# Patient Record
Sex: Female | Born: 1990 | Race: Asian | Hispanic: No | Marital: Married | Smoking: Never smoker
Health system: Southern US, Community
[De-identification: ages and names within clinical notes are randomized; demographics above are authoritative.]

## PROBLEM LIST (undated history)

## (undated) ENCOUNTER — Inpatient Hospital Stay (HOSPITAL_COMMUNITY): Payer: Self-pay

## (undated) DIAGNOSIS — K219 Gastro-esophageal reflux disease without esophagitis: Secondary | ICD-10-CM

## (undated) DIAGNOSIS — E785 Hyperlipidemia, unspecified: Secondary | ICD-10-CM

## (undated) DIAGNOSIS — K649 Unspecified hemorrhoids: Secondary | ICD-10-CM

## (undated) DIAGNOSIS — O24419 Gestational diabetes mellitus in pregnancy, unspecified control: Secondary | ICD-10-CM

## (undated) DIAGNOSIS — F419 Anxiety disorder, unspecified: Secondary | ICD-10-CM

## (undated) DIAGNOSIS — L309 Dermatitis, unspecified: Secondary | ICD-10-CM

## (undated) HISTORY — DX: Hyperlipidemia, unspecified: E78.5

## (undated) HISTORY — PX: BAND HEMORRHOIDECTOMY: SHX1213

## (undated) HISTORY — DX: Gastro-esophageal reflux disease without esophagitis: K21.9

## (undated) HISTORY — DX: Anxiety disorder, unspecified: F41.9

## (undated) HISTORY — DX: Unspecified hemorrhoids: K64.9

## (undated) HISTORY — DX: Dermatitis, unspecified: L30.9

## (undated) HISTORY — PX: ESOPHAGOGASTRODUODENOSCOPY: SHX1529

## (undated) HISTORY — DX: Gestational diabetes mellitus in pregnancy, unspecified control: O24.419

---

## 2011-01-25 ENCOUNTER — Other Ambulatory Visit (HOSPITAL_COMMUNITY): Payer: Self-pay | Admitting: Nurse Practitioner

## 2011-01-25 DIAGNOSIS — Z3689 Encounter for other specified antenatal screening: Secondary | ICD-10-CM

## 2011-01-25 LAB — HEPATITIS B SURFACE ANTIGEN: Hepatitis B Surface Ag: NEGATIVE

## 2011-01-25 LAB — RUBELLA ANTIBODY, IGM: Rubella: IMMUNE

## 2011-01-25 LAB — GC/CHLAMYDIA PROBE AMP, GENITAL
Chlamydia: NEGATIVE
Gonorrhea: NEGATIVE

## 2011-01-25 LAB — RPR: RPR: NONREACTIVE

## 2011-01-25 LAB — ABO/RH: RH Type: POSITIVE

## 2011-01-25 LAB — HIV ANTIBODY (ROUTINE TESTING W REFLEX): HIV: NONREACTIVE

## 2011-01-25 LAB — ANTIBODY SCREEN: Antibody Screen: NEGATIVE

## 2011-02-07 ENCOUNTER — Ambulatory Visit (HOSPITAL_COMMUNITY)
Admission: RE | Admit: 2011-02-07 | Discharge: 2011-02-07 | Disposition: A | Discharge status: Home / Self Care | Payer: Medicaid Other | Source: Ambulatory Visit | Attending: Nurse Practitioner | Admitting: Nurse Practitioner

## 2011-02-07 DIAGNOSIS — Z1389 Encounter for screening for other disorder: Secondary | ICD-10-CM | POA: Insufficient documentation

## 2011-02-07 DIAGNOSIS — Z363 Encounter for antenatal screening for malformations: Secondary | ICD-10-CM | POA: Insufficient documentation

## 2011-02-07 DIAGNOSIS — Z3689 Encounter for other specified antenatal screening: Secondary | ICD-10-CM

## 2011-02-07 DIAGNOSIS — O358XX0 Maternal care for other (suspected) fetal abnormality and damage, not applicable or unspecified: Secondary | ICD-10-CM | POA: Insufficient documentation

## 2011-06-09 LAB — STREP B DNA PROBE: GBS: NEGATIVE

## 2011-07-06 ENCOUNTER — Inpatient Hospital Stay (HOSPITAL_COMMUNITY): Payer: Medicaid Other

## 2011-07-06 ENCOUNTER — Encounter (HOSPITAL_COMMUNITY): Payer: Self-pay | Admitting: *Deleted

## 2011-07-06 ENCOUNTER — Telehealth (HOSPITAL_COMMUNITY): Payer: Self-pay | Admitting: *Deleted

## 2011-07-06 ENCOUNTER — Inpatient Hospital Stay (HOSPITAL_COMMUNITY)
Admission: AD | Admit: 2011-07-06 | Discharge: 2011-07-06 | Disposition: A | Discharge status: Home / Self Care | Payer: Medicaid Other | Source: Ambulatory Visit | Attending: Family Medicine | Admitting: Family Medicine

## 2011-07-06 DIAGNOSIS — IMO0002 Reserved for concepts with insufficient information to code with codable children: Secondary | ICD-10-CM

## 2011-07-06 DIAGNOSIS — O139 Gestational [pregnancy-induced] hypertension without significant proteinuria, unspecified trimester: Secondary | ICD-10-CM | POA: Insufficient documentation

## 2011-07-06 LAB — COMPREHENSIVE METABOLIC PANEL
ALT: 20 U/L (ref 0–35)
AST: 24 U/L (ref 0–37)
Albumin: 3 g/dL — ABNORMAL LOW (ref 3.5–5.2)
Alkaline Phosphatase: 165 U/L — ABNORMAL HIGH (ref 39–117)
BUN: 6 mg/dL (ref 6–23)
CO2: 23 mEq/L (ref 19–32)
Calcium: 9.2 mg/dL (ref 8.4–10.5)
Chloride: 106 mEq/L (ref 96–112)
Creatinine, Ser: 0.4 mg/dL — ABNORMAL LOW (ref 0.50–1.10)
GFR calc Af Amer: >90 mL/min (ref >90)
GFR calc non Af Amer: >90 mL/min (ref >90)
Glucose, Bld: 71 mg/dL (ref 70–99)
Potassium: 3.4 mEq/L — ABNORMAL LOW (ref 3.5–5.1)
Sodium: 140 mEq/L (ref 135–145)
Total Bilirubin: 0.3 mg/dL (ref 0.3–1.2)
Total Protein: 6.4 g/dL (ref 6.0–8.3)

## 2011-07-06 LAB — CBC
HCT: 36.6 % (ref 36.0–46.0)
Hemoglobin: 12 g/dL (ref 12.0–15.0)
MCH: 27.8 pg (ref 26.0–34.0)
MCHC: 32.8 g/dL (ref 30.0–36.0)
MCV: 84.9 fL (ref 78.0–100.0)
Platelets: 187 10*3/uL (ref 150–400)
RBC: 4.31 MIL/uL (ref 3.87–5.11)
RDW: 14.5 % (ref 11.5–15.5)
WBC: 11.9 10*3/uL — ABNORMAL HIGH (ref 4.0–10.5)

## 2011-07-06 LAB — PROTEIN / CREATININE RATIO, URINE
Creatinine, Urine: 50 mg/dL
Protein Creatinine Ratio: 0.13 (ref 0.00–0.15)
Total Protein, Urine: 6.4 mg/dL

## 2011-07-06 NOTE — Discharge Instructions (Signed)
Hypertension During Pregnancy Hypertension is also called high blood pressure. Blood pressure moves blood in your body. Sometimes, the force that moves the blood becomes too strong. When you are pregnant, this condition should be watched carefully. It can cause problems for you and your baby. HOME CARE   Make and keep all of your doctor visits.   Take medicine as told by your doctor. Tell your doctor about all medicines you take.   Eat very little salt.   Exercise regularly.   Do not drink alcohol.   Do not smoke.   Do not have drinks with caffeine.   Lie on your left side when resting.  GET HELP RIGHT AWAY IF:  You have bad belly (abdominal) pain.   You have sudden puffiness (swelling) in the hands, ankles, or face.   You gain 4 pounds (1.8 kilograms) or more in 1 week.   You throw up (vomit) repeatedly.   You have bleeding from the vagina.   You do not feel the baby moving as much.   You have a headache.   You have blurred or double vision.   You have muscle twitching or spasms.   You have shortness of breath.   You have blue fingernails and lips.   You have blood in your pee (urine).  MAKE SURE YOU:  Understand these instructions.   Will watch your condition.   Will get help right away if you are not doing well.  Document Released: 04/08/2010 Document Revised: 02/23/2011 Document Reviewed: 10/21/2010 [employer] Patient Information 2012 [employer]. 

## 2011-07-06 NOTE — MAU Provider Note (Signed)
History     CSN: [id number]  Arrival date and time: 07/06/11 1131   First Provider Initiated Contact with Patient 07/06/11 1201      Chief Complaint  Patient presents with  . Hypertension   HPI 21 year old G1 P0 at 40 weeks and one-day who was sent to the maternity admission unit from the [facility] department due to concerns of pregnancy-induced hypertension. She denies vision changes, headache, right upper quadrant pain, peripheral edema. She reports good fetal activity with no evidence of vaginal bleeding, vaginal discharge, leaking fluid.  OB History    Grav Para Term Preterm Abortions TAB SAB Ect Mult Living   1               Past Medical History  Diagnosis Date  . Eczema     Past Surgical History  Procedure Date  . No past surgeries     No family history on file.  History  Substance Use Topics  . Smoking status: Never Smoker   . Smokeless tobacco: Never Used  . Alcohol Use: No    Allergies: No Known Allergies  Prescriptions prior to admission  Medication Sig Dispense Refill  . Prenatal Vit-Fe Fumarate-FA (PRENATAL MULTIVITAMIN) TABS Take 1 tablet by mouth at bedtime.        Review of Systems  Constitutional: Negative for fever and chills.  Gastrointestinal: Negative for nausea, vomiting and abdominal pain.  Genitourinary: Negative for dysuria, urgency and frequency.   Physical Exam   Blood pressure 123/82, pulse 76, temperature 97.3 F (36.3 C), temperature source Oral, resp. rate 20, height 5\' 1"  (1.549 m), last menstrual period 09/28/2010.  Physical Exam  Constitutional: She is oriented to person, place, and time. She appears well-developed and well-nourished.  GI: Soft. Bowel sounds are normal. She exhibits no distension and no mass. There is no tenderness. There is no rebound and no guarding.  Musculoskeletal: Normal range of motion. She exhibits no edema.  Neurological: She is alert and oriented to person, place, and time. She has  normal reflexes.  Skin: Skin is warm and dry.  Psychiatric: She has a normal mood and affect. Her behavior is normal. Judgment and thought content normal.   Results for orders placed during the hospital encounter of 07/06/11 (from the past 24 hour(s))  PROTEIN / CREATININE RATIO, URINE     Status: Normal   Collection Time   07/06/11 11:50 AM      Component Value Range   Creatinine, Urine 50.00     Total Protein, Urine 6.4     PROTEIN CREATININE RATIO 0.13  0.00 - 0.15   COMPREHENSIVE METABOLIC PANEL     Status: Abnormal   Collection Time   07/06/11 12:20 PM      Component Value Range   Sodium 140  135 - 145 (mEq/L)   Potassium 3.4 (*) 3.5 - 5.1 (mEq/L)   Chloride 106  96 - 112 (mEq/L)   CO2 23  19 - 32 (mEq/L)   Glucose, Bld 71  70 - 99 (mg/dL)   BUN 6  6 - 23 (mg/dL)   Creatinine, Ser 9.14 (*) 0.50 - 1.10 (mg/dL)   Calcium 9.2  8.4 - 78.2 (mg/dL)   Total Protein 6.4  6.0 - 8.3 (g/dL)   Albumin 3.0 (*) 3.5 - 5.2 (g/dL)   AST 24  0 - 37 (U/L)   ALT 20  0 - 35 (U/L)   Alkaline Phosphatase 165 (*) 39 - 117 (U/L)   Total  Bilirubin 0.3  0.3 - 1.2 (mg/dL)   GFR calc non Af Amer >90  >90 (mL/min)   GFR calc Af Amer >90  >90 (mL/min)  CBC     Status: Abnormal   Collection Time   07/06/11 12:20 PM      Component Value Range   WBC 11.9 (*) 4.0 - 10.5 (K/uL)   RBC 4.31  3.87 - 5.11 (MIL/uL)   Hemoglobin 12.0  12.0 - 15.0 (g/dL)   HCT 16.1  09.6 - 04.5 (%)   MCV 84.9  78.0 - 100.0 (fL)   MCH 27.8  26.0 - 34.0 (pg)   MCHC 32.8  30.0 - 36.0 (g/dL)   RDW 40.9  81.1 - 91.4 (%)   Platelets 187  150 - 400 (K/uL)    MAU Course  Procedures NST: category 1 tracing with baseline rate of 120s-130s.  Multiple accelerations seen. BPP: 8/8  MDM Her for almost 2 hours with normal blood pressures. Lab testing was negative for preeclampsia. Patient discharged to home to followup with her provider.  Assessment and Plan  #1 intrauterine pregnancy at 40 weeks and 1 day #2 pregnancy-induced  hypertension-episodic  No evidence of preeclampsia. Followup with primary provider at next scheduled appointment. Discharged to home.  Conswella Bruney JEHIEL 07/06/2011, 1:26 PM

## 2011-07-06 NOTE — Telephone Encounter (Signed)
Preadmission screen [phone]

## 2011-07-06 NOTE — MAU Note (Signed)
Sent from HD, elevated BP.  Denies headache, no pain, no visual changes.  Little swelling in hands and feet.

## 2011-07-12 ENCOUNTER — Encounter (HOSPITAL_COMMUNITY): Payer: Self-pay

## 2011-07-12 ENCOUNTER — Inpatient Hospital Stay (HOSPITAL_COMMUNITY)
Admission: AD | Admit: 2011-07-12 | Discharge: 2011-07-14 | DRG: 775 | Disposition: A | Discharge status: Home / Self Care | Payer: Medicaid Other | Source: Ambulatory Visit | Attending: Obstetrics & Gynecology | Admitting: Obstetrics & Gynecology

## 2011-07-12 ENCOUNTER — Other Ambulatory Visit: Payer: Medicaid Other

## 2011-07-12 DIAGNOSIS — Z3400 Encounter for supervision of normal first pregnancy, unspecified trimester: Secondary | ICD-10-CM

## 2011-07-12 LAB — CBC
HCT: 37 % (ref 36.0–46.0)
Hemoglobin: 12.5 g/dL (ref 12.0–15.0)
MCH: 28.5 pg (ref 26.0–34.0)
MCHC: 33.8 g/dL (ref 30.0–36.0)
MCV: 84.3 fL (ref 78.0–100.0)
Platelets: 202 10*3/uL (ref 150–400)
RBC: 4.39 MIL/uL (ref 3.87–5.11)
RDW: 14.6 % (ref 11.5–15.5)
WBC: 15 10*3/uL — ABNORMAL HIGH (ref 4.0–10.5)

## 2011-07-12 LAB — ABO/RH: ABO/RH(D): O POS

## 2011-07-12 LAB — RPR: RPR Ser Ql: NONREACTIVE

## 2011-07-12 MED ORDER — IBUPROFEN 600 MG PO TABS: 600.0000 mg | ORAL_TABLET | Freq: Four times a day (QID) | ORAL | Status: DC | PRN

## 2011-07-12 MED ORDER — LACTATED RINGERS IV SOLN: 500.0000 mL | Freq: Once | INTRAVENOUS | Status: DC

## 2011-07-12 MED ORDER — PHENYLEPHRINE 40 MCG/ML (10ML) SYRINGE FOR IV PUSH (FOR BLOOD PRESSURE SUPPORT): 80.0000 ug | PREFILLED_SYRINGE | INTRAVENOUS | Status: DC | PRN

## 2011-07-12 MED ORDER — FLEET ENEMA 7-19 GM/118ML RE ENEM: 1.0000 | ENEMA | RECTAL | Status: DC | PRN

## 2011-07-12 MED ORDER — BENZOCAINE-MENTHOL 20-0.5 % EX AERO
1.0000 "application " | INHALATION_SPRAY | CUTANEOUS | Status: DC | PRN
  Administered 2011-07-12: 1 via TOPICAL
  Filled 2011-07-12: qty 56

## 2011-07-12 MED ORDER — LACTATED RINGERS IV SOLN: 500.0000 mL | INTRAVENOUS | Status: DC | PRN

## 2011-07-12 MED ORDER — LANOLIN HYDROUS EX OINT: TOPICAL_OINTMENT | CUTANEOUS | Status: DC | PRN

## 2011-07-12 MED ORDER — OXYCODONE-ACETAMINOPHEN 5-325 MG PO TABS: 1.0000 | ORAL_TABLET | ORAL | Status: DC | PRN

## 2011-07-12 MED ORDER — LIDOCAINE HCL (PF) 1 % IJ SOLN
30.0000 mL | INTRAMUSCULAR | Status: DC | PRN
  Filled 2011-07-12: qty 30

## 2011-07-12 MED ORDER — ONDANSETRON HCL 4 MG/2ML IJ SOLN: 4.0000 mg | Freq: Four times a day (QID) | INTRAMUSCULAR | Status: DC | PRN

## 2011-07-12 MED ORDER — NALBUPHINE SYRINGE 5 MG/0.5 ML
10.0000 mg | INJECTION | INTRAMUSCULAR | Status: DC | PRN
  Administered 2011-07-12 (×2): 10 mg via INTRAVENOUS
  Filled 2011-07-12 (×2): qty 1

## 2011-07-12 MED ORDER — CITRIC ACID-SODIUM CITRATE 334-500 MG/5ML PO SOLN: 30.0000 mL | ORAL | Status: DC | PRN

## 2011-07-12 MED ORDER — OXYCODONE-ACETAMINOPHEN 5-325 MG PO TABS
1.0000 | ORAL_TABLET | ORAL | Status: DC | PRN
  Administered 2011-07-12 – 2011-07-14 (×3): 1 via ORAL
  Filled 2011-07-12 (×2): qty 1

## 2011-07-12 MED ORDER — PRENATAL MULTIVITAMIN CH
1.0000 | ORAL_TABLET | Freq: Every day | ORAL | Status: DC
  Administered 2011-07-12 – 2011-07-14 (×3): 1 via ORAL
  Filled 2011-07-12 (×3): qty 1

## 2011-07-12 MED ORDER — TETANUS-DIPHTH-ACELL PERTUSSIS 5-2.5-18.5 LF-MCG/0.5 IM SUSP: 0.5000 mL | Freq: Once | INTRAMUSCULAR | Status: DC

## 2011-07-12 MED ORDER — WITCH HAZEL-GLYCERIN EX PADS: 1.0000 "application " | MEDICATED_PAD | CUTANEOUS | Status: DC | PRN

## 2011-07-12 MED ORDER — OXYTOCIN BOLUS FROM INFUSION
500.0000 mL | Freq: Once | INTRAVENOUS | Status: DC
  Filled 2011-07-12: qty 1000
  Filled 2011-07-12: qty 500

## 2011-07-12 MED ORDER — SENNOSIDES-DOCUSATE SODIUM 8.6-50 MG PO TABS
2.0000 | ORAL_TABLET | Freq: Every day | ORAL | Status: DC
  Administered 2011-07-12: 2 via ORAL

## 2011-07-12 MED ORDER — DIPHENHYDRAMINE HCL 50 MG/ML IJ SOLN: 12.5000 mg | INTRAMUSCULAR | Status: DC | PRN

## 2011-07-12 MED ORDER — IBUPROFEN 600 MG PO TABS
600.0000 mg | ORAL_TABLET | Freq: Four times a day (QID) | ORAL | Status: DC
  Administered 2011-07-12 – 2011-07-14 (×7): 600 mg via ORAL
  Filled 2011-07-12 (×8): qty 1

## 2011-07-12 MED ORDER — OXYTOCIN 20 UNITS IN LACTATED RINGERS INFUSION - SIMPLE
125.0000 mL/h | Freq: Once | INTRAVENOUS | Status: AC
  Administered 2011-07-12: 500 mL/h via INTRAVENOUS

## 2011-07-12 MED ORDER — EPHEDRINE 5 MG/ML INJ: 10.0000 mg | INTRAVENOUS | Status: DC | PRN

## 2011-07-12 MED ORDER — ONDANSETRON HCL 4 MG/2ML IJ SOLN: 4.0000 mg | INTRAMUSCULAR | Status: DC | PRN

## 2011-07-12 MED ORDER — SIMETHICONE 80 MG PO CHEW
80.0000 mg | CHEWABLE_TABLET | ORAL | Status: DC | PRN
  Administered 2011-07-12: 80 mg via ORAL

## 2011-07-12 MED ORDER — ZOLPIDEM TARTRATE 5 MG PO TABS: 5.0000 mg | ORAL_TABLET | Freq: Every evening | ORAL | Status: DC | PRN

## 2011-07-12 MED ORDER — LACTATED RINGERS IV SOLN
INTRAVENOUS | Status: DC
Start: 2011-07-12 — End: 2011-07-12
  Administered 2011-07-12 (×2): via INTRAVENOUS

## 2011-07-12 MED ORDER — DIPHENHYDRAMINE HCL 25 MG PO CAPS: 25.0000 mg | ORAL_CAPSULE | Freq: Four times a day (QID) | ORAL | Status: DC | PRN

## 2011-07-12 MED ORDER — DIBUCAINE 1 % RE OINT: 1.0000 "application " | TOPICAL_OINTMENT | RECTAL | Status: DC | PRN

## 2011-07-12 MED ORDER — FENTANYL 2.5 MCG/ML BUPIVACAINE 1/10 % EPIDURAL INFUSION (WH - ANES): 14.0000 mL/h | INTRAMUSCULAR | Status: DC

## 2011-07-12 MED ORDER — ONDANSETRON HCL 4 MG PO TABS: 4.0000 mg | ORAL_TABLET | ORAL | Status: DC | PRN

## 2011-07-12 MED ORDER — ACETAMINOPHEN 325 MG PO TABS: 650.0000 mg | ORAL_TABLET | ORAL | Status: DC | PRN

## 2011-07-12 NOTE — Progress Notes (Addendum)
Patient ID: Paula Stark, female   DOB: 1990-07-05, 21 y.o.   MRN: [id number] Delivery Note At  a viable female baby was delivered via  Vertex SVD.  APGAR: 9, 9; weight - not documented for one hour. Check flowsheet.    Placenta status: intact , . 3 veseel Cord:  with the following complications: none.  Cord pH: not drawn.   Anesthesia: none Episiotomy: none Lacerations: none Est. Blood Loss (mL): minimal  Mom to postpartum.  Baby to nursery-stable. Delivered by myself and Maylon Cos Midwife.  [name] 07/12/2011, 3:45 PM

## 2011-07-12 NOTE — Progress Notes (Signed)
Brylei Oelkers is a 21 y.o. G1P0000 at [redacted]w[redacted]d   Subjective: Urge to push with contractions  Objective: BP 147/80  Pulse 86  Temp(Src) 97.3 F (36.3 C) (Axillary)  Resp 20  Ht 5\' 1"  (1.549 m)  Wt 157 lb (71.215 kg)  BMI 29.67 kg/m2  LMP 09/28/2010      FHT:  FHR: 120 bpm, variability: moderate,  accelerations:  Present,  decelerations:  Present occassional with pushing, spontaneous return to BL and accelerations b/t ctx UC:   irregular, every 3-4 minutes SVE:   Dilation: 10 Effacement (%): 100 Station: 0;+1 Exam by:: Lorretta Harp RNC  Active pushing, good maternal effort and good decent of fetal heatd   Labs: Lab Results  Component Value Date   WBC 15.0* 07/12/2011   HGB 12.5 07/12/2011   HCT 37.0 07/12/2011   MCV 84.3 07/12/2011   PLT 202 07/12/2011    Assessment / Plan: Active second stage  Labor: Progressing normally Preeclampsia:  n/a Fetal Wellbeing:  Category I Pain Control:  Labor support without medications I/D:  n/a Anticipated MOD:  NSVD  Jules Vidovich E. 07/12/2011, 3:22 PM

## 2011-07-12 NOTE — MAU Note (Addendum)
Pt states starting having contractions last night and questionable rom v/s loss of mucous plug. Presently is not leaking fluid. Denies any problems with her pregnancy.

## 2011-07-12 NOTE — Progress Notes (Signed)
Paula Stark is a 21 y.o. G1P0000 at [redacted]w[redacted]d  Subjective: Doing well. Pain well controlled w/ Nubain. Still not interested in an epidural   Objective: BP 131/77  Pulse 80  Temp(Src) 97.5 F (36.4 C) (Oral)  Resp 20  Ht 5\' 1"  (1.549 m)  Wt 71.215 kg (157 lb)  BMI 29.67 kg/m2  LMP 09/28/2010      FHT:  FHR: 130s-140s bpm, variability: moderate,  accelerations:  Present,  decelerations:  Absent UC:   regular, every 4-6 minutes SVE:   Dilation: 7.5 Effacement (%): 80 Station: 0 Exam by:: Shelly Flatten, MD  Labs: Lab Results  Component Value Date   WBC 15.0* 07/12/2011   HGB 12.5 07/12/2011   HCT 37.0 07/12/2011   MCV 84.3 07/12/2011   PLT 202 07/12/2011    Assessment / Plan: Spontaneous labor, progressing normally  Labor: Progressing normally Fetal Wellbeing:  Category I Pain Control:  Nubain I/D:  n/a Anticipated MOD:  NSVD  Paula Stark 07/12/2011, 10:37 AM

## 2011-07-12 NOTE — Progress Notes (Signed)
Paula Stark is a 21 y.o. G1P0000 at [redacted]w[redacted]d  Subjective: Doing well. Pain controlled w/ Nubain, still having pain.   Objective: BP 148/94  Pulse 84  Temp(Src) 97.7 F (36.5 C) (Oral)  Resp 22  Ht 5\' 1"  (1.549 m)  Wt 71.215 kg (157 lb)  BMI 29.67 kg/m2  LMP 09/28/2010      FHT:  FHR: 130s-140s bpm, variability: moderate,  accelerations:  Present,  decelerations:  Absent UC:   regular, every 4-6 minutes SVE:   Dilation: 7.5 Effacement (%): 80 Station: 0 Exam by:: Shelly Flatten, MD  Labs: Lab Results  Component Value Date   WBC 15.0* 07/12/2011   HGB 12.5 07/12/2011   HCT 37.0 07/12/2011   MCV 84.3 07/12/2011   PLT 202 07/12/2011    Assessment / Plan: Spontaneous labor, progressing normally  Labor: Progressing normally Fetal Wellbeing:  Category I Pain Control:  Nubain I/D:  n/a Anticipated MOD:  NSVD  Will check again in one hour, possible AROM at that time.   [name] 07/12/2011, 12:28 PM

## 2011-07-12 NOTE — H&P (Signed)
Paula Stark is a 21 y.o. female presenting for eval of labor. Denies leak or bldg. Rec's prenatal care at [facility] and her preg has been essentially unremarkable.   History OB History    Grav Para Term Preterm Abortions TAB SAB Ect Mult Living   1              Past Medical History  Diagnosis Date  . Eczema   . No pertinent past medical history    Past Surgical History  Procedure Date  . No past surgeries    Family History: family history is not on file. Social History:  reports that she has never smoked. She has never used smokeless tobacco. She reports that she does not drink alcohol or use illicit drugs.  ROS    Last menstrual period 09/28/2010. Maternal Exam:  Uterine Assessment: Ctx q 4-5 min  Cervix: Per MAU RN 3-4cm/100%  Fetal Exam Fetal Monitor Review: Baseline rate: 135.  Variability: moderate (6-25 bpm).   Pattern: accelerations present and no decelerations.       Physical Exam  Constitutional: She is oriented to person, place, and time. She appears well-developed and well-nourished.  HENT:  Head: Normocephalic.  Cardiovascular: Normal rate.   Respiratory: Effort normal.  Neurological: She is alert and oriented to person, place, and time.  Skin: Skin is warm and dry.  Psychiatric: She has a normal mood and affect. Her behavior is normal.    Prenatal labs: ABO, Rh: O/Positive/-- (11/07 0000) Antibody: Negative (11/07 0000) Rubella: Immune (11/07 0000) RPR: Nonreactive (11/07 0000)  HBsAg: Negative (11/07 0000)  HIV: Non-reactive (11/07 0000)  GBS: Negative (03/22 0000)   Assessment/Plan: IUP at term Early labor  Will admit to L&D Expectant management Anticipate SVD   Kenlee Maler 07/12/2011, 2:20 AM

## 2011-07-12 NOTE — Progress Notes (Signed)
Paula Stark is a 21 y.o. G1P0000 at [redacted]w[redacted]d  Subjective: Breathing with ctx; coping well  Objective: BP 128/76  Pulse 74  Temp(Src) 98 F (36.7 C) (Oral)  Resp 20  Ht 5\' 1"  (1.549 m)  Wt 71.215 kg (157 lb)  BMI 29.67 kg/m2  LMP 09/28/2010      FHT:  FHR: 125 bpm, variability: moderate,  accelerations:  Present,  decelerations:  Absent UC:   regular, every 3-5 minutes SVE:   Dilation: 5 Effacement (%): 100 Station: -1 Exam by:: Pincus Badder CNM  Labs: Lab Results  Component Value Date   WBC 15.0* 07/12/2011   HGB 12.5 07/12/2011   HCT 37.0 07/12/2011   MCV 84.3 07/12/2011   PLT 202 07/12/2011    Assessment / Plan: IUP at term Early active labor  Continue to obs Eval cx in approx 2 hours or sooner prn   Geovanni Rahming 07/12/2011, 5:51 AM

## 2011-07-12 NOTE — Progress Notes (Signed)
Transported to room 160

## 2011-07-12 NOTE — Progress Notes (Signed)
Paula Stark is a 21 y.o. G1P0000 at [redacted]w[redacted]d  Subjective: Doing well. Pain controlled w/ Nubain, still having pain.   Objective: BP 148/94  Pulse 84  Temp(Src) 97.7 F (36.5 C) (Oral)  Resp 22  Ht 5\' 1"  (1.549 m)  Wt 71.215 kg (157 lb)  BMI 29.67 kg/m2  LMP 09/28/2010      FHT:  FHR: 130s-140s bpm, variability: moderate,  accelerations:  Present,  decelerations:  Absent UC:   regular, every 4-6 minutes SVE:   Dilation: 8.0 cm Effacement (%): 80 Station: 0 Soft  AROM performed by [name] - light meconium Exam by:: [name]  Labs: Lab Results  Component Value Date   WBC 15.0* 07/12/2011   HGB 12.5 07/12/2011   HCT 37.0 07/12/2011   MCV 84.3 07/12/2011   PLT 202 07/12/2011    Assessment / Plan: Spontaneous labor, progressing normally S/p AROM  Labor: Progressing normally Fetal Wellbeing:  Category I Pain Control:  Nubain I/D:  n/a Anticipated MOD:  NSVD  Will check again in a couple hours.   [name] 07/12/2011, 1:32 PM

## 2011-07-13 MED ORDER — LANOLIN HYDROUS EX OINT: 1.0000 "application " | TOPICAL_OINTMENT | CUTANEOUS | Status: DC | PRN

## 2011-07-13 MED ORDER — IBUPROFEN 600 MG PO TABS: 600.0000 mg | ORAL_TABLET | Freq: Four times a day (QID) | ORAL | Status: AC

## 2011-07-13 MED ORDER — OXYCODONE-ACETAMINOPHEN 5-325 MG PO TABS: 1.0000 | ORAL_TABLET | Freq: Three times a day (TID) | ORAL | Status: DC | PRN

## 2011-07-13 NOTE — Progress Notes (Signed)
Obstetric Discharge Summary Reason for Admission: onset of labor Prenatal Procedures: none Intrapartum Procedures: spontaneous vaginal delivery Postpartum Procedures: none Complications-Operative and Postpartum: none Hemoglobin Date Value Range Status 07/12/2011 12.5  12.0-15.0 (g/dL) Final   HCT Date Value Range Status 07/12/2011 37.0  36.0-46.0 (%) Final   Physical Exam:  General: alert, cooperative and no distress Lochia: appropriate Uterine Fundus: firm Incision: n/a DVT Evaluation: No evidence of DVT seen on physical exam.  Discharge Diagnoses: Term Pregnancy-delivered  Discharge Information: Date: 07/13/2011 Activity: unrestricted Diet: routine Medications: PNV, Ibuprofen, Percocet and lanolin Condition: stable Instructions: refer to practice specific booklet Discharge to: home Follow-up Information   Follow up with [facility]. Schedule an appointment as soon as possible for a visit in 6 weeks.  Contact information:  [address] [place] [address] [phone]      Newborn Data: Live born female  Birth Weight: 7 lb 8.8 oz (3425 g) APGAR: 9, 9  Home with mother.  [name] 07/13/2011, 7:26 AM  I have seen this patient and agree with the above resident's note.  Pt to stay another day for evaluation by lactation consultant because of poor infant feeding.   LEFTWICH-KIRBY, Ebb Carelock Certified Nurse-Midwife

## 2011-07-13 NOTE — Discharge Summary (Deleted)
Obstetric Discharge Summary Reason for Admission: onset of labor Prenatal Procedures: none Intrapartum Procedures: spontaneous vaginal delivery Postpartum Procedures: none Complications-Operative and Postpartum: none Hemoglobin  Date Value Range Status  07/12/2011 12.5  12.0-15.0 (g/dL) Final     HCT  Date Value Range Status  07/12/2011 37.0  36.0-46.0 (%) Final    Physical Exam:  General: alert, cooperative and no distress Lochia: appropriate Uterine Fundus: firm Incision: n/a DVT Evaluation: No evidence of DVT seen on physical exam.  Discharge Diagnoses: Term Pregnancy-delivered  Discharge Information: Date: 07/13/2011 Activity: unrestricted Diet: routine Medications: PNV, Ibuprofen, Percocet and lanolin Condition: stable Instructions: refer to practice specific booklet Discharge to: home Follow-up Information    Follow up with [facility]. Schedule an appointment as soon as possible for a visit in 6 weeks.   Contact information:   [address] [place] [address] [phone]         Newborn Data: Live born female  Birth Weight: 7 lb 8.8 oz (3425 g) APGAR: 9, 9  Home with mother.  [name] 07/13/2011, 7:26 AM

## 2011-07-13 NOTE — Progress Notes (Signed)
UR Chart review completed.  

## 2011-07-14 MED ORDER — OXYCODONE-ACETAMINOPHEN 5-325 MG PO TABS: 1.0000 | ORAL_TABLET | Freq: Three times a day (TID) | ORAL | Status: AC | PRN

## 2011-07-14 MED ORDER — NORETHINDRONE 0.35 MG PO TABS: 1.0000 | ORAL_TABLET | Freq: Every day | ORAL | Status: DC

## 2011-07-14 NOTE — Progress Notes (Signed)
Post Partum Day 2 Subjective: no complaints, up ad lib, voiding and tolerating PO Decreasing vaginal bleeding.  Objective: Blood pressure 131/78, pulse 81, temperature 97.6 F (36.4 C), temperature source Axillary, resp. rate 18, height 5\' 1"  (1.549 m), weight 71.215 kg (157 lb), last menstrual period 09/28/2010, unknown if currently breastfeeding.  Physical Exam:  General: alert and cooperative Lochia: appropriate Uterine Fundus: firm Incision: n/a DVT Evaluation: No evidence of DVT seen on physical exam.   Basename 07/12/11 0230  HGB 12.5  HCT 37.0    Assessment/Plan: Discharge home Breast.    LOS: 2 days   [name] 07/14/2011, 7:22 AM    Patient seen and examined.  Agree with above note.  Candelaria Celeste JEHIEL 07/14/2011 3:26 PM

## 2011-07-14 NOTE — Discharge Summary (Signed)
Obstetric Discharge Summary Reason for Admission: onset of labor Prenatal Procedures: ultrasound Intrapartum Procedures: spontaneous vaginal delivery Postpartum Procedures: none Complications-Operative and Postpartum: none Hemoglobin  Date Value Range Status  07/12/2011 12.5  12.0-15.0 (g/dL) Final     HCT  Date Value Range Status  07/12/2011 37.0  36.0-46.0 (%) Final    Physical Exam:  General: alert and cooperative Lochia: appropriate Uterine Fundus: firm Incision: n/a DVT Evaluation: No evidence of DVT seen on physical exam.  Discharge Diagnoses: Term Pregnancy-delivered  Discharge Information: Date: 07/14/2011 Activity: unrestricted Diet: routine Medications: PNV, Ibuprofen and Percocet Condition: stable Instructions: refer to practice specific booklet Discharge to: home Follow-up Information    Follow up with [facility]. Schedule an appointment as soon as possible for a visit in 6 weeks.   Contact information:   [address] [place] [address] [phone]         Newborn Data: Live born female  Birth Weight: 7 lb 8.8 oz (3425 g) APGAR: 9, 9  Home with mother.  Paula Stark 07/14/2011, 7:23 AM Obstetric Discharge Summary Reason for Admission: onset of labor Prenatal Procedures: none Intrapartum Procedures: spontaneous vaginal delivery Postpartum Procedures: none Complications-Operative and Postpartum: none Hemoglobin  Date Value Range Status  07/12/2011 12.5  12.0-15.0 (g/dL) Final     HCT  Date Value Range Status  07/12/2011 37.0  36.0-46.0 (%) Final    Physical Exam:  General: alert, cooperative and no distress Lochia: appropriate Uterine Fundus: firm Incision: n/a DVT Evaluation: No evidence of DVT seen on physical exam.  Discharge Diagnoses: Term Pregnancy-delivered  Discharge Information: Date: 07/14/2011 Activity: unrestricted Diet: routine Medications: PNV, Ibuprofen, Percocet and lanolin Condition:  stable Instructions: refer to practice specific booklet Discharge to: home Follow-up Information    Follow up with [facility]. Schedule an appointment as soon as possible for a visit in 6 weeks.   Contact information:   [address] [place] [address] [phone]         Newborn Data: Live born female  Birth Weight: 7 lb 8.8 oz (3425 g) APGAR: 9, 9  Home with mother.  Paula Arbour MD 07/14/2011, 7:23 AM  Patient seen and examined.  Agree with above note.  Paula Stark 07/14/2011 3:26 PM

## 2011-07-15 ENCOUNTER — Inpatient Hospital Stay (HOSPITAL_COMMUNITY): Admission: RE | Admit: 2011-07-15 | Payer: Medicaid Other | Source: Ambulatory Visit

## 2011-07-15 ENCOUNTER — Inpatient Hospital Stay (HOSPITAL_COMMUNITY): Admit: 2011-07-15 | Payer: Medicaid Other

## 2013-01-16 ENCOUNTER — Other Ambulatory Visit (HOSPITAL_COMMUNITY): Payer: Self-pay | Admitting: Nurse Practitioner

## 2013-01-16 DIAGNOSIS — Z3689 Encounter for other specified antenatal screening: Secondary | ICD-10-CM

## 2013-01-16 LAB — OB RESULTS CONSOLE GC/CHLAMYDIA
Chlamydia: NEGATIVE
Gonorrhea: NEGATIVE

## 2013-01-16 LAB — OB RESULTS CONSOLE HIV ANTIBODY (ROUTINE TESTING)
HIV: NONREACTIVE
HIV: NONREACTIVE

## 2013-01-16 LAB — OB RESULTS CONSOLE RPR
RPR: NONREACTIVE
RPR: NONREACTIVE

## 2013-01-16 LAB — OB RESULTS CONSOLE HEPATITIS B SURFACE ANTIGEN: Hepatitis B Surface Ag: NEGATIVE

## 2013-01-16 LAB — OB RESULTS CONSOLE RUBELLA ANTIBODY, IGM: Rubella: IMMUNE

## 2013-01-24 ENCOUNTER — Ambulatory Visit (HOSPITAL_COMMUNITY): Admission: RE | Admit: 2013-01-24 | Payer: Medicaid Other | Source: Ambulatory Visit

## 2013-01-24 ENCOUNTER — Ambulatory Visit (HOSPITAL_COMMUNITY)
Admission: RE | Admit: 2013-01-24 | Discharge: 2013-01-24 | Disposition: A | Discharge status: Home / Self Care | Payer: Medicaid Other | Source: Ambulatory Visit | Attending: Nurse Practitioner | Admitting: Nurse Practitioner

## 2013-01-24 ENCOUNTER — Other Ambulatory Visit (HOSPITAL_COMMUNITY): Payer: Self-pay | Admitting: Nurse Practitioner

## 2013-01-24 DIAGNOSIS — Z3689 Encounter for other specified antenatal screening: Secondary | ICD-10-CM

## 2013-03-20 NOTE — L&D Delivery Note (Signed)
Delivery Note At 2:48 PM a viable female was delivered via  (Presentation:OA to LOA).  APGAR 8, 9: weight 8.0#  Placenta status: intact, 3-vessel Cord:    Anesthesia: Epidural  Episiotomy: None Lacerations: None Suture Repair:N/A Est. Blood Loss (mL): 250mL   Mom to postpartum.  Baby to Couplet care / Skin to Skin. CNM Dierdre Christy Gentlesoe was present and supervising the entire delivery.   Michaelene SongHall, Jonathan C 05/22/2013, 3:04 PM  Evaluation and management procedures were performed by Resident physician under my supervision/collaboration. Chart reviewed, patient examined by me and I agree with management and plan.  Danae Orleanseirdre C Roseana Rhine, CNM 05/22/2013 6:00 PM

## 2013-03-20 NOTE — L&D Delivery Note (Signed)
Attestation of Attending Supervision of Advanced Practitioner (CNM/NP): Evaluation and management procedures were performed by the Advanced Practitioner under my supervision and collaboration. I have reviewed the Advanced Practitioner's note and chart, and I agree with the management and plan.  Shirl Weir H. 12:17 PM

## 2013-04-24 LAB — OB RESULTS CONSOLE GBS: GBS: NEGATIVE

## 2013-05-22 ENCOUNTER — Inpatient Hospital Stay (HOSPITAL_COMMUNITY)
Admission: AD | Admit: 2013-05-22 | Discharge: 2013-05-24 | DRG: 775 | Disposition: A | Discharge status: Home / Self Care | Payer: Medicaid Other | Source: Ambulatory Visit | Attending: Obstetrics & Gynecology | Admitting: Obstetrics & Gynecology

## 2013-05-22 ENCOUNTER — Inpatient Hospital Stay (HOSPITAL_COMMUNITY): Payer: Medicaid Other | Admitting: Anesthesiology

## 2013-05-22 ENCOUNTER — Encounter (HOSPITAL_COMMUNITY): Payer: Medicaid Other | Admitting: Anesthesiology

## 2013-05-22 ENCOUNTER — Encounter (HOSPITAL_COMMUNITY): Payer: Self-pay | Admitting: *Deleted

## 2013-05-22 DIAGNOSIS — IMO0001 Reserved for inherently not codable concepts without codable children: Secondary | ICD-10-CM

## 2013-05-22 DIAGNOSIS — O878 Other venous complications in the puerperium: Principal | ICD-10-CM | POA: Diagnosis present

## 2013-05-22 DIAGNOSIS — K649 Unspecified hemorrhoids: Secondary | ICD-10-CM | POA: Diagnosis present

## 2013-05-22 LAB — RPR: RPR Ser Ql: NONREACTIVE

## 2013-05-22 LAB — TYPE AND SCREEN
ABO/RH(D): O POS
Antibody Screen: NEGATIVE

## 2013-05-22 LAB — CBC
HCT: 35.9 % — ABNORMAL LOW (ref 36.0–46.0)
Hemoglobin: 12.1 g/dL (ref 12.0–15.0)
MCH: 27.2 pg (ref 26.0–34.0)
MCHC: 33.7 g/dL (ref 30.0–36.0)
MCV: 80.7 fL (ref 78.0–100.0)
Platelets: 176 10*3/uL (ref 150–400)
RBC: 4.45 MIL/uL (ref 3.87–5.11)
RDW: 14.4 % (ref 11.5–15.5)
WBC: 11.5 10*3/uL — ABNORMAL HIGH (ref 4.0–10.5)

## 2013-05-22 MED ORDER — OXYTOCIN BOLUS FROM INFUSION
500.0000 mL | INTRAVENOUS | Status: DC
  Administered 2013-05-22: 500 mL via INTRAVENOUS

## 2013-05-22 MED ORDER — PRENATAL MULTIVITAMIN CH
1.0000 | ORAL_TABLET | Freq: Every day | ORAL | Status: DC
  Administered 2013-05-23 – 2013-05-24 (×2): 1 via ORAL
  Filled 2013-05-22 (×2): qty 1

## 2013-05-22 MED ORDER — ZOLPIDEM TARTRATE 5 MG PO TABS: 5.0000 mg | ORAL_TABLET | Freq: Every evening | ORAL | Status: DC | PRN

## 2013-05-22 MED ORDER — DIPHENHYDRAMINE HCL 25 MG PO CAPS
25.0000 mg | ORAL_CAPSULE | Freq: Four times a day (QID) | ORAL | Status: DC | PRN
Start: 2013-05-22 — End: 2013-05-24

## 2013-05-22 MED ORDER — LACTATED RINGERS IV SOLN
INTRAVENOUS | Status: DC
  Administered 2013-05-22: 13:00:00 via INTRAVENOUS

## 2013-05-22 MED ORDER — LACTATED RINGERS IV SOLN
500.0000 mL | Freq: Once | INTRAVENOUS | Status: AC
  Administered 2013-05-22: 500 mL via INTRAVENOUS

## 2013-05-22 MED ORDER — OXYCODONE-ACETAMINOPHEN 5-325 MG PO TABS
1.0000 | ORAL_TABLET | ORAL | Status: DC | PRN
  Administered 2013-05-22: 1 via ORAL
  Administered 2013-05-22 – 2013-05-23 (×2): 2 via ORAL
  Administered 2013-05-23: 1 via ORAL
  Administered 2013-05-23: 2 via ORAL
  Administered 2013-05-24: 1 via ORAL
  Filled 2013-05-22: qty 2
  Filled 2013-05-22 (×3): qty 1
  Filled 2013-05-22 (×2): qty 2

## 2013-05-22 MED ORDER — FENTANYL CITRATE 0.05 MG/ML IJ SOLN
100.0000 ug | Freq: Once | INTRAMUSCULAR | Status: AC
  Administered 2013-05-22: 100 ug via INTRAVENOUS

## 2013-05-22 MED ORDER — PHENYLEPHRINE 40 MCG/ML (10ML) SYRINGE FOR IV PUSH (FOR BLOOD PRESSURE SUPPORT)
80.0000 ug | PREFILLED_SYRINGE | INTRAVENOUS | Status: DC | PRN
  Filled 2013-05-22: qty 10
  Filled 2013-05-22: qty 2

## 2013-05-22 MED ORDER — EPHEDRINE 5 MG/ML INJ
10.0000 mg | INTRAVENOUS | Status: DC | PRN
  Filled 2013-05-22: qty 2
  Filled 2013-05-22: qty 4

## 2013-05-22 MED ORDER — EPHEDRINE 5 MG/ML INJ
10.0000 mg | INTRAVENOUS | Status: DC | PRN
  Filled 2013-05-22: qty 2

## 2013-05-22 MED ORDER — WITCH HAZEL-GLYCERIN EX PADS
1.0000 "application " | MEDICATED_PAD | CUTANEOUS | Status: DC | PRN
  Administered 2013-05-22 – 2013-05-23 (×2): 1 via TOPICAL

## 2013-05-22 MED ORDER — LACTATED RINGERS IV SOLN: 500.0000 mL | INTRAVENOUS | Status: DC | PRN

## 2013-05-22 MED ORDER — OXYCODONE-ACETAMINOPHEN 5-325 MG PO TABS: 1.0000 | ORAL_TABLET | ORAL | Status: DC | PRN

## 2013-05-22 MED ORDER — TETANUS-DIPHTH-ACELL PERTUSSIS 5-2.5-18.5 LF-MCG/0.5 IM SUSP: 0.5000 mL | Freq: Once | INTRAMUSCULAR | Status: DC

## 2013-05-22 MED ORDER — SENNOSIDES-DOCUSATE SODIUM 8.6-50 MG PO TABS
2.0000 | ORAL_TABLET | ORAL | Status: DC
  Administered 2013-05-22 – 2013-05-23 (×2): 2 via ORAL
  Filled 2013-05-22 (×2): qty 2

## 2013-05-22 MED ORDER — ACETAMINOPHEN 325 MG PO TABS: 650.0000 mg | ORAL_TABLET | ORAL | Status: DC | PRN

## 2013-05-22 MED ORDER — LIDOCAINE HCL (PF) 1 % IJ SOLN
30.0000 mL | INTRAMUSCULAR | Status: DC | PRN
  Filled 2013-05-22: qty 30

## 2013-05-22 MED ORDER — IBUPROFEN 600 MG PO TABS
600.0000 mg | ORAL_TABLET | Freq: Four times a day (QID) | ORAL | Status: DC
  Administered 2013-05-22 – 2013-05-24 (×7): 600 mg via ORAL
  Filled 2013-05-22 (×7): qty 1

## 2013-05-22 MED ORDER — IBUPROFEN 600 MG PO TABS
600.0000 mg | ORAL_TABLET | Freq: Four times a day (QID) | ORAL | Status: DC | PRN
  Administered 2013-05-22: 600 mg via ORAL
  Filled 2013-05-22: qty 1

## 2013-05-22 MED ORDER — FENTANYL 2.5 MCG/ML BUPIVACAINE 1/10 % EPIDURAL INFUSION (WH - ANES)
14.0000 mL/h | INTRAMUSCULAR | Status: DC | PRN
Start: 2013-05-22 — End: 2013-05-22
  Administered 2013-05-22: 14 mL/h via EPIDURAL
  Filled 2013-05-22: qty 125

## 2013-05-22 MED ORDER — ONDANSETRON HCL 4 MG/2ML IJ SOLN: 4.0000 mg | Freq: Four times a day (QID) | INTRAMUSCULAR | Status: DC | PRN

## 2013-05-22 MED ORDER — PHENYLEPHRINE 40 MCG/ML (10ML) SYRINGE FOR IV PUSH (FOR BLOOD PRESSURE SUPPORT)
80.0000 ug | PREFILLED_SYRINGE | INTRAVENOUS | Status: DC | PRN
  Filled 2013-05-22: qty 2

## 2013-05-22 MED ORDER — FENTANYL CITRATE 0.05 MG/ML IJ SOLN
INTRAMUSCULAR | Status: AC
  Filled 2013-05-22: qty 2

## 2013-05-22 MED ORDER — BUPIVACAINE HCL (PF) 0.25 % IJ SOLN
INTRAMUSCULAR | Status: DC | PRN
  Administered 2013-05-22: 1.5 mL

## 2013-05-22 MED ORDER — DIBUCAINE 1 % RE OINT
1.0000 "application " | TOPICAL_OINTMENT | RECTAL | Status: DC | PRN
  Administered 2013-05-23: 1 via RECTAL
  Filled 2013-05-22: qty 28

## 2013-05-22 MED ORDER — ONDANSETRON HCL 4 MG PO TABS: 4.0000 mg | ORAL_TABLET | ORAL | Status: DC | PRN

## 2013-05-22 MED ORDER — ONDANSETRON HCL 4 MG/2ML IJ SOLN: 4.0000 mg | INTRAMUSCULAR | Status: DC | PRN

## 2013-05-22 MED ORDER — BENZOCAINE-MENTHOL 20-0.5 % EX AERO
1.0000 "application " | INHALATION_SPRAY | CUTANEOUS | Status: DC | PRN
  Administered 2013-05-23: 1 via TOPICAL
  Filled 2013-05-22 (×2): qty 56

## 2013-05-22 MED ORDER — LANOLIN HYDROUS EX OINT: TOPICAL_OINTMENT | CUTANEOUS | Status: DC | PRN

## 2013-05-22 MED ORDER — CITRIC ACID-SODIUM CITRATE 334-500 MG/5ML PO SOLN
30.0000 mL | ORAL | Status: DC | PRN
Start: 2013-05-22 — End: 2013-05-24

## 2013-05-22 MED ORDER — SIMETHICONE 80 MG PO CHEW: 80.0000 mg | CHEWABLE_TABLET | ORAL | Status: DC | PRN

## 2013-05-22 MED ORDER — OXYTOCIN 40 UNITS IN LACTATED RINGERS INFUSION - SIMPLE MED
62.5000 mL/h | INTRAVENOUS | Status: DC
  Filled 2013-05-22: qty 1000

## 2013-05-22 MED ORDER — DIPHENHYDRAMINE HCL 50 MG/ML IJ SOLN: 12.5000 mg | INTRAMUSCULAR | Status: DC | PRN

## 2013-05-22 NOTE — H&P (Signed)
Paula Stark is a 23 y.o. female G2P1001 at 40.1wks presenting in active labor.  [facility], essentially uncomplicated . Maternal Medical History:  Reason for admission: Nausea.   Contractions: Onset was 6-12 hours ago.   Frequency: regular.   Duration is approximately 90 seconds.   Perceived severity is strong.    Fetal activity: Perceived fetal activity is normal.   Last perceived fetal movement was within the past hour.    Prenatal complications: No bleeding, hypertension or polyhydramnios.   Prenatal Complications - Diabetes: none.    OB History   Grav Para Term Preterm Abortions TAB SAB Ect Mult Living   2 1 1  0 0 0 0 0 0 1     Past Medical History  Diagnosis Date  . Eczema   . No pertinent past medical history   . Medical history non-contributory    Past Surgical History  Procedure Laterality Date  . No past surgeries     Family History: family history is not on file. Social History:  reports that she has never smoked. She has never used smokeless tobacco. She reports that she does not drink alcohol or use illicit drugs.   Prenatal Transfer Tool  Maternal Diabetes: No Genetic Screening: Normal Maternal Ultrasounds/Referrals: Normal Fetal Ultrasounds or other Referrals:  None Maternal Substance Abuse:  No Significant Maternal Medications:  None Significant Maternal Lab Results:  Lab values include: Group B Strep negative Other Comments:  None  Review of Systems  Constitutional: Negative for fever.  Respiratory: Negative for cough.   Gastrointestinal: Positive for nausea and abdominal pain. Negative for vomiting.  Musculoskeletal: Negative for back pain.  Neurological: Negative for headaches.  Psychiatric/Behavioral: Negative for depression.    Dilation: 10 Effacement (%): 100 Station: -1 Exam by:: L. Paschal RN Blood pressure 123/83, pulse 78, temperature 98.1 F (36.7 C), temperature source Oral, resp. rate 18, height 5\' 2"  (1.575 m), weight 80.74 kg  (178 lb), unknown if currently breastfeeding. Maternal Exam:  Uterine Assessment: Contraction strength is firm.  Contraction duration is 60 seconds. Contraction frequency is regular.   Abdomen: Estimated fetal weight is EFW 8#.   Fetal presentation: vertex  Introitus: Normal vulva. Normal vagina.  Vagina is negative for discharge.  Ferning test: not done.  Nitrazine test: not done. Amniotic fluid character: not assessed.  Pelvis: adequate for delivery.   Cervix: Cervix evaluated by digital exam.     Fetal Exam Fetal Monitor Review: Mode: ultrasound.   Baseline rate: 120.  Variability: moderate (6-25 bpm).   Pattern: no decelerations and accelerations present.    Fetal State Assessment: Category I - tracings are normal.     Physical Exam  Constitutional: She is oriented to person, place, and time. She appears well-developed and well-nourished. She appears distressed.  HENT:  Head: Normocephalic.  Eyes: Pupils are equal, round, and reactive to light.  Neck: Normal range of motion.  Cardiovascular: Normal rate, regular rhythm and normal heart sounds.   Respiratory: Effort normal and breath sounds normal.  GI: There is no tenderness.  Genitourinary: Vagina normal. No vaginal discharge found.  Musculoskeletal: Normal range of motion.  Neurological: She is alert and oriented to person, place, and time. She has normal reflexes.  Skin: Skin is warm and dry.  Psychiatric: She has a normal mood and affect. Her behavior is normal. Judgment and thought content normal.    Prenatal labs: ABO, Rh:  Opos Antibody:  neg Rubella:  immune RPR:   NR HBsAg:   neg  HIV:   NR GBS:   neg 1 hr 122  Assessment/Plan: 23 yo G2P1001 in advanced labor at term Fentanyl IV with some relief> plan AROM and SVD   POE,DEIRDRE 05/22/2013, 11:49 AM

## 2013-05-22 NOTE — MAU Note (Signed)
Pt C/O uc's since 0530, small amount of bleeding, denies LOF.

## 2013-05-22 NOTE — Anesthesia Preprocedure Evaluation (Signed)

## 2013-05-22 NOTE — Lactation Note (Signed)
This note was copied from the chart of Paula Stark. Lactation Consultation Note  Patient Name: Paula Dennard NipMah Hermans RUEAV'WToday's Date: 05/22/2013 Reason for consult: Initial assessment;Other (Comment) (attempt to clarify mom's feeding choice) Mom states that she breastfed her older child for 1 year but is "unsure" whether she will breastfeed this new baby.  She declined to breastfeed after delivery and has fed bottle w/formula, stating she "does not want to breastfeed while in hospital."  LC reviewed LEAD cautions regarding early supplementation and delay in breastfeeding and mom acknowledges the information LC provided [facility] Resource brochure and reviewed [facility] services and list of community and web site resources. . Maternal Data Does the patient have breastfeeding experience prior to this delivery?: Yes  Feeding    LATCH Score/Interventions            N/A - mom has not offered breast and not sure if she will breastfeed          Lactation Tools Discussed/Used   [facility] services and resources  Consult Status Consult Status: PRN Date: 05/23/13 Follow-up type: Other (comment) (check with nurse to see if mom has decided to breastfeed)    Paula Stark, Paula Stark 05/22/2013, 10:27 PM

## 2013-05-22 NOTE — Progress Notes (Signed)
Patient ID: Paula Stark, female   DOB: 08/12/1990, 23 y.o.   MRN: [id number] Paula Stark is a 23 y.o. G2P1001 at 9456w1d admitted for term labor  Subjective: Coping with UCs  Objective: BP 123/83  Pulse 78  Temp(Src) 98.1 F (36.7 C) (Oral)  Resp 20  Ht 5\' 2"  (1.575 m)  Wt 80.74 kg (178 lb)  BMI 32.55 kg/m2  Fetal Heart FHR: 125 bpm, variability: moderate,  accelerations:  Present,  decelerations:  Absent   Contractions: q 2-653min  SVE:   Dilation: 10 Effacement (%): 100 Station: -1 Exam by:: L. Paschal RN Dilation: 10 Dilation Complete Date: 05/22/13 Dilation Complete Time: 1101 Effacement (%): 100 Cervical Position: Middle Station: 0 Presentation: Vertex Exam by:: Severus Brodzinski. CNM    Assessment / Plan:  Labor: passive second stage Fetal Wellbeing: [place] 1 Pain Control:  IV Fentanyl given, epidural offerred Expected mode of delivery: NSVD  Paula Stark 05/22/2013, 11:59 AM

## 2013-05-22 NOTE — Anesthesia Procedure Notes (Signed)
Epidural Patient location during procedure: OB  Preanesthetic Checklist Completed: patient identified, site marked, surgical consent, pre-op evaluation, timeout performed, IV checked, risks and benefits discussed and monitors and equipment checked  Epidural Patient position: sitting Prep: site prepped and draped and DuraPrep Patient monitoring: continuous pulse ox and blood pressure Approach: midline Injection technique: LOR air  Needle:  Needle type: Tuohy  Needle gauge: 17 G Needle length: 9 cm and 9 Needle insertion depth: 6 cm Catheter type: closed end flexible Catheter size: 19 Gauge Catheter at skin depth: 12 cm Test dose: negative  Assessment Events: blood not aspirated, injection not painful, no injection resistance, negative IV test and no paresthesia  Additional Notes Dosed as CSE (-) asp CSF/Heme

## 2013-05-23 NOTE — Progress Notes (Signed)
UR completed 

## 2013-05-23 NOTE — Lactation Note (Signed)
This note was copied from the chart of Paula Stark. Lactation Consultation Note  Patient Name: Paula Dennard NipMah Garin OZHYQ'MToday's Date: 05/23/2013 Reason for consult: Follow-up assessment now that mom has decided that she wants to breastfeed but is asking for assistance to latch.  Baby was fed formula by bottle an hour ago so LC encouraged mom to call when baby showing hunger cues.  LC discussed how hand expression may entice baby to open wide for latch to breast and LC also suggested she try offering a few sucks of formula from bottle prior to latch attempts if baby fussy due to previous bottle-feedings.  LC recommends STS and cue feedings.   Maternal Data    Feeding Feeding Type: Bottle Fed - Formula Nipple Type: Slow - flow  LATCH Score/Interventions         No latch yet             Lactation Tools Discussed/Used   STS, hand expression, cue feedings  Consult Status Consult Status: Follow-up Date: 05/24/13 Follow-up type: In-patient    Warrick ParisianBryant, Zonie Crutcher [facility] 05/23/2013, 7:15 PM

## 2013-05-23 NOTE — Anesthesia Postprocedure Evaluation (Signed)
Anesthesia Post Note  Patient: Paula Stark  Procedure(s) Performed: * No procedures listed *  Anesthesia type: Epidural  Patient location: Mother/Baby  Post pain: Pain level controlled  Post assessment: Post-op Vital signs reviewed  Last Vitals:  Filed Vitals:   05/23/13 0522  BP: 117/71  Pulse: 75  Temp: 36.3 C  Resp: 20    Post vital signs: Reviewed  Level of consciousness:alert  Complications: No apparent anesthesia complications

## 2013-05-23 NOTE — Progress Notes (Addendum)
Post Partum Day 1 Subjective: up ad lib, voiding, tolerating PO, + flatus and BM. Complaining of hemorrhoids  Objective: Blood pressure 117/71, pulse 75, temperature 97.4 F (36.3 C), temperature source Oral, resp. rate 20, height 5\' 2"  (1.575 m), weight 80.74 kg (178 lb), SpO2 100.00%, unknown if currently breastfeeding.  Physical Exam:  General: alert, cooperative and no distress Lochia: appropriate Uterine Fundus: firm Incision: N/A DVT Evaluation: No evidence of DVT seen on physical exam.   Recent Labs  05/22/13 1120  HGB 12.1  HCT 35.9*    Assessment/Plan: Discharge tomorrow, Breast and bottle feeding, Lactation consult and Contraception desires Nexplanon   LOS: 1 day   Michaelene SongHall, Jonathan C 05/23/2013, 7:57 AM   I spoke with and examined patient and agree with resident's note and plan of care.  Tawana ScaleMichael Ryan Tasnia Spegal, MD OB Fellow 05/23/2013 8:55 AM

## 2013-05-24 MED ORDER — IBUPROFEN 600 MG PO TABS: 600.0000 mg | ORAL_TABLET | Freq: Four times a day (QID) | ORAL | Status: DC | PRN

## 2013-05-24 MED ORDER — PHENYLEPHRINE IN HARD FAT 0.25 % RE SUPP: 1.0000 | Freq: Two times a day (BID) | RECTAL | Status: DC

## 2013-05-24 MED ORDER — DOCUSATE SODIUM 100 MG PO CAPS: 100.0000 mg | ORAL_CAPSULE | Freq: Two times a day (BID) | ORAL | Status: DC

## 2013-05-24 NOTE — Discharge Instructions (Signed)

## 2013-05-24 NOTE — Discharge Summary (Signed)
  Obstetric Discharge Summary Reason for Admission: onset of labor Prenatal Procedures: none Intrapartum Procedures: spontaneous vaginal delivery Postpartum Procedures: none Complications-Operative and Postpartum: none  Hospital Course: Admitted on 3/5 in advanced labor, progressed to have a  Vaginal delivery as below. No complications. PP complains of hemorrhoids but otherwise meeting milestones. Pt is breast and bottle feeding. WIll be discharged home without issue. Pt is using nexplanon for contraception and will follow up at the HD.   Delivery Note At 2:48 PM a viable female was delivered via  (Presentation:OA to LOA).  APGAR 8, 9: weight 8.0#  Placenta status: intact, 3-vessel Cord:    Anesthesia: Epidural  Episiotomy: None Lacerations: None Suture Repair:N/A Est. Blood Loss (mL): 250mL   Mom to postpartum.  Baby to Couplet care / Skin to Skin. CNM Paula Stark was present and supervising the entire delivery.   Paula Stark, Paula Stark 05/22/2013, 3:04 PM  Evaluation and management procedures were performed by Resident physician under my supervision/collaboration. Chart reviewed, patient examined by me and I agree with management and plan.  Paula Stark, CNM 05/22/2013 6:00 PM     H/H: Lab Results  Component Value Date/Time   HGB 12.1 05/22/2013 11:20 AM   HCT 35.9* 05/22/2013 11:20 AM    Physical Exam: Abd: Appropriately tender, ND, Fundus @U -1 Incision: NA No Stark/Stark/e, Neg homan's sign, neg cords Lochia Appropriate  Discharge Diagnoses: Term Pregnancy-delivered  Discharge Information: Date: 09/29/2010 Activity: pelvic rest Diet: routine  Medications: PNV, Ibuprofen, Colace and preperation H Breast feeding:  Yes Condition: stable Instructions: refer to handout Discharge to: home       Medication List         docusate sodium 100 MG capsule  Commonly known as:  COLACE  Take 1 capsule (100 mg total) by mouth 2 (two) times daily.     ibuprofen 600 MG tablet   Commonly known as:  ADVIL,MOTRIN  Take 1 tablet (600 mg total) by mouth every 6 (six) hours as needed (pain scale < 4).     phenylephrine 0.25 % suppository  Commonly known as:  (USE for PREPARATION-H)  Place 1 suppository rectally 2 (two) times daily.     prenatal multivitamin Tabs tablet  Take 1 tablet by mouth at bedtime.           Follow-up Information   [facility] [place]. Schedule an appointment as soon as possible for a visit in 4 weeks.   Contact information:   [address] [place] [id number] [phone]      Paula Stark 05/24/2013,9:35 AM

## 2013-05-26 NOTE — H&P (Signed)
Attestation of Attending Supervision of Advanced Practitioner (CNM/NP): Evaluation and management procedures were performed by the Advanced Practitioner under my supervision and collaboration. I have reviewed the Advanced Practitioner's note and chart, and I agree with the management and plan.  Tanya Crothers H. 12:16 PM

## 2013-05-29 ENCOUNTER — Encounter (HOSPITAL_COMMUNITY): Payer: Self-pay | Admitting: *Deleted

## 2013-05-29 ENCOUNTER — Encounter (INDEPENDENT_AMBULATORY_CARE_PROVIDER_SITE_OTHER): Payer: Self-pay | Admitting: General Surgery

## 2013-05-29 ENCOUNTER — Ambulatory Visit (INDEPENDENT_AMBULATORY_CARE_PROVIDER_SITE_OTHER): Payer: Medicaid Other | Admitting: General Surgery

## 2013-05-29 ENCOUNTER — Inpatient Hospital Stay (HOSPITAL_COMMUNITY)
Admission: AD | Admit: 2013-05-29 | Discharge: 2013-05-29 | Disposition: A | Discharge status: Home / Self Care | Payer: Medicaid Other | Source: Ambulatory Visit | Attending: Obstetrics & Gynecology | Admitting: Obstetrics & Gynecology

## 2013-05-29 VITALS — BP 120/84 | HR 80 | Temp 98.2°F | Resp 16 | Ht 62.0 in | Wt 166.6 lb

## 2013-05-29 DIAGNOSIS — K649 Unspecified hemorrhoids: Secondary | ICD-10-CM

## 2013-05-29 DIAGNOSIS — IMO0002 Reserved for concepts with insufficient information to code with codable children: Secondary | ICD-10-CM | POA: Diagnosis present

## 2013-05-29 DIAGNOSIS — K5900 Constipation, unspecified: Secondary | ICD-10-CM | POA: Insufficient documentation

## 2013-05-29 DIAGNOSIS — L259 Unspecified contact dermatitis, unspecified cause: Secondary | ICD-10-CM | POA: Insufficient documentation

## 2013-05-29 DIAGNOSIS — K644 Residual hemorrhoidal skin tags: Secondary | ICD-10-CM | POA: Insufficient documentation

## 2013-05-29 DIAGNOSIS — O878 Other venous complications in the puerperium: Secondary | ICD-10-CM | POA: Insufficient documentation

## 2013-05-29 MED ORDER — OXYCODONE-ACETAMINOPHEN 5-325 MG PO TABS
1.0000 | ORAL_TABLET | Freq: Once | ORAL | Status: AC
  Administered 2013-05-29: 1 via ORAL
  Filled 2013-05-29: qty 1

## 2013-05-29 MED ORDER — LIDOCAINE HCL 2 % EX GEL
CUTANEOUS | Status: AC
  Administered 2013-05-29: 5 via TOPICAL
  Filled 2013-05-29: qty 5

## 2013-05-29 MED ORDER — PROMETHAZINE HCL 25 MG PO TABS
12.5000 mg | ORAL_TABLET | Freq: Once | ORAL | Status: AC
  Administered 2013-05-29: 12.5 mg via ORAL
  Filled 2013-05-29: qty 1

## 2013-05-29 MED ORDER — LIDOCAINE 5 % EX OINT: 1.0000 "application " | TOPICAL_OINTMENT | CUTANEOUS | Status: DC | PRN

## 2013-05-29 NOTE — Discharge Instructions (Signed)
Hemorrhoids Hemorrhoids are swollen veins around the rectum or anus. There are two types of hemorrhoids:   Internal hemorrhoids. These occur in the veins just inside the rectum. They may poke through to the outside and become irritated and painful.  External hemorrhoids. These occur in the veins outside the anus and can be felt as a painful swelling or hard lump near the anus. CAUSES  Pregnancy.   Obesity.   Constipation or diarrhea.   Straining to have a bowel movement.   Sitting for long periods on the toilet.  Heavy lifting or other activity that caused you to strain.  Anal intercourse. SYMPTOMS   Pain.   Anal itching or irritation.   Rectal bleeding.   Fecal leakage.   Anal swelling.   One or more lumps around the anus.  DIAGNOSIS  Your caregiver may be able to diagnose hemorrhoids by visual examination. Other examinations or tests that may be performed include:   Examination of the rectal area with a gloved hand (digital rectal exam).   Examination of anal canal using a small tube (scope).   A blood test if you have lost a significant amount of blood.  A test to look inside the colon (sigmoidoscopy or colonoscopy). TREATMENT Most hemorrhoids can be treated at home. However, if symptoms do not seem to be getting better or if you have a lot of rectal bleeding, your caregiver may perform a procedure to help make the hemorrhoids get smaller or remove them completely. Possible treatments include:   Placing a rubber band at the base of the hemorrhoid to cut off the circulation (rubber band ligation).   Injecting a chemical to shrink the hemorrhoid (sclerotherapy).   Using a tool to burn the hemorrhoid (infrared light therapy).   Surgically removing the hemorrhoid (hemorrhoidectomy).   Stapling the hemorrhoid to block blood flow to the tissue (hemorrhoid stapling).  HOME CARE INSTRUCTIONS   Eat foods with fiber, such as whole grains, beans,  nuts, fruits, and vegetables. Ask your doctor about taking products with added fiber in them (fibersupplements).  Increase fluid intake. Drink enough water and fluids to keep your urine clear or pale yellow.   Exercise regularly.   Go to the bathroom when you have the urge to have a bowel movement. Do not wait.   Avoid straining to have bowel movements.   Keep the anal area dry and clean. Use wet toilet paper or moist towelettes after a bowel movement.   Medicated creams and suppositories may be used or applied as directed.   Only take over-the-counter or prescription medicines as directed by your caregiver.   Take warm sitz baths for 15 20 minutes, 3 4 times a day to ease pain and discomfort.   Place ice packs on the hemorrhoids if they are tender and swollen. Using ice packs between sitz baths may be helpful.   Put ice in a plastic bag.   Place a towel between your skin and the bag.   Leave the ice on for 15 20 minutes, 3 4 times a day.   Do not use a donut-shaped pillow or sit on the toilet for long periods. This increases blood pooling and pain.  SEEK MEDICAL CARE IF:  You have increasing pain and swelling that is not controlled by treatment or medicine.  You have uncontrolled bleeding.  You have difficulty or you are unable to have a bowel movement.  You have pain or inflammation outside the area of the hemorrhoids. MAKE SURE YOU:    Understand these instructions.  Will watch your condition.  Will get help right away if you are not doing well or get worse. Document Released: 03/03/2000 Document Revised: 02/21/2012 Document Reviewed: 01/09/2012 Exit[employer] Patient Information 2014 [employer].  

## 2013-05-29 NOTE — MAU Note (Signed)
Patient states she had a vaginal delivery on 3-5. States she has had hemorrhoids since her first pregnancy but now has a protruding hemorrhoid that is painful.

## 2013-05-29 NOTE — MAU Note (Signed)
Delivered vaginally on March 5th; c/o hemorrhoid pain;

## 2013-05-29 NOTE — Patient Instructions (Signed)

## 2013-05-29 NOTE — Progress Notes (Signed)
Chief Complaint  Patient presents with  . Follow-up    thromboseed hem    HISTORY:  Paula Stark is a 23 y.o. female who presents to the office with anal pain after childbirth last Thu.  She states that the pain is starting to get slightly better. She denies any constipation or straining since childbirth. She denies any bleeding.   Past Medical History  Diagnosis Date  . Eczema   . No pertinent past medical history   . Medical history non-contributory        Past Surgical History  Procedure Laterality Date  . No past surgeries        Current Outpatient Prescriptions  Medication Sig Dispense Refill  . docusate sodium (COLACE) 100 MG capsule Take 100 mg by mouth 2 (two) times daily.      Marland Kitchen. OVER THE COUNTER MEDICATION       . Witch Hazel (PREPARATION H EX) Apply 1 application topically as needed (for hemrrhoids).      Marland Kitchen. ibuprofen (ADVIL,MOTRIN) 600 MG tablet Take 600 mg by mouth every 6 (six) hours as needed for moderate pain (pain scale < 4).      . lidocaine (XYLOCAINE) 5 % ointment Apply 1 application topically as needed.  35.44 g  0   No current facility-administered medications for this visit.   Facility-Administered Medications Ordered in Other Visits  Medication Dose Route Frequency Provider Last Rate Last Dose  . bupivacaine (PF) (MARCAINE) 0.25 % injection    Anesthesia Intra-op Cristela BlueKyle Jackson, MD   1.5 mL at 05/22/13 1310     No Known Allergies    Family History  Problem Relation Age of Onset  . Alcohol abuse Neg Hx   . Arthritis Neg Hx   . Asthma Neg Hx   . Birth defects Neg Hx   . Cancer Neg Hx   . COPD Neg Hx   . Diabetes Neg Hx   . Depression Neg Hx   . Drug abuse Neg Hx   . Early death Neg Hx   . Hearing loss Neg Hx   . Heart disease Neg Hx   . Hyperlipidemia Neg Hx   . Hypertension Neg Hx   . Kidney disease Neg Hx   . Learning disabilities Neg Hx   . Mental illness Neg Hx   . Mental retardation Neg Hx   . Miscarriages / Stillbirths Neg Hx   . Stroke  Neg Hx   . Vision loss Neg Hx   . Varicose Veins Neg Hx       History   Social History  . Marital Status: Married    Spouse Name: N/A    Number of Children: N/A  . Years of Education: N/A   Social History Main Topics  . Smoking status: Never Smoker   . Smokeless tobacco: Never Used  . Alcohol Use: No  . Drug Use: No  . Sexual Activity: Yes   Other Topics Concern  . None   Social History Narrative  . None       REVIEW OF SYSTEMS - PERTINENT POSITIVES ONLY: Review of Systems - General ROS: negative for - chills or fever Respiratory ROS: negative for - cough or shortness of breath Cardiovascular ROS: no chest pain or dyspnea on exertion Gastrointestinal ROS: negative for - abdominal pain, blood in stools or constipation Genito-Urinary ROS: no dysuria, trouble voiding, or hematuria  EXAM: Filed Vitals:   05/29/13 1555  BP: 120/84  Pulse: 80  Temp: 98.2 F (  36.8 C)  Resp: 16    General appearance: alert and cooperative Resp: clear to auscultation bilaterally Cardio: regular rate and rhythm GI: normal findings: soft, non-tender Perianal: She has a small inflamed hemorrhoid anteriorly. There does not seem to be any active thrombosis.   ASSESSMENT AND PLAN: Paula Stark Is a 23 year old female who presents to the office after childbirth last Thursday. She is having some anal pain That is slowly getting better. On exam I do not see anything that needs to be resected. I have recommended sitz bath 3-4 times a day. I have also given her a prescription for lidocaine ointment. I will see her back as needed.    Vanita Panda, MD Colon and Rectal Surgery / General Surgery [facility]      Visit Diagnoses: 1. Hemorrhoids     Primary Care Physician: No primary provider on file.

## 2013-05-29 NOTE — MAU Provider Note (Signed)
History    CSN: [id number]  Arrival date and time: 05/29/13 [id number]   Chief Complaint  Patient presents with  . Hemorrhoids   HPI  Pt is a 23 yo G2P2, 7 day postpartum female presenting with hemorrhoids. Pt states that she has been experiencing hemorrhoids throughout her pregnancy and for the past week since her SVD, she has noticed increased hemorrhoidal pain. Pt has noticed no blood in her stool, however is unsure if the area is bleeding because she is still experiencing bleeding due to her recent delivery. She has noticed some "white stuff" on the TP however. Pt has been constipated, noting hard stools despite using stool softener. She has used preparation H and sitz baths but states these has been ineffective.  Past Medical History  Diagnosis Date  . Eczema   . No pertinent past medical history   . Medical history non-contributory     Past Surgical History  Procedure Laterality Date  . No past surgeries      Family History  Problem Relation Age of Onset  . Alcohol abuse Neg Hx   . Arthritis Neg Hx   . Asthma Neg Hx   . Birth defects Neg Hx   . Cancer Neg Hx   . COPD Neg Hx   . Diabetes Neg Hx   . Depression Neg Hx   . Drug abuse Neg Hx   . Early death Neg Hx   . Hearing loss Neg Hx   . Heart disease Neg Hx   . Hyperlipidemia Neg Hx   . Hypertension Neg Hx   . Kidney disease Neg Hx   . Learning disabilities Neg Hx   . Mental illness Neg Hx   . Mental retardation Neg Hx   . Miscarriages / Stillbirths Neg Hx   . Stroke Neg Hx   . Vision loss Neg Hx   . Varicose Veins Neg Hx     History  Substance Use Topics  . Smoking status: Never Smoker   . Smokeless tobacco: Never Used  . Alcohol Use: No    Allergies: No Known Allergies  Prescriptions prior to admission  Medication Sig Dispense Refill  . docusate sodium (COLACE) 100 MG capsule Take 100 mg by mouth 2 (two) times daily.      Marland Kitchen. ibuprofen (ADVIL,MOTRIN) 600 MG tablet Take 600 mg by mouth every 6 (six)  hours as needed for moderate pain (pain scale < 4).      Weyman Croon. Witch Hazel (PREPARATION H EX) Apply 1 application topically as needed (for hemrrhoids).      . [DISCONTINUED] docusate sodium (COLACE) 100 MG capsule Take 1 capsule (100 mg total) by mouth 2 (two) times daily.  60 capsule  0  . [DISCONTINUED] ibuprofen (ADVIL,MOTRIN) 600 MG tablet Take 1 tablet (600 mg total) by mouth every 6 (six) hours as needed (pain scale < 4).  30 tablet  0    Review of Systems  Constitutional: Negative for fever and chills.  Gastrointestinal: Positive for constipation. Negative for abdominal pain and blood in stool.   Physical Exam   Blood pressure 123/91, pulse 80, temperature 98.4 F (36.9 C), resp. rate 20, SpO2 100.00%, unknown if currently breastfeeding.  Physical Exam  Constitutional: She appears well-developed and well-nourished. No distress.  Genitourinary:  1 cm right anterior external non-thrombosed hemorrhoid. Slight border of fluctuance    MAU Course  Procedures Dr Macon LargeAnyanwu to assess patient  Arthur HolmsWinkel, Bradley T PA-S 05/29/2013, 11:40 AM   NP Attestation: I have  seen and evaluated patient and agree with the note above by the PA-Student.  Plan to be decided by Dr. Roque Lias, NP 05/29/2013 1:31 PM    Attestation of Attending Supervision of Advanced Practitioner (PA/CNM/NP): Evaluation and management procedures were performed by the Advanced Practitioner under my supervision and collaboration.  I have reviewed the Advanced Practitioner's note and chart, and I agree with the management and plan.  On exam, patient was noted to have a 1 cm inflamed and extremely tender external hemorrhoid; looks to have a purulent collection on the top of the hemorrhoid.  Xylocaine jelly ordered and applied.  Talked to a provider at urgent Care who informed me that [facility] has urgent care clinic appointments. [facility] was called, was able to get appointment for  patient to be seen by a colorectal surgeon today at 3:30pm. Patient will follow up with the surgeon's recommendations. Patient was discharged from MAU; advised to keep appointment as scheduled and return to the ED for any worsening symptoms.  I really appreciate the help of [facility] and [facility] in taking care of our patient.   Jaynie Collins, MD, FACOG Attending Obstetrician & Gynecologist Faculty Practice, [facility] [place]

## 2013-06-13 ENCOUNTER — Telehealth (INDEPENDENT_AMBULATORY_CARE_PROVIDER_SITE_OTHER): Payer: Self-pay | Admitting: *Deleted

## 2013-06-13 NOTE — Telephone Encounter (Signed)
Pharmacy called to request a refill on the Lidocaine 5% ointment for the patient.  Explained that I will send a message to Dr. Maisie Paula Stark to make her aware then we will let them know once she has made a decision.

## 2013-06-17 ENCOUNTER — Encounter (HOSPITAL_COMMUNITY): Payer: Self-pay | Admitting: *Deleted

## 2013-06-17 ENCOUNTER — Other Ambulatory Visit (HOSPITAL_COMMUNITY): Payer: Self-pay | Admitting: Family Medicine

## 2013-06-17 ENCOUNTER — Inpatient Hospital Stay (HOSPITAL_COMMUNITY)
Admission: AD | Admit: 2013-06-17 | Discharge: 2013-06-17 | Disposition: A | Discharge status: Home / Self Care | Payer: Medicaid Other | Source: Ambulatory Visit | Attending: Obstetrics & Gynecology | Admitting: Obstetrics & Gynecology

## 2013-06-17 DIAGNOSIS — K649 Unspecified hemorrhoids: Secondary | ICD-10-CM | POA: Insufficient documentation

## 2013-06-17 DIAGNOSIS — O878 Other venous complications in the puerperium: Secondary | ICD-10-CM | POA: Insufficient documentation

## 2013-06-17 MED ORDER — HYDROCORTISONE ACE-PRAMOXINE 1-1 % RE CREA: 1.0000 "application " | TOPICAL_CREAM | Freq: Two times a day (BID) | RECTAL | Status: DC

## 2013-06-17 MED ORDER — DOCUSATE SODIUM 100 MG PO CAPS: 100.0000 mg | ORAL_CAPSULE | Freq: Two times a day (BID) | ORAL | Status: DC

## 2013-06-17 NOTE — Discharge Instructions (Signed)
Hemorrhoids °Hemorrhoids are swollen veins around the rectum or anus. There are two types of hemorrhoids:  °· Internal hemorrhoids. These occur in the veins just inside the rectum. They may poke through to the outside and become irritated and painful. °· External hemorrhoids. These occur in the veins outside the anus and can be felt as a painful swelling or hard lump near the anus. °CAUSES °· Pregnancy.   °· Obesity.   °· Constipation or diarrhea.   °· Straining to have a bowel movement.   °· Sitting for long periods on the toilet. °· Heavy lifting or other activity that caused you to strain. °· Anal intercourse. °SYMPTOMS  °· Pain.   °· Anal itching or irritation.   °· Rectal bleeding.   °· Fecal leakage.   °· Anal swelling.   °· One or more lumps around the anus.   °DIAGNOSIS  °Your caregiver may be able to diagnose hemorrhoids by visual examination. Other examinations or tests that may be performed include:  °· Examination of the rectal area with a gloved hand (digital rectal exam).   °· Examination of anal canal using a small tube (scope).   °· A blood test if you have lost a significant amount of blood. °· A test to look inside the colon (sigmoidoscopy or colonoscopy). °TREATMENT °Most hemorrhoids can be treated at home. However, if symptoms do not seem to be getting better or if you have a lot of rectal bleeding, your caregiver may perform a procedure to help make the hemorrhoids get smaller or remove them completely. Possible treatments include:  °· Placing a rubber band at the base of the hemorrhoid to cut off the circulation (rubber band ligation).   °· Injecting a chemical to shrink the hemorrhoid (sclerotherapy).   °· Using a tool to burn the hemorrhoid (infrared light therapy).   °· Surgically removing the hemorrhoid (hemorrhoidectomy).   °· Stapling the hemorrhoid to block blood flow to the tissue (hemorrhoid stapling).   °HOME CARE INSTRUCTIONS  °· Eat foods with fiber, such as whole grains, beans,  nuts, fruits, and vegetables. Ask your doctor about taking products with added fiber in them (fiber supplements). °· Increase fluid intake. Drink enough water and fluids to keep your urine clear or pale yellow.   °· Exercise regularly.   °· Go to the bathroom when you have the urge to have a bowel movement. Do not wait.   °· Avoid straining to have bowel movements.   °· Keep the anal area dry and clean. Use wet toilet paper or moist towelettes after a bowel movement.   °· Medicated creams and suppositories may be used or applied as directed.   °· Only take over-the-counter or prescription medicines as directed by your caregiver.   °· Take warm sitz baths for 15 20 minutes, 3 4 times a day to ease pain and discomfort.   °· Place ice packs on the hemorrhoids if they are tender and swollen. Using ice packs between sitz baths may be helpful.   °· Put ice in a plastic bag.   °· Place a towel between your skin and the bag.   °· Leave the ice on for 15 20 minutes, 3 4 times a day.   °· Do not use a donut-shaped pillow or sit on the toilet for long periods. This increases blood pooling and pain.   °SEEK MEDICAL CARE IF: °· You have increasing pain and swelling that is not controlled by treatment or medicine. °· You have uncontrolled bleeding. °· You have difficulty or you are unable to have a bowel movement. °· You have pain or inflammation outside the area of the hemorrhoids. °MAKE SURE YOU: °·   Understand these instructions.  Will watch your condition.  Will get help right away if you are not doing well or get worse. Document Released: 03/03/2000 Document Revised: 02/21/2012 Document Reviewed: 01/09/2012 [facility] Patient Information 2014 [place], [place].  Fiber Content in Foods Drinking plenty of fluids and consuming foods high in fiber can help with constipation. See the list below for the fiber content of some common foods. Starches and Grains / Dietary Fiber (g)  Cheerios, 1 cup / 3 g  Kellogg's Corn  Flakes, 1 cup / 0.7 g  Rice Krispies, 1  cup / 0.3 g  Quaker Oat Life Cereal,  cup / 2.1 g  Oatmeal, instant (cooked),  cup / 2 g  Kellogg's Frosted Mini Wheats, 1 cup / 5.1 g  Rice, brown, long-grain (cooked), 1 cup / 3.5 g  Rice, white, long-grain (cooked), 1 cup / 0.6 g  Macaroni, cooked, enriched, 1 cup / 2.5 g Legumes / Dietary Fiber (g)  Beans, baked, canned, plain or vegetarian,  cup / 5.2 g  Beans, kidney, canned,  cup / 6.8 g  Beans, pinto, dried (cooked),  cup / 7.7 g  Beans, pinto, canned,  cup / 5.5 g Breads and Crackers / Dietary Fiber (g)  Graham crackers, plain or honey, 2 squares / 0.7 g  Saltine crackers, 3 squares / 0.3 g  Pretzels, plain, salted, 10 pieces / 1.8 g  Bread, whole-wheat, 1 slice / 1.9 g  Bread, white, 1 slice / 0.7 g  Bread, raisin, 1 slice / 1.2 g  Bagel, plain, 3 oz / 2 g  Tortilla, flour, 1 oz / 0.9 g  Tortilla, corn, 1 small / 1.5 g  Bun, hamburger or hotdog, 1 small / 0.9 g Fruits / Dietary Fiber (g)  Apple, raw with skin, 1 medium / 4.4 g  Applesauce, sweetened,  cup / 1.5 g  Banana,  medium / 1.5 g  Grapes, 10 grapes / 0.4 g  Orange, 1 small / 2.3 g  Raisin, 1.5 oz / 1.6 g  Melon, 1 cup / 1.4 g Vegetables / Dietary Fiber (g)  Green beans, canned,  cup / 1.3 g  Carrots (cooked),  cup / 2.3 g  Broccoli (cooked),  cup / 2.8 g  Peas, frozen (cooked),  cup / 4.4 g  Potatoes, mashed,  cup / 1.6 g  Lettuce, 1 cup / 0.5 g  Corn, canned,  cup / 1.6 g  Tomato,  cup / 1.1 g Document Released: 07/23/2006 Document Revised: 05/29/2011 Document Reviewed: 09/17/2006 ExitCare Patient Information 2014 [place], [place].

## 2013-06-17 NOTE — MAU Note (Signed)
Patient states she has had hemorrhoids. Has seen a MD that gave her Lidocain and was told she did not need surgery. States she is out of the medication. Denies bleeding but has pain on the inside of the rectum.

## 2013-06-17 NOTE — MAU Note (Signed)
Delivered 3 weeks ago vaginally; seen here before for this problem; needs something for internal hemorrhoid pain;

## 2013-06-17 NOTE — Telephone Encounter (Signed)
Ok to refill 

## 2013-06-17 NOTE — Telephone Encounter (Signed)
Refill called to [facility] Lidocaine 5% Patient aware

## 2013-06-17 NOTE — MAU Provider Note (Signed)
History     CSN: [id number]  Arrival date and time: 06/17/13 1318   None     Chief Complaint  Patient presents with  . Hemorrhoids   HPI  Paula Stark is a 23 year old with 3 weeks postpartum with ongoing hemorrhoids presenting for persistent rectal pain. The patient has had symptomatic non-thrombosed hemorrhoids throughout her pregnancy. Dr. Romie Stark saw the patient about 2.5 weeks ago and managed her medically with IBU, docusate and lidocaine 5% cream. Her symptoms were improving until about a week ago, the patient ran out of her lidocaine cream and has been experiencing anal pain since. The pain is worst while sitting but also aggravated by walking and straining. The patient did go to [facility] to refill her prescription but it had not been done yet. She presents today hoping to get relief for her pain and refills for her medications.  3/31: Dr. Romie Stark called in a refill for lidocaine cream and patient received a call while in MAU that the refill was ready.    OB History   Grav Para Term Preterm Abortions TAB SAB Ect Mult Living   2 2 2  0 0 0 0 0 0 2      Past Medical History  Diagnosis Date  . Eczema   . No pertinent past medical history   . Medical history non-contributory     Past Surgical History  Procedure Laterality Date  . No past surgeries      Family History  Problem Relation Age of Onset  . Alcohol abuse Neg Hx   . Arthritis Neg Hx   . Asthma Neg Hx   . Birth defects Neg Hx   . Cancer Neg Hx   . COPD Neg Hx   . Diabetes Neg Hx   . Depression Neg Hx   . Drug abuse Neg Hx   . Early death Neg Hx   . Hearing loss Neg Hx   . Heart disease Neg Hx   . Hyperlipidemia Neg Hx   . Hypertension Neg Hx   . Kidney disease Neg Hx   . Learning disabilities Neg Hx   . Mental illness Neg Hx   . Mental retardation Neg Hx   . Miscarriages / Stillbirths Neg Hx   . Stroke Neg Hx   . Vision loss Neg Hx   . Varicose Veins Neg Hx     History  Substance  Use Topics  . Smoking status: Never Smoker   . Smokeless tobacco: Never Used  . Alcohol Use: No    Allergies: No Known Allergies  Prescriptions prior to admission  Medication Sig Dispense Refill  . docusate sodium (COLACE) 100 MG capsule Take 100 mg by mouth 2 (two) times daily.      Marland Kitchen ibuprofen (ADVIL,MOTRIN) 600 MG tablet Take 600 mg by mouth every 6 (six) hours as needed for moderate pain (pain scale < 4).      . lidocaine (XYLOCAINE) 5 % ointment Apply 1 application topically as needed.  35.44 g  0  . phenylephrine-shark liver oil-mineral oil-petrolatum (PREPARATION H) 0.25-3-14-71.9 % rectal ointment Place 1 application rectally 4 (four) times daily as needed for hemorrhoids.       No results found for this or any previous visit (from the past 48 hour(s)).  Review of Systems  Constitutional: Negative for fever and chills.  Eyes: Negative for blurred vision.  Cardiovascular: Negative for chest pain and leg swelling.  Gastrointestinal: Positive for constipation (admits some stools  and sometimes straining). Negative for nausea, vomiting, abdominal pain, diarrhea and blood in stool.       History of anterior non-thrombosed hemorrhoids.  Genitourinary: Negative.   Musculoskeletal: Negative for myalgias.  Skin: Negative for rash.  Neurological: Negative for dizziness and headaches.   Physical Exam   Blood pressure 118/84, pulse 77, resp. rate 18, SpO2 100.00%, unknown if currently breastfeeding.  Physical Exam  Constitutional: She is oriented to person, place, and time. She appears well-developed and well-nourished. No distress.  Cardiovascular: Normal rate and regular rhythm.   Respiratory: Effort normal and breath sounds normal.  GI: Soft. Bowel sounds are normal. She exhibits no distension and no mass. There is no tenderness. There is no rebound and no guarding.  Neurological: She is alert and oriented to person, place, and time.  Skin: Skin is warm and dry. No rash noted.     MAU Course  Procedures None  MDM Patient will be discharged and advised to contact Dr. Romie LeveeAlicia Stark for follow up.  Assessment and Plan  Paula Stark is a 23 year old with 3 weeks postpartum with ongoing hemorrhoids presenting for persistent rectal pain after her lidocaine cream ran out. A prescription refill has been called in to her pharmacy and the patient is now aware. Her IBU and docusate were also refilled here. Patient is to follow up with Dr. Romie LeveeAlicia Stark regarding her hemorrhoids.  P: Discharge home in stable condition.   RX: anal pram        Colace  Pt is to call Dr. Maisie Stark this week if symptoms are not improving.   Wallis BambergMani, Mario 06/17/2013, 4:21 PM   Evaluation and management procedures were performed by the PA student under my supervision and collaboration. I have reviewed the note and chart, and I agree with the management and plan  Iona HansenJennifer Irene Denaisha Swango, NP 06/17/2013 7:35 PM

## 2013-06-18 NOTE — MAU Provider Note (Signed)
Attestation of Attending Supervision of Advanced Practitioner (PA/CNM/NP): Evaluation and management procedures were performed by the Advanced Practitioner under my supervision and collaboration.  I have reviewed the Advanced Practitioner's note and chart, and I agree with the management and plan.  Hakop Humbarger, MD, FACOG Attending Obstetrician & Gynecologist Faculty Practice, [facility]  

## 2013-06-19 ENCOUNTER — Telehealth (INDEPENDENT_AMBULATORY_CARE_PROVIDER_SITE_OTHER): Payer: Self-pay

## 2013-06-19 ENCOUNTER — Other Ambulatory Visit (INDEPENDENT_AMBULATORY_CARE_PROVIDER_SITE_OTHER): Payer: Self-pay | Admitting: General Surgery

## 2013-06-19 DIAGNOSIS — K649 Unspecified hemorrhoids: Secondary | ICD-10-CM

## 2013-06-19 MED ORDER — HYDROCORTISONE ACETATE 25 MG RE SUPP: 25.0000 mg | Freq: Two times a day (BID) | RECTAL | Status: DC | PRN

## 2013-06-19 NOTE — Telephone Encounter (Signed)
Patient states she has internal hems and the Lidocaine is not relieving the pain,she is also doing soaks that "is not helping that much" Advised her I will inform DR. Maisie Fushomas and call her today.

## 2013-06-19 NOTE — Telephone Encounter (Addendum)
Patient is aware of rx called to pharmacy per Dr Maisie Fushomas

## 2014-01-19 ENCOUNTER — Encounter (HOSPITAL_COMMUNITY): Payer: Self-pay | Admitting: *Deleted

## 2014-03-30 ENCOUNTER — Ambulatory Visit: Payer: Medicaid Other | Attending: Internal Medicine

## 2015-03-21 NOTE — L&D Delivery Note (Signed)
Delivery Note At  a viable female was delivered via  (presentation: LOA ;  ).  APGAR: 8/9  , ; weight 3960g  .   Placenta status: delivered intact.  Cord: three vessel   with the following complications: none  Anesthesia:  epidural Episiotomy:  none Lacerations:  abrasions not requiring repair Suture Repair: n/a Est. Blood Loss (mL):    Mom to postpartum.  Baby to Couplet care / Skin to Skin.  Clearance Cootsndrew Tyson 02/29/2016, 6:57 PM   OB FELLOW DELIVERY ATTESTATION  I was gloved and present for the delivery in its entirety, and I agree with the above resident's note.    Jen MowElizabeth Mumaw, DO OB Fellow 02/29/16  7:43 PM

## 2015-03-25 ENCOUNTER — Encounter (HOSPITAL_COMMUNITY): Payer: Self-pay | Admitting: *Deleted

## 2015-03-25 ENCOUNTER — Inpatient Hospital Stay (HOSPITAL_COMMUNITY)
Admission: AD | Admit: 2015-03-25 | Discharge: 2015-03-25 | Disposition: A | Discharge status: Home / Self Care | Payer: Medicaid Other | Source: Ambulatory Visit | Attending: Family Medicine | Admitting: Family Medicine

## 2015-03-25 ENCOUNTER — Inpatient Hospital Stay (HOSPITAL_COMMUNITY): Payer: Medicaid Other

## 2015-03-25 DIAGNOSIS — O418X10 Other specified disorders of amniotic fluid and membranes, first trimester, not applicable or unspecified: Secondary | ICD-10-CM

## 2015-03-25 DIAGNOSIS — O3680X0 Pregnancy with inconclusive fetal viability, not applicable or unspecified: Secondary | ICD-10-CM

## 2015-03-25 DIAGNOSIS — O209 Hemorrhage in early pregnancy, unspecified: Secondary | ICD-10-CM | POA: Insufficient documentation

## 2015-03-25 DIAGNOSIS — N939 Abnormal uterine and vaginal bleeding, unspecified: Secondary | ICD-10-CM | POA: Diagnosis present

## 2015-03-25 DIAGNOSIS — O200 Threatened abortion: Secondary | ICD-10-CM | POA: Diagnosis not present

## 2015-03-25 DIAGNOSIS — Z3A01 Less than 8 weeks gestation of pregnancy: Secondary | ICD-10-CM | POA: Diagnosis not present

## 2015-03-25 DIAGNOSIS — O468X1 Other antepartum hemorrhage, first trimester: Secondary | ICD-10-CM

## 2015-03-25 LAB — CBC
HCT: 36.6 % (ref 36.0–46.0)
Hemoglobin: 12.3 g/dL (ref 12.0–15.0)
MCH: 28.3 pg (ref 26.0–34.0)
MCHC: 33.6 g/dL (ref 30.0–36.0)
MCV: 84.3 fL (ref 78.0–100.0)
Platelets: 250 10*3/uL (ref 150–400)
RBC: 4.34 MIL/uL (ref 3.87–5.11)
RDW: 13.1 % (ref 11.5–15.5)
WBC: 9.6 10*3/uL (ref 4.0–10.5)

## 2015-03-25 LAB — URINE MICROSCOPIC-ADD ON: WBC, UA: NONE SEEN WBC/hpf (ref 0–5)

## 2015-03-25 LAB — URINALYSIS, ROUTINE W REFLEX MICROSCOPIC
Bilirubin Urine: NEGATIVE
Glucose, UA: NEGATIVE mg/dL
Ketones, ur: NEGATIVE mg/dL
Leukocytes, UA: NEGATIVE
Nitrite: NEGATIVE
Protein, ur: NEGATIVE mg/dL
Specific Gravity, Urine: 1.025 (ref 1.005–1.030)
pH: 5.5 (ref 5.0–8.0)

## 2015-03-25 LAB — WET PREP, GENITAL
Clue Cells Wet Prep HPF POC: NONE SEEN
Sperm: NONE SEEN
Trich, Wet Prep: NONE SEEN
Yeast Wet Prep HPF POC: NONE SEEN

## 2015-03-25 LAB — HCG, QUANTITATIVE, PREGNANCY: hCG, Beta Chain, Quant, S: 9203 m[IU]/mL — ABNORMAL HIGH (ref <5)

## 2015-03-25 LAB — POCT PREGNANCY, URINE: Preg Test, Ur: POSITIVE — AB

## 2015-03-25 NOTE — MAU Provider Note (Signed)
History     CSN: [id number]  Arrival date and time: 03/25/15 1036   First Provider Initiated Contact with Patient 03/25/15 1158      Chief Complaint  Patient presents with  . Vaginal Bleeding   HPI   Ms.Paula Stark is a 25 y.o. female G3P2002 at 5574w4d presenting to MAU with vaginal bleeding. She is receiving her care at the HD. When she woke up this morning she noted a brown, vaginal discharge. This is the first time she had bleeding with this pregnancy. Currently the bleeding is none; she notices it only when she wipes.  She is having mild, lower abdominal cramping that comes and goes.   OB History    Gravida Para Term Preterm AB TAB SAB Ectopic Multiple Living   3 2 2  0 0 0 0 0 0 2      Past Medical History  Diagnosis Date  . Eczema   . No pertinent past medical history   . Medical history non-contributory     Past Surgical History  Procedure Laterality Date  . No past surgeries      Family History  Problem Relation Age of Onset  . Alcohol abuse Neg Hx   . Arthritis Neg Hx   . Asthma Neg Hx   . Birth defects Neg Hx   . Cancer Neg Hx   . COPD Neg Hx   . Diabetes Neg Hx   . Depression Neg Hx   . Drug abuse Neg Hx   . Early death Neg Hx   . Hearing loss Neg Hx   . Heart disease Neg Hx   . Hyperlipidemia Neg Hx   . Hypertension Neg Hx   . Kidney disease Neg Hx   . Learning disabilities Neg Hx   . Mental illness Neg Hx   . Mental retardation Neg Hx   . Miscarriages / Stillbirths Neg Hx   . Stroke Neg Hx   . Vision loss Neg Hx   . Varicose Veins Neg Hx     Social History  Substance Use Topics  . Smoking status: Never Smoker   . Smokeless tobacco: Never Used  . Alcohol Use: No    Allergies: No Known Allergies  Prescriptions prior to admission  Medication Sig Dispense Refill Last Dose  . docusate sodium (COLACE) 100 MG capsule Take 1 capsule (100 mg total) by mouth 2 (two) times daily. 20 capsule 1   . hydrocortisone (ANUSOL-HC) 25 MG suppository Place  1 suppository (25 mg total) rectally 2 (two) times daily as needed for hemorrhoids or itching. 24 suppository 1   . ibuprofen (ADVIL,MOTRIN) 600 MG tablet Take 600 mg by mouth every 6 (six) hours as needed for moderate pain (pain scale < 4).   06/17/2013 at Unknown time  . lidocaine (XYLOCAINE) 5 % ointment Apply 1 application topically as needed. 35.44 g 0 Past Week at Unknown time  . phenylephrine-shark liver oil-mineral oil-petrolatum (PREPARATION H) 0.25-3-14-71.9 % rectal ointment Place 1 application rectally 4 (four) times daily as needed for hemorrhoids.   06/17/2013 at Unknown time  . pramoxine-hydrocortisone (ANALPRAM-HC) 1-1 % rectal cream Place 1 application rectally 2 (two) times daily. 30 g 0    Results for orders placed or performed during the hospital encounter of 03/25/15 (from the past 48 hour(s))  Urinalysis, Routine w reflex microscopic ([facility])     Status: Abnormal   Collection Time: 03/25/15 10:45 AM  Result Value Ref Range   Color, Urine YELLOW YELLOW  APPearance CLEAR CLEAR   Specific Gravity, Urine 1.025 1.005 - 1.030   pH 5.5 5.0 - 8.0   Glucose, UA NEGATIVE NEGATIVE mg/dL   Hgb urine dipstick MODERATE (A) NEGATIVE   Bilirubin Urine NEGATIVE NEGATIVE   Ketones, ur NEGATIVE NEGATIVE mg/dL   Protein, ur NEGATIVE NEGATIVE mg/dL   Nitrite NEGATIVE NEGATIVE   Leukocytes, UA NEGATIVE NEGATIVE  Urine microscopic-add on     Status: Abnormal   Collection Time: 03/25/15 10:45 AM  Result Value Ref Range   Squamous Epithelial / LPF 0-5 (A) NONE SEEN   WBC, UA NONE SEEN 0 - 5 WBC/hpf   RBC / HPF 0-5 0 - 5 RBC/hpf   Bacteria, UA RARE (A) NONE SEEN  Pregnancy, urine POC     Status: Abnormal   Collection Time: 03/25/15 10:59 AM  Result Value Ref Range   Preg Test, Ur POSITIVE (A) NEGATIVE    Comment:        THE SENSITIVITY OF THIS METHODOLOGY IS >24 mIU/mL   CBC     Status: None   Collection Time: 03/25/15 11:30 AM  Result Value Ref Range   WBC 9.6 4.0 - 10.5 K/uL    RBC 4.34 3.87 - 5.11 MIL/uL   Hemoglobin 12.3 12.0 - 15.0 g/dL   HCT 40.9 81.1 - 91.4 %   MCV 84.3 78.0 - 100.0 fL   MCH 28.3 26.0 - 34.0 pg   MCHC 33.6 30.0 - 36.0 g/dL   RDW 78.2 95.6 - 21.3 %   Platelets 250 150 - 400 K/uL  hCG, quantitative, pregnancy     Status: Abnormal   Collection Time: 03/25/15 11:30 AM  Result Value Ref Range   hCG, Beta Chain, Quant, S 9203 (H) <5 mIU/mL    Comment:          GEST. AGE      CONC.  (mIU/mL)   <=1 WEEK        5 - 50     2 WEEKS       50 - 500     3 WEEKS       100 - 10,000     4 WEEKS     1,000 - 30,000     5 WEEKS     3,500 - 115,000   6-8 WEEKS     12,000 - 270,000    12 WEEKS     15,000 - 220,000        FEMALE AND NON-PREGNANT FEMALE:     LESS THAN 5 mIU/mL   Wet prep, genital     Status: Abnormal   Collection Time: 03/25/15 12:06 PM  Result Value Ref Range   Yeast Wet Prep HPF POC NONE SEEN NONE SEEN   Trich, Wet Prep NONE SEEN NONE SEEN   Clue Cells Wet Prep HPF POC NONE SEEN NONE SEEN   WBC, Wet Prep HPF POC MANY (A) NONE SEEN    Comment: MANY BACTERIA SEEN   Sperm NONE SEEN     Review of Systems  Constitutional: Negative for fever and chills.  Gastrointestinal: Positive for abdominal pain. Negative for nausea, vomiting, diarrhea and constipation.  Genitourinary: Positive for dysuria. Negative for urgency.   Physical Exam   Blood pressure 120/85, pulse 85, temperature 98.4 F (36.9 C), temperature source Oral, resp. rate 18, height 5\' 1"  (1.549 m), weight 164 lb 3.2 oz (74.481 kg), last menstrual period 02/14/2015, unknown if currently breastfeeding.  Physical Exam  Constitutional: She is oriented to  person, place, and time. She appears well-developed and well-nourished. No distress.  HENT:  Head: Normocephalic.  Eyes: Pupils are equal, round, and reactive to light.  Neck: Neck supple.  Respiratory: Effort normal.  GI: Soft. Normal appearance. There is tenderness in the periumbilical area and suprapubic area. There  is no rigidity, no rebound and no guarding.  Genitourinary:  Speculum exam: Vagina - Small amount of dark brown blood noted in the vagina  Cervix - No contact bleeding, no active bleeding  Bimanual exam: Cervix closed Uterus non tender, normal size Adnexa non tender, no masses bilaterally GC/Chlam, wet prep done Chaperone present for exam.  Musculoskeletal: Normal range of motion.  Neurological: She is alert and oriented to person, place, and time.  Skin: Skin is warm. She is not diaphoretic.  Psychiatric: Her behavior is normal.    MAU Course  Procedures  None  MDM  O positive blood type Urine culture pending   Assessment and Plan   A:  1. Pregnancy of unknown anatomic location   2. Vaginal bleeding in pregnancy, first trimester   3. Threatened miscarriage in early pregnancy   4. Subchorionic hematoma in first trimester     P:  Discharge home in stable condition Bleeding precautions Pelvic rest Return to MAU if symptoms worsen Return in 48 hours for follow up beta hcg level   Duane Lope, NP 03/25/2015 1:12 PM

## 2015-03-25 NOTE — Discharge Instructions (Signed)

## 2015-03-25 NOTE — MAU Note (Signed)
Pt stated had a positive HPT last week. Stared bleeding 2 days ago and retested her self and it was still positive. Mild abd cramping

## 2015-03-26 LAB — CULTURE, OB URINE
Culture: 1000
Special Requests: NORMAL

## 2015-03-26 LAB — HIV ANTIBODY (ROUTINE TESTING W REFLEX): HIV Screen 4th Generation wRfx: NONREACTIVE

## 2015-03-26 LAB — GC/CHLAMYDIA PROBE AMP (~~LOC~~) NOT AT ARMC
Chlamydia: NEGATIVE
Neisseria Gonorrhea: NEGATIVE

## 2015-03-27 ENCOUNTER — Inpatient Hospital Stay (HOSPITAL_COMMUNITY)
Admission: AD | Admit: 2015-03-27 | Discharge: 2015-03-27 | Disposition: A | Discharge status: Home / Self Care | Payer: Medicaid Other | Source: Ambulatory Visit | Attending: Family Medicine | Admitting: Family Medicine

## 2015-03-27 DIAGNOSIS — O9989 Other specified diseases and conditions complicating pregnancy, childbirth and the puerperium: Secondary | ICD-10-CM | POA: Diagnosis not present

## 2015-03-27 DIAGNOSIS — N939 Abnormal uterine and vaginal bleeding, unspecified: Secondary | ICD-10-CM | POA: Diagnosis not present

## 2015-03-27 DIAGNOSIS — O209 Hemorrhage in early pregnancy, unspecified: Secondary | ICD-10-CM

## 2015-03-27 DIAGNOSIS — O3680X0 Pregnancy with inconclusive fetal viability, not applicable or unspecified: Secondary | ICD-10-CM

## 2015-03-27 DIAGNOSIS — Z3A01 Less than 8 weeks gestation of pregnancy: Secondary | ICD-10-CM | POA: Insufficient documentation

## 2015-03-27 LAB — HCG, QUANTITATIVE, PREGNANCY: hCG, Beta Chain, Quant, S: 13952 m[IU]/mL — ABNORMAL HIGH (ref <5)

## 2015-03-27 MED ORDER — PRENATAL VITAMINS PLUS 27-1 MG PO TABS: 1.0000 | ORAL_TABLET | Freq: Every day | ORAL | Status: DC

## 2015-03-27 NOTE — Discharge Instructions (Signed)
Return on Monday afternoon (weather permitting) for repeat quant. Return sooner for heavy bleeding. Pelvic rest - no sex, no tampons, no douching.

## 2015-03-27 NOTE — MAU Note (Signed)
Patient here for follow up BHCG, denies vaginal bleeding, no pain.

## 2015-03-27 NOTE — MAU Provider Note (Signed)
No chief complaint on file.   Subjective:   Pt is a 25 y.o. [id number] here for follow-up BHCG.  Upon review of the records patient was first seen on 03-25-15 for vaginal bleeding.   BHCG on that day was 9203.  Ultrasound showed no IUGS or yolk sac and a moderate subchorionic hemorrhage.  GC/CT and wet prep were collected.  Results were negative.   Pt discharged home 03-25-15.   Pt here today with no report of abdominal pain or vaginal bleeding.   All other systems negative.  Last seen in MAU on 03-25-15.      BHCG was 9203.    Past Medical History  Diagnosis Date  . Eczema   . No pertinent past medical history   . Medical history non-contributory     OB History  Gravida Para Term Preterm AB SAB TAB Ectopic Multiple Living  3 2 2  0 0 0 0 0 0 2    # Outcome Date GA Lbr Len/2nd Weight Sex Delivery Anes PTL Lv  3 Current           2 Term 05/22/13 7267w1d 05:31 / 03:47 8 lb (3.63 kg) M Vag-Spont EPI  Y  1 Term 07/12/11 4656w0d 16:46 / 00:55 7 lb 8.8 oz (3.425 kg) M Vag-Spont None  Y     Comments: none      Family History  Problem Relation Age of Onset  . Alcohol abuse Neg Hx   . Arthritis Neg Hx   . Asthma Neg Hx   . Birth defects Neg Hx   . Cancer Neg Hx   . COPD Neg Hx   . Diabetes Neg Hx   . Depression Neg Hx   . Drug abuse Neg Hx   . Early death Neg Hx   . Hearing loss Neg Hx   . Heart disease Neg Hx   . Hyperlipidemia Neg Hx   . Hypertension Neg Hx   . Kidney disease Neg Hx   . Learning disabilities Neg Hx   . Mental illness Neg Hx   . Mental retardation Neg Hx   . Miscarriages / Stillbirths Neg Hx   . Stroke Neg Hx   . Vision loss Neg Hx   . Varicose Veins Neg Hx     Objective: Physical Exam  Filed Vitals:   03/27/15 1230  BP: 109/72  Pulse: 76  Temp: 98.1 F (36.7 C)  Resp: 18   Constitutional: She is oriented to person, place, and time. She appears well-developed and well-nourished. No distress.  Pulmonary/Chest: Effort normal. No respiratory distress.   Musculoskeletal: Normal range of motion.  Neurological: She is alert and oriented to person, place, and time.  Skin: Skin is warm and dry.   Results for orders placed or performed during the hospital encounter of 03/27/15 (from the past 24 hour(s))  hCG, quantitative, pregnancy     Status: Abnormal   Collection Time: 03/27/15 12:19 PM  Result Value Ref Range   hCG, Beta Chain, Quant, S 13952 (H) <5 mIU/mL     Assessment: 25 y.o. G3P2002 at 3445w6d wks Pregnancy Follow-up BHCG 13952 - rising but has not yet doubled.  Plan: Discussed Sharene Buttersquant is rising but not yet doubled as we would expect.  Needs further evaluation. Return on Monday afternoon (weather permitting) for repeat labs. To return to MAU sooner if she has heavy bleeding. Prescribed prenatal vitamins to her pharmacy Printed a paper copy which was destroyed.

## 2015-03-29 ENCOUNTER — Inpatient Hospital Stay (HOSPITAL_COMMUNITY)
Admission: AD | Admit: 2015-03-29 | Discharge: 2015-03-29 | Disposition: A | Discharge status: Home / Self Care | Payer: Medicaid Other | Source: Ambulatory Visit | Attending: Family Medicine | Admitting: Family Medicine

## 2015-03-29 DIAGNOSIS — Z3A01 Less than 8 weeks gestation of pregnancy: Secondary | ICD-10-CM | POA: Insufficient documentation

## 2015-03-29 DIAGNOSIS — O3680X0 Pregnancy with inconclusive fetal viability, not applicable or unspecified: Secondary | ICD-10-CM

## 2015-03-29 DIAGNOSIS — O209 Hemorrhage in early pregnancy, unspecified: Secondary | ICD-10-CM | POA: Insufficient documentation

## 2015-03-29 LAB — HCG, QUANTITATIVE, PREGNANCY: hCG, Beta Chain, Quant, S: 22194 m[IU]/mL — ABNORMAL HIGH (ref <5)

## 2015-03-29 NOTE — MAU Note (Signed)
Pt here for F/U BHCG.  Pt denies pain, states she had bleeding yesterday, none today.

## 2015-03-29 NOTE — Discharge Instructions (Signed)
Ectopic Pregnancy °An ectopic pregnancy is when the fertilized egg attaches (implants) outside the uterus. Most ectopic pregnancies occur in the fallopian tube. Rarely do ectopic pregnancies occur on the ovary, intestine, pelvis, or cervix. In an ectopic pregnancy, the fertilized egg does not have the ability to develop into a normal, healthy baby.  °A ruptured ectopic pregnancy is one in which the fallopian tube gets torn or bursts and results in internal bleeding. Often there is intense abdominal pain, and sometimes, vaginal bleeding. Having an ectopic pregnancy can be life threatening. If left untreated, this dangerous condition can lead to a blood transfusion, abdominal surgery, or even death. °CAUSES  °Damage to the fallopian tubes is the suspected cause in most ectopic pregnancies.  °RISK FACTORS °Depending on your circumstances, the risk of having an ectopic pregnancy will vary. The level of risk can be divided into three categories. °High Risk °· You have gone through infertility treatment. °· You have had a previous ectopic pregnancy. °· You have had previous tubal surgery. °· You have had previous surgery to have the fallopian tubes tied (tubal ligation). °· You have tubal problems or diseases. °· You have been exposed to DES. DES is a medicine that was used until 1971 and had effects on babies whose mothers took the medicine. °· You become pregnant while using an intrauterine device (IUD) for birth control.  °Moderate Risk °· You have a history of infertility. °· You have a history of a sexually transmitted infection (STI). °· You have a history of pelvic inflammatory disease (PID). °· You have scarring from endometriosis. °· You have multiple sexual partners. °· You smoke.  °Low Risk °· You have had previous pelvic surgery. °· You use vaginal douching. °· You became sexually active before 27 years of age. °SIGNS AND SYMPTOMS  °An ectopic pregnancy should be suspected in anyone who has missed a period and  has abdominal pain or bleeding. °· You may experience normal pregnancy symptoms, such as: °¨ Nausea. °¨ Tiredness. °¨ Breast tenderness. °· Other symptoms may include: °¨ Pain with intercourse. °¨ Irregular vaginal bleeding or spotting. °¨ Cramping or pain on one side or in the lower abdomen. °¨ Fast heartbeat. °¨ Passing out while having a bowel movement. °· Symptoms of a ruptured ectopic pregnancy and internal bleeding may include: °¨ Sudden, severe pain in the abdomen and pelvis. °¨ Dizziness or fainting. °¨ Pain in the shoulder area. °DIAGNOSIS  °Tests that may be performed include: °· A pregnancy test. °· An ultrasound test. °· Testing the specific level of pregnancy hormone in the bloodstream. °· Taking a sample of uterus tissue (dilation and curettage, D&C). °· Surgery to perform a visual exam of the inside of the abdomen using a thin, lighted tube with a tiny camera on the end (laparoscope). °TREATMENT  °An injection of a medicine called methotrexate may be given. This medicine causes the pregnancy tissue to be absorbed. It is given if: °· The diagnosis is made early. °· The fallopian tube has not ruptured. °· You are considered to be a good candidate for the medicine. °Usually, pregnancy hormone blood levels are checked after methotrexate treatment. This is to be sure the medicine is effective. It may take 4-6 weeks for the pregnancy to be absorbed (though most pregnancies will be absorbed by 3 weeks). °Surgical treatment may be needed. A laparoscope may be used to remove the pregnancy tissue. If severe internal bleeding occurs, a cut (incision) may be made in the lower abdomen (laparotomy), and the ectopic   pregnancy is removed. This stops the bleeding. Part of the fallopian tube, or the whole tube, may be removed as well (salpingectomy). After surgery, pregnancy hormone tests may be done to be sure there is no pregnancy tissue left. You may receive a Rho (D) immune globulin shot if you are Rh negative and  the father is Rh positive, or if you do not know the Rh type of the father. This is to prevent problems with any future pregnancy. °SEEK IMMEDIATE MEDICAL CARE IF:  °You have any symptoms of an ectopic pregnancy. This is a medical emergency. °MAKE SURE YOU: °· Understand these instructions. °· Will watch your condition. °· Will get help right away if you are not doing well or get worse. °  °This information is not intended to replace advice given to you by your health care provider. Make sure you discuss any questions you have with your health care provider. °  °Document Released: 04/13/2004 Document Revised: 03/27/2014 Document Reviewed: 10/03/2012 °Elsevier Interactive Patient Education ©2016 Elsevier Inc. °Vaginal Bleeding During Pregnancy, First Trimester °A small amount of bleeding (spotting) from the vagina is relatively common in early pregnancy. It usually stops on its own. Various things may cause bleeding or spotting in early pregnancy. Some bleeding may be related to the pregnancy, and some may not. In most cases, the bleeding is normal and is not a problem. However, bleeding can also be a sign of something serious. Be sure to tell your health care provider about any vaginal bleeding right away. °Some possible causes of vaginal bleeding during the first trimester include: °· Infection or inflammation of the cervix. °· Growths (polyps) on the cervix. °· Miscarriage or threatened miscarriage. °· Pregnancy tissue has developed outside of the uterus and in a fallopian tube (tubal pregnancy). °· Tiny cysts have developed in the uterus instead of pregnancy tissue (molar pregnancy). °HOME CARE INSTRUCTIONS  °Watch your condition for any changes. The following actions may help to lessen any discomfort you are feeling: °· Follow your health care provider's instructions for limiting your activity. If your health care provider orders bed rest, you may need to stay in bed and only get up to use the bathroom. However,  your health care provider may allow you to continue light activity. °· If needed, make plans for someone to help with your regular activities and responsibilities while you are on bed rest. °· Keep track of the number of pads you use each day, how often you change pads, and how soaked (saturated) they are. Write this down. °· Do not use tampons. Do not douche. °· Do not have sexual intercourse or orgasms until approved by your health care provider. °· If you pass any tissue from your vagina, save the tissue so you can show it to your health care provider. °· Only take over-the-counter or prescription medicines as directed by your health care provider. °· Do not take aspirin because it can make you bleed. °· Keep all follow-up appointments as directed by your health care provider. °SEEK MEDICAL CARE IF: °· You have any vaginal bleeding during any part of your pregnancy. °· You have cramps or labor pains. °· You have a fever, not controlled by medicine. °SEEK IMMEDIATE MEDICAL CARE IF:  °· You have severe cramps in your back or belly (abdomen). °· You pass large clots or tissue from your vagina. °· Your bleeding increases. °· You feel light-headed or weak, or you have fainting episodes. °· You have chills. °· You are leaking   fluid or have a gush of fluid from your vagina. °· You pass out while having a bowel movement. °MAKE SURE YOU: °· Understand these instructions. °· Will watch your condition. °· Will get help right away if you are not doing well or get worse. °  °This information is not intended to replace advice given to you by your health care provider. Make sure you discuss any questions you have with your health care provider. °  °Document Released: 12/14/2004 Document Revised: 03/11/2013 Document Reviewed: 11/11/2012 °Elsevier Interactive Patient Education ©2016 Elsevier Inc. ° °

## 2015-03-29 NOTE — MAU Provider Note (Signed)
Chief Complaint  Patient presents with  . Follow-up    SUBJECTIVE:  Paula Stark is a 25 y.o. G3P2002 at 699w1d menstrual dating presenting for follow-up quantitative beta hCG She denies any abdominal pain or vaginal bleeding. She did have bleeding after leaving here 2 days ago, but none since yesterday morning. Pregnancy is desired. She was first seen here on 03/24/2014 for vaginal bleeding. Quantitative beta hCG on that day was 9203. Value increased to 13,952 on 03/26/2014.  Ultrasound showed probable IUGS c/w 9441w1d size, no YS or embryo, normal adnexae and moderate SCH.  GC/CT and wet prep were all negative. Blood type O pos   Past Medical History  Diagnosis Date  . Eczema   . No pertinent past medical history   . Medical history non-contributory     OB History  Gravida Para Term Preterm AB SAB TAB Ectopic Multiple Living  3 2 2  0 0 0 0 0 0 2    # Outcome Date GA Lbr Len/2nd Weight Sex Delivery Anes PTL Lv  3 Current           2 Term 05/22/13 4613w1d 05:31 / 03:47 8 lb (3.63 kg) Genella MechM Vag-Spont EPI  Y  1 Term 07/12/11 2033w0d 16:46 / 00:55 7 lb 8.8 oz (3.425 kg) M Vag-Spont None  Y     Comments: none        OBJECTIVE:  Physical Exam  Filed Vitals:   03/29/15 1455  BP: 104/71  Pulse: 79  Temp: 97.3 F (36.3 C)  Resp: 18   Constitutional: She appears well-developed and well-nourished. No distress.  Abd: soft, NT  Results for orders placed or performed during the hospital encounter of 03/29/15 (from the past 24 hour(s))  hCG, quantitative, pregnancy     Status: Abnormal   Collection Time: 03/29/15  3:23 PM  Result Value Ref Range   hCG, Beta Chain, Quant, S 22194 (H) <5 mIU/mL   Discussed somewhat lagging increase in quantitative beta hCG values with Dr. Adrian BlackwaterStinson> will F/U US in 7-10 days  ASSESSMENT:   25 y.o. G3P2002 at 729w1d wks  Bleeding in early pregnancy - Plan: Discharge patient  Pregnancy of unknown anatomic location - Plan: Discharge patient    PLAN: Discharged  with bleeding and ectopic precautions    Medication List    TAKE these medications        PRENATAL VITAMINS PLUS 27-1 MG Tabs  Take 1 tablet by mouth daily. Generic is OK - Prenatal vitamin with 1 mg Folic acid      US to call with F/U appointment Follow-up Information    [facility].   Specialty:  Obstetrics and Gynecology   Why:  Someone from Clinic will call you with appt.   Contact information:   [address] [place] [id number] [phone]

## 2015-04-08 ENCOUNTER — Ambulatory Visit (INDEPENDENT_AMBULATORY_CARE_PROVIDER_SITE_OTHER): Payer: Medicaid Other | Admitting: Obstetrics & Gynecology

## 2015-04-08 ENCOUNTER — Encounter: Payer: Self-pay | Admitting: Obstetrics & Gynecology

## 2015-04-08 VITALS — BP 111/77 | HR 77 | Temp 98.1°F | Ht 60.0 in | Wt 159.2 lb

## 2015-04-08 DIAGNOSIS — O209 Hemorrhage in early pregnancy, unspecified: Secondary | ICD-10-CM

## 2015-04-08 NOTE — Progress Notes (Signed)
Here for follow up after bleeding in early pregnancy.  States since MAU 1/9 has has spotting when she urinated dark blood. Denies pain.

## 2015-04-08 NOTE — Progress Notes (Signed)
Ms. Tykera Skates  is a 25 y.o. G3P2002 at [redacted]w[redacted]d who presents to the [facility] today for follow-up quant hCG after MAU visit on 03/29/15 for bleeding in early pregnancy; previous scan on 03/25/15 showed IUGS, no YS and no fetal pole. She denies pain, has some spotting.  No fever today.   OB History  Gravida Para Term Preterm AB SAB TAB Ectopic Multiple Living  0 0 0 0 0 0 2    # Outcome Date GA Lbr Len/2nd Weight Sex Delivery Anes PTL Lv  3 Current           2 Term 05/22/13 [redacted]w[redacted]d 05:31 / 03:47 8 lb (3.63 kg) Genella Mech EPI  Y  1 Term 07/12/11 [redacted]w[redacted]d 16:46 / 00:55 7 lb 8.8 oz (3.425 kg) M Vag-Spont None  Y     Comments: none      Past Medical History  Diagnosis Date  . Eczema   . No pertinent past medical history   . Medical history non-contributory      BP 111/77 mmHg  Pulse 77  Temp(Src) 98.1 F (36.7 C)  Ht 5' (1.524 m)  Wt 159 lb 3.2 oz (72.213 kg)  BMI 31.09 kg/m2  LMP 02/14/2015  CONSTITUTIONAL: Well-developed, well-nourished female in no acute distress.  ENT: External right and left ear normal.  EYES: EOM intact, conjunctivae normal.  MUSCULOSKELETAL: Normal range of motion.  CARDIOVASCULAR: Regular heart rate RESPIRATORY: Normal effort NEUROLOGICAL: Alert and oriented to person, place, and time.  SKIN: Skin is warm and dry. No rash noted. Not diaphoretic. No erythema. No pallor. PSYCH: Normal mood and affect. Normal behavior. Normal judgment and thought content.  US Ob Comp Less 14 Wks  03/25/2015  CLINICAL DATA:  Bleeding. EXAM: OBSTETRIC <14 WK [place] AND TRANSVAGINAL OB US TECHNIQUE: Both transabdominal and transvaginal ultrasound examinations were performed for complete evaluation of the gestation as well as the maternal uterus, adnexal regions, and pelvic cul-de-sac. Transvaginal technique was performed to assess early pregnancy. COMPARISON:  None FINDINGS: Intrauterine gestational sac: Single Yolk sac:  No Embryo:  No Cardiac Activity: N/A Heart Rate: N/A   bpm MSD: 3.7  mm   5 w   1  d Subchorionic hemorrhage:  Moderate Maternal uterus/adnexae: Subchorionic hemorrhage: Moderate Right ovary: Normal Left ovary: Not visualize Other :None Free fluid:  None IMPRESSION: 1. Probable early intrauterine gestational sac, but no yolk sac, fetal pole, or cardiac activity yet visualized. Recommend follow-up quantitative B-HCG levels and follow-up US in 14 days to confirm and assess viability. This recommendation follows SRU consensus guidelines: Diagnostic Criteria for Nonviable Pregnancy Early in the First Trimester. Malva Limes Med 2013; 010:2725-36. 2. Moderate subchorionic hemorrhage. Electronically Signed   By: Signa Kell M.D.   On: 03/25/2015 12:59   US Ob Transvaginal  03/25/2015  CLINICAL DATA:  Bleeding. EXAM: OBSTETRIC <14 WK [place] AND TRANSVAGINAL OB US TECHNIQUE: Both transabdominal and transvaginal ultrasound examinations were performed for complete evaluation of the gestation as well as the maternal uterus, adnexal regions, and pelvic cul-de-sac. Transvaginal technique was performed to assess early pregnancy. COMPARISON:  None FINDINGS: Intrauterine gestational sac: Single Yolk sac:  No Embryo:  No Cardiac Activity: N/A Heart Rate: N/A  bpm MSD: 3.7  mm   5 w   1  d Subchorionic hemorrhage:  Moderate Maternal uterus/adnexae: Subchorionic hemorrhage: Moderate Right ovary: Normal Left ovary: Not visualize Other :None Free fluid:  None IMPRESSION: 1. Probable early intrauterine gestational sac, but  no yolk sac, fetal pole, or cardiac activity yet visualized. Recommend follow-up quantitative B-HCG levels and follow-up US in 14 days to confirm and assess viability. This recommendation follows SRU consensus guidelines: Diagnostic Criteria for Nonviable Pregnancy Early in the First Trimester. Malva Limes Med 2013; 161:0960-45. 2. Moderate subchorionic hemorrhage. Electronically Signed   By: Signa Kell M.D.   On: 03/25/2015 12:59    Results for [place], [place] (MRN [id number])  as of 04/08/2015 10:58  Ref. Range 03/25/2015 11:30 03/27/2015 12:19 03/29/2015 15:23  HCG, Beta Chain, Quant, S Latest Ref Range: <5 mIU/mL 9203 (H) 13952 (H) 22194 (H)    A: Bleeding in early pregnancy  P: Repeat HCG to be checked today First trimester/ectopic precautions discussed Patient will return for follow-up US soon. Order placed and RN to schedule. Patient will return to [facility] for results following [place].  Patient may return to MAU as needed or if her condition were to change or worsen   Tereso Newcomer, MD 04/08/2015 10:57 AM

## 2015-04-09 ENCOUNTER — Ambulatory Visit (HOSPITAL_COMMUNITY)
Admission: RE | Admit: 2015-04-09 | Discharge: 2015-04-09 | Disposition: A | Discharge status: Home / Self Care | Payer: Medicaid Other | Source: Ambulatory Visit | Attending: Obstetrics & Gynecology | Admitting: Obstetrics & Gynecology

## 2015-04-09 ENCOUNTER — Inpatient Hospital Stay (HOSPITAL_COMMUNITY)
Admission: AD | Admit: 2015-04-09 | Discharge: 2015-04-09 | Disposition: A | Discharge status: Home / Self Care | Payer: Medicaid Other | Source: Ambulatory Visit | Attending: Obstetrics and Gynecology | Admitting: Obstetrics and Gynecology

## 2015-04-09 DIAGNOSIS — Z3491 Encounter for supervision of normal pregnancy, unspecified, first trimester: Secondary | ICD-10-CM | POA: Diagnosis present

## 2015-04-09 DIAGNOSIS — O021 Missed abortion: Secondary | ICD-10-CM

## 2015-04-09 DIAGNOSIS — O284 Abnormal radiological finding on antenatal screening of mother: Secondary | ICD-10-CM | POA: Insufficient documentation

## 2015-04-09 DIAGNOSIS — Z3A00 Weeks of gestation of pregnancy not specified: Secondary | ICD-10-CM | POA: Insufficient documentation

## 2015-04-09 DIAGNOSIS — O034 Incomplete spontaneous abortion without complication: Secondary | ICD-10-CM | POA: Diagnosis not present

## 2015-04-09 DIAGNOSIS — O039 Complete or unspecified spontaneous abortion without complication: Secondary | ICD-10-CM

## 2015-04-09 DIAGNOSIS — Z3A01 Less than 8 weeks gestation of pregnancy: Secondary | ICD-10-CM | POA: Diagnosis not present

## 2015-04-09 DIAGNOSIS — O209 Hemorrhage in early pregnancy, unspecified: Secondary | ICD-10-CM

## 2015-04-09 DIAGNOSIS — O208 Other hemorrhage in early pregnancy: Secondary | ICD-10-CM | POA: Diagnosis not present

## 2015-04-09 LAB — HCG, QUANTITATIVE, PREGNANCY: hCG, Beta Chain, Quant, S: 46321 m[IU]/mL — ABNORMAL HIGH

## 2015-04-09 MED ORDER — PROMETHAZINE HCL 25 MG PO TABS: 25.0000 mg | ORAL_TABLET | Freq: Four times a day (QID) | ORAL | Status: DC | PRN

## 2015-04-09 MED ORDER — MISOPROSTOL 200 MCG PO TABS: 800.0000 ug | ORAL_TABLET | Freq: Once | ORAL | Status: DC

## 2015-04-09 MED ORDER — OXYCODONE-ACETAMINOPHEN 5-325 MG PO TABS: 1.0000 | ORAL_TABLET | Freq: Four times a day (QID) | ORAL | Status: DC | PRN

## 2015-04-09 NOTE — MAU Provider Note (Signed)
Chief Complaint: No chief complaint on file.   First Provider Initiated Contact with Patient 04/09/15 1514     SUBJECTIVE HPI  Paula Stark is a 25 y.o. G3P2002 at [redacted]w[redacted]d by LMP who presents to maternity admissions for followup ultrasound.  Upon review of the records patient was first seen on 03/25/15 for bleeding.   BHCG on that day was 9203.  Ultrasound showed Single gestational sac, empty with moderate SCH.  GC/CT and wet prep were collected.  Results were Neg/Neg.   Subsequent HCG levels were:   Ref. Range 03/27/2015 12:19  HCG, Beta Chain, Quant, S Latest Ref Range: <5 mIU/mL 13952 (H)    Ref. Range 03/29/2015 15:23  HCG, Beta Chain, Quant, S Latest Ref Range: <5 mIU/mL 22194 (H)    Ref. Range 04/08/2015 11:12  HCG, Beta Chain, Quant, S Latest Units: mIU/mL 46321.0 (H)    Pt here today with no report of abdominal pain but has vaginal spotting.      Past Medical History  Diagnosis Date  . Eczema   . No pertinent past medical history   . Medical history non-contributory    Past Surgical History  Procedure Laterality Date  . No past surgeries     Social History   Social History  . Marital Status: Married    Spouse Name: N/A  . Number of Children: N/A  . Years of Education: N/A   Occupational History  . Not on file.   Social History Main Topics  . Smoking status: Never Smoker   . Smokeless tobacco: Never Used  . Alcohol Use: No  . Drug Use: No  . Sexual Activity: Yes   Other Topics Concern  . Not on file   Social History Narrative   No current facility-administered medications on file prior to encounter.   Current Outpatient Prescriptions on File Prior to Encounter  Medication Sig Dispense Refill  . Prenatal Vit-Fe Fumarate-FA (PRENATAL VITAMINS PLUS) 27-1 MG TABS Take 1 tablet by mouth daily. Generic is OK - Prenatal vitamin with 1 mg Folic acid (Patient not taking: Reported on 04/08/2015) 30 tablet 0   No Known Allergies  I have reviewed patient's Past Medical  Hx, Surgical Hx, Family Hx, Social Hx, medications and allergies.   ROS:  Review of Systems Review of Systems  Constitutional: Negative for fever and chills.  Gastrointestinal: Negative for nausea, vomiting, abdominal pain, diarrhea and constipation.  Genitourinary: Negative for dysuria. + for spotting, no cramping Musculoskeletal: Negative for back pain.  Neurological: Negative for dizziness and weakness.    Physical Exam   Physical Exam  Constitutional: Well-developed, well-nourished female in no acute distress.  Cardiovascular: normal rate Respiratory: normal effort MS: Extremities  normal ROM Neurologic: Alert and oriented x 4.   PELVIC EXAM: deferred  LAB RESULTS No results found for this or any previous visit (from the past 24 hour(s)).     IMAGING US Ob Comp Less 14 Wks  03/25/2015  CLINICAL DATA:  Bleeding. EXAM: OBSTETRIC <14 WK [place] AND TRANSVAGINAL OB US TECHNIQUE: Both transabdominal and transvaginal ultrasound examinations were performed for complete evaluation of the gestation as well as the maternal uterus, adnexal regions, and pelvic cul-de-sac. Transvaginal technique was performed to assess early pregnancy. COMPARISON:  None FINDINGS: Intrauterine gestational sac: Single Yolk sac:  No Embryo:  No Cardiac Activity: N/A Heart Rate: N/A  bpm MSD: 3.7  mm   5 w   1  d Subchorionic hemorrhage:  Moderate Maternal uterus/adnexae: Subchorionic hemorrhage: Moderate Right  ovary: Normal Left ovary: Not visualize Other :None Free fluid:  None IMPRESSION: 1. Probable early intrauterine gestational sac, but no yolk sac, fetal pole, or cardiac activity yet visualized. Recommend follow-up quantitative B-HCG levels and follow-up US in 14 days to confirm and assess viability. This recommendation follows SRU consensus guidelines: Diagnostic Criteria for Nonviable Pregnancy Early in the First Trimester. Malva Limes Med 2013; 161:0960-45. 2. Moderate subchorionic hemorrhage. Electronically Signed    By: Signa Kell M.D.   On: 03/25/2015 12:59   US Ob Transvaginal  04/09/2015  CLINICAL DATA:  First trimester pregnancy with vaginal spotting. Inconclusive viability on prior study. LMP 02/14/2015. Beta HCG level 46,321 yesterday and 22,194 11 days ago. EXAM: TRANSVAGINAL OB ULTRASOUND TECHNIQUE: Transvaginal ultrasound was performed for complete evaluation of the gestation as well as the maternal uterus, adnexal regions, and pelvic cul-de-sac. COMPARISON:  Ultrasound 03/25/2015. FINDINGS: Intrauterine gestational sac: None visualized. Subchorionic hemorrhage: There is a large complex fluid collection within the endometrial canal, some of which may reflect subchorionic hemorrhage. Maternal uterus/adnexae: The right ovary is visualized and appears normal. The left ovary is not seen. No evidence of adnexal mass. No significant free pelvic fluid. IMPRESSION: Enlarging complex fluid within the endometrial canal without demonstrated intrauterine pregnancy. Findings are most consistent with a completed spontaneous abortion. However, given the rising beta HCG levels over the last 12 days, occult ectopic pregnancy cannot be excluded. Continued follow-up of the beta HCG levels (and follow-up ultrasound if necessary) recommended. Electronically Signed   By: Carey Bullocks M.D.   On: 04/09/2015 14:27   US Ob Transvaginal  03/25/2015  CLINICAL DATA:  Bleeding. EXAM: OBSTETRIC <14 WK [place] AND TRANSVAGINAL OB US TECHNIQUE: Both transabdominal and transvaginal ultrasound examinations were performed for complete evaluation of the gestation as well as the maternal uterus, adnexal regions, and pelvic cul-de-sac. Transvaginal technique was performed to assess early pregnancy. COMPARISON:  None FINDINGS: Intrauterine gestational sac: Single Yolk sac:  No Embryo:  No Cardiac Activity: N/A Heart Rate: N/A  bpm MSD: 3.7  mm   5 w   1  d Subchorionic hemorrhage:  Moderate Maternal uterus/adnexae: Subchorionic hemorrhage: Moderate  Right ovary: Normal Left ovary: Not visualize Other :None Free fluid:  None IMPRESSION: 1. Probable early intrauterine gestational sac, but no yolk sac, fetal pole, or cardiac activity yet visualized. Recommend follow-up quantitative B-HCG levels and follow-up US in 14 days to confirm and assess viability. This recommendation follows SRU consensus guidelines: Diagnostic Criteria for Nonviable Pregnancy Early in the First Trimester. Malva Limes Med 2013; 409:8119-14. 2. Moderate subchorionic hemorrhage. Electronically Signed   By: Signa Kell M.D.   On: 03/25/2015 12:59    MAU Management/MDM: US done and I reviewed results.   Consult Dr Jolayne Panther who recommends either expectant management or Cytotec with HCG followup weekly. Pt stable at time of discharge.    ASSESSMENT Pregnancy at [redacted]w[redacted]d by LMP Previously seen gestational sac is no longer seen.  Now uterus filled with blood/clot Missed abortion/incomplete abortion  PLAN Discharge home Discussed options, patient wishes to use Cytotec Rx given for Cytotec with instructions to place 4 tabs in vagina Rx Percocet for pain Rx Phenergan for nausea Appt made for Friday for 8-11am in clinic for Quant HCG. Bleeding precautions    Medication List    ASK your doctor about these medications        PRENATAL VITAMINS PLUS 27-1 MG Tabs  Take 1 tablet by mouth daily. Generic is OK - Prenatal vitamin  with 1 mg Folic acid         Wynelle Bourgeois CNM, MSN Certified Nurse-Midwife 04/09/2015  3:21 PM

## 2015-04-09 NOTE — Discharge Instructions (Signed)
Incomplete Miscarriage °A miscarriage is the sudden loss of an unborn baby (fetus) before the 20th week of pregnancy. In an incomplete miscarriage, parts of the fetus or placenta (afterbirth) remain in the body.  °Having a miscarriage can be an emotional experience. Talk with your health care provider about any questions you may have about miscarrying, the grieving process, and your future pregnancy plans. °CAUSES  °· Problems with the fetal chromosomes that make it impossible for the baby to develop normally. Problems with the baby's genes or chromosomes are most often the result of errors that occur by chance as the embryo divides and grows. The problems are not inherited from the parents. °· Infection of the cervix or uterus. °· Hormone problems. °· Problems with the cervix, such as having an incompetent cervix. This is when the tissue in the cervix is not strong enough to hold the pregnancy. °· Problems with the uterus, such as an abnormally shaped uterus, uterine fibroids, or congenital abnormalities. °· Certain medical conditions. °· Smoking, drinking alcohol, or taking illegal drugs. °· Trauma. °SYMPTOMS  °· Vaginal bleeding or spotting, with or without cramps or pain. °· Pain or cramping in the abdomen or lower back. °· Passing fluid, tissue, or blood clots from the vagina. °DIAGNOSIS  °Your health care provider will perform a physical exam. You may also have an ultrasound to confirm the miscarriage. Blood or urine tests may also be ordered. °TREATMENT  °· Usually, a dilation and curettage (D&C) procedure is performed. During a D&C procedure, the cervix is widened (dilated) and any remaining fetal or placental tissue is gently removed from the uterus. °· Antibiotic medicines are prescribed if there is an infection. Other medicines may be given to reduce the size of the uterus (contract) if there is a lot of bleeding. °· If you have Rh negative blood and your baby was Rh positive, you will need a Rho (D)  immune globulin shot. This shot will protect any future baby from having Rh blood problems in future pregnancies. °· You may be confined to bed rest. This means you should stay in bed and only get up to use the bathroom. °HOME CARE INSTRUCTIONS  °· Rest as directed by your health care provider. °· Restrict activity as directed by your health care provider. You may be allowed to continue light activity if curettage was not done but you require further treatment. °· Keep track of the number of pads you use each day. Keep track of how soaked (saturated) they are. Record this information. °· Do not  use tampons. °· Do not douche or have sexual intercourse until approved by your health care provider. °· Keep all follow-up appointments for reevaluation and continuing management. °· Only take over-the-counter or prescription medicines for pain, fever, or discomfort as directed by your health care provider. °· Take antibiotic medicine as directed by your health care provider. Make sure you finish it even if you start to feel better. °SEEK IMMEDIATE MEDICAL CARE IF:  °· You experience severe cramps in your stomach, back, or abdomen. °· You have an unexplained temperature (make sure to record these temperatures). °· You pass large clots or tissue (save these for your health care provider to inspect). °· Your bleeding increases. °· You become light-headed, weak, or have fainting episodes. °MAKE SURE YOU:  °· Understand these instructions. °· Will watch your condition. °· Will get help right away if you are not doing well or get worse. °  °This information is not intended to   replace advice given to you by your health care provider. Make sure you discuss any questions you have with your health care provider.   Document Released: 03/06/2005 Document Revised: 03/27/2014 Document Reviewed: 10/03/2012 Elsevier Interactive Patient Education 2016 [employer]  FACTS YOU SHOULD KNOW  WHAT IS AN EARLY PREGNANCY FAILURE? Once  the egg is fertilized with the sperm and begins to develop, it attaches to the lining of the uterus. This early pregnancy tissue may not develop into an embryo (the beginning stage of a baby). Sometimes an embryo does develop but does not continue to grow. These problems can be seen on ultrasound.   MANAGEMNT OF EARLY PREGNANCY FAILURE: About 4 out of 100 (0.25%) women will have a pregnancy loss in her lifetime.  One in five pregnancies is found to be an early pregnancy failure.  There are 3 ways to care for an early pregnancy failure:   (1) Surgery, (2) Medicine, (3) Waiting for you to pass the pregnancy on your own. The decision as to how to proceed after being diagnosed with and early pregnancy failure is an individual one.  The decision can be made only after appropriate counseling.  You need to weigh the pros and cons of the 3 choices. Then you can make the choice that works for you. SURGERY (D&E)  Procedure over in 1 day  Requires being put to sleep  Bleeding may be light  Possible problems during surgery, including injury to womb(uterus)  Care provider has more control Medicine (CYTOTEC)  The complete procedure may take days to weeks  No Surgery  Bleeding may be heavy at times  There may be drug side effects  Patient has more control Waiting  You may choose to wait, in which case your own body may complete the passing of the abnormal early pregnancy on its own in about 2-4 weeks  Your bleeding may be heavy at times  There is a small possibility that you may need surgery if the bleeding is too much or not all of the pregnancy has passed. CYTOTEC MANAGEMENT Prostaglandins (cytotec) are the most widely used drug for this purpose. They cause the uterus to cramp and contract. You will place the medicine yourself inside your vagina in the privacy of your home. Empting of the uterus should occur within 3 days but the process may continue for several weeks. The bleeding may seem  heavy at times. POSSIBLE SIDE EFFECTS FROM CYTOTEC  Nausea   Vomiting  Diarrhea Fever  Chills  Hot Flashes Side effects  from the process of the early pregnancy failure include:  Cramping  Bleeding  Headaches  Dizziness RISKS: This is a low risk procedure. Less than 1 in 100 women has a complication. An incomplete passage of the early pregnancy may occur. Also, Hemorrhage (heavy bleeding) could happen.  Rarely the pregnancy will not be passed completely. Excessively heavy bleeding may occur.  Your doctor may need to perform surgery to empty the uterus (D&E). Afterwards: Everybody will feel differently after the early pregnancy completion. You may have soreness or cramps for a day or two. You may have soreness or cramps for day or two.  You may have light bleeding for up to 2 weeks. You may be as active as you feel like being. If you have any of the following problems you may call [facility] at [phone].  If you have pain that does not get better  with pain medication  Bleeding that soaks through 2 thick full-sized  sanitary pads in an hour °• Cramps that last longer than 2 days °• Foul smelling discharge °• Fever above 100.4 degrees F °Even if you do not have any of these symptoms, you should have a follow-up exam to make sure you are healing properly. This appointment will be made for you before you leave the hospital. Your next normal period will start again in 4-6 week after the loss. You can get pregnant soon after the loss, so use birth control right away. °Finally: °Make sure all your questions are answered before during and after any procedure. Follow up with medical care and family planning methods. ° °  ° ° °

## 2015-04-16 ENCOUNTER — Encounter (HOSPITAL_COMMUNITY): Payer: Self-pay | Admitting: *Deleted

## 2015-04-16 ENCOUNTER — Encounter: Payer: Self-pay | Admitting: Obstetrics and Gynecology

## 2015-04-16 ENCOUNTER — Ambulatory Visit (INDEPENDENT_AMBULATORY_CARE_PROVIDER_SITE_OTHER): Payer: Medicaid Other | Admitting: Obstetrics and Gynecology

## 2015-04-16 DIAGNOSIS — O021 Missed abortion: Secondary | ICD-10-CM | POA: Diagnosis present

## 2015-04-16 DIAGNOSIS — O3680X0 Pregnancy with inconclusive fetal viability, not applicable or unspecified: Secondary | ICD-10-CM

## 2015-04-16 LAB — HCG, QUANTITATIVE, PREGNANCY: hCG, Beta Chain, Quant, S: 41627 m[IU]/mL — ABNORMAL HIGH (ref <5)

## 2015-04-16 NOTE — Patient Instructions (Signed)
Incomplete Miscarriage A miscarriage is the sudden loss of an unborn baby (fetus) before the 20th week of pregnancy. In an incomplete miscarriage, parts of the fetus or placenta (afterbirth) remain in the body.  Having a miscarriage can be an emotional experience. Talk with your health care provider about any questions you may have about miscarrying, the grieving process, and your future pregnancy plans. CAUSES   Problems with the fetal chromosomes that make it impossible for the baby to develop normally. Problems with the baby's genes or chromosomes are most often the result of errors that occur by chance as the embryo divides and grows. The problems are not inherited from the parents.  Infection of the cervix or uterus.  Hormone problems.  Problems with the cervix, such as having an incompetent cervix. This is when the tissue in the cervix is not strong enough to hold the pregnancy.  Problems with the uterus, such as an abnormally shaped uterus, uterine fibroids, or congenital abnormalities.  Certain medical conditions.  Smoking, drinking alcohol, or taking illegal drugs.  Trauma. SYMPTOMS   Vaginal bleeding or spotting, with or without cramps or pain.  Pain or cramping in the abdomen or lower back.  Passing fluid, tissue, or blood clots from the vagina. DIAGNOSIS  Your health care provider will perform a physical exam. You may also have an ultrasound to confirm the miscarriage. Blood or urine tests may also be ordered. TREATMENT   Usually, a dilation and curettage (D&C) procedure is performed. During a D&C procedure, the cervix is widened (dilated) and any remaining fetal or placental tissue is gently removed from the uterus.  Antibiotic medicines are prescribed if there is an infection. Other medicines may be given to reduce the size of the uterus (contract) if there is a lot of bleeding.  If you have Rh negative blood and your baby was Rh positive, you will need a Rho (D)  immune globulin shot. This shot will protect any future baby from having Rh blood problems in future pregnancies.  You may be confined to bed rest. This means you should stay in bed and only get up to use the bathroom. HOME CARE INSTRUCTIONS   Rest as directed by your health care provider.  Restrict activity as directed by your health care provider. You may be allowed to continue light activity if curettage was not done but you require further treatment.  Keep track of the number of pads you use each day. Keep track of how soaked (saturated) they are. Record this information.  Do not  use tampons.  Do not douche or have sexual intercourse until approved by your health care provider.  Keep all follow-up appointments for reevaluation and continuing management.  Only take over-the-counter or prescription medicines for pain, fever, or discomfort as directed by your health care provider.  Take antibiotic medicine as directed by your health care provider. Make sure you finish it even if you start to feel better. SEEK IMMEDIATE MEDICAL CARE IF:   You experience severe cramps in your stomach, back, or abdomen.  You have an unexplained temperature (make sure to record these temperatures).  You pass large clots or tissue (save these for your health care provider to inspect).  Your bleeding increases.  You become light-headed, weak, or have fainting episodes. MAKE SURE YOU:   Understand these instructions.  Will watch your condition.  Will get help right away if you are not doing well or get worse.   This information is not intended to   replace advice given to you by your health care provider. Make sure you discuss any questions you have with your health care provider.   Document Released: 03/06/2005 Document Revised: 03/27/2014 Document Reviewed: 10/03/2012 Elsevier Interactive Patient Education 2016 [employer] Inc.  

## 2015-04-16 NOTE — Progress Notes (Signed)
Patient ID: Paula Stark, female   DOB: Jul 24, 1990, 25 y.o.   MRN: [id number]  [name]  is a 25 y.o. G3P2002 at [redacted]w[redacted]d who presents to the [facility] today for follow-up quant hCG after 1 week. The patient was diagnosed with a presumed SAB and given Cytotec. The patient has been seen in MAU originally on 03/25/15 and had hCG of 9203 and US showed IUGS only. The patient returned to MAU on 03/27/15 and hCG was 40981. Patient was then advised to have [place] in 7-10 day. The patient was seen in MAU on 04/09/15 for [place] results and US showed a small fluid and reported most likely SAB. She denies pain, vaginal bleeding or fever today. She was given Cytotec on 04/09/15. She states that she did use the Cytotec and bled for 1 day. She states that she continues to have nausea.   OB History  Gravida Para Term Preterm AB SAB TAB Ectopic Multiple Living  0 0 0 0 0 0 2    # Outcome Date GA Lbr Len/2nd Weight Sex Delivery Anes PTL Lv  3 Current           2 Term 05/22/13 [redacted]w[redacted]d 05:31 / 03:47 8 lb (3.63 kg) Genella Mech EPI  Y  1 Term 07/12/11 [redacted]w[redacted]d 16:46 / 00:55 7 lb 8.8 oz (3.425 kg) M Vag-Spont None  Y     Comments: none      Past Medical History  Diagnosis Date  . Eczema   . No pertinent past medical history   . Medical history non-contributory      LMP 02/14/2015  CONSTITUTIONAL: Well-developed, well-nourished female in no acute distress.  ENT: External right and left ear normal.  EYES: EOM intact, conjunctivae normal.  MUSCULOSKELETAL: Normal range of motion.  CARDIOVASCULAR: Regular heart rate RESPIRATORY: Normal effort NEUROLOGICAL: Alert and oriented to person, place, and time.  SKIN: Skin is warm and dry. No rash noted. Not diaphoretic. No erythema. No pallor. PSYCH: Normal mood and affect. Normal behavior. Normal judgment and thought content.  Results for orders placed or performed in visit on 04/16/15 (from the past 24 hour(s))  hCG, quantitative, pregnancy     Status: Abnormal   Collection Time: 04/16/15 10:20 AM  Result Value Ref Range   hCG, Beta Chain, Quant, S 41627 (H) <5 mIU/mL   MDM Discussed patient with Dr. Debroah Loop. He has reviewed the patients images and recent labs. He recommends scheduling patient for D&C. Patient scheduled for D&C with Dr. Nedra Hai on Tuesday, January 31st at 10:15 am. Message sent to [place] Presnell to inform patient of surgery time and routine pre-procedural instructions.    A: Insignificant decline in hCG after Cytotec Missed AB  P: Discharge home Bleeding precautions discussed Patient scheduled for D&C on Tuesday.  Patient may return to MAU as needed or if her condition were to change or worsen   Marny Lowenstein, PA-C 04/16/2015 1:08 PM

## 2015-04-19 ENCOUNTER — Ambulatory Visit: Admit: 2015-04-19 | Payer: Self-pay | Admitting: Family Medicine

## 2015-04-19 SURGERY — DILATION AND EVACUATION, UTERUS
Anesthesia: Choice

## 2015-04-20 ENCOUNTER — Ambulatory Visit (HOSPITAL_COMMUNITY): Payer: Medicaid Other | Admitting: Anesthesiology

## 2015-04-20 ENCOUNTER — Ambulatory Visit (HOSPITAL_COMMUNITY)
Admission: RE | Admit: 2015-04-20 | Discharge: 2015-04-20 | Disposition: A | Discharge status: Home / Self Care | Payer: Medicaid Other | Source: Ambulatory Visit | Attending: Obstetrics & Gynecology | Admitting: Obstetrics & Gynecology

## 2015-04-20 ENCOUNTER — Encounter (HOSPITAL_COMMUNITY): Payer: Self-pay | Admitting: Anesthesiology

## 2015-04-20 ENCOUNTER — Encounter (HOSPITAL_COMMUNITY)
Admission: RE | Disposition: A | Discharge status: Home / Self Care | Payer: Self-pay | Source: Ambulatory Visit | Attending: Obstetrics & Gynecology

## 2015-04-20 DIAGNOSIS — O021 Missed abortion: Secondary | ICD-10-CM | POA: Diagnosis present

## 2015-04-20 HISTORY — PX: DILATION AND EVACUATION: SHX1459

## 2015-04-20 SURGERY — DILATION AND EVACUATION, UTERUS
Anesthesia: General

## 2015-04-20 MED ORDER — OXYTOCIN 10 UNIT/ML IJ SOLN
40.0000 [IU] | INTRAVENOUS | Status: DC | PRN
  Administered 2015-04-20: 40 [IU] via INTRAVENOUS

## 2015-04-20 MED ORDER — MIDAZOLAM HCL 5 MG/5ML IJ SOLN
INTRAMUSCULAR | Status: DC | PRN
  Administered 2015-04-20: 2 mg via INTRAVENOUS

## 2015-04-20 MED ORDER — PROPOFOL 10 MG/ML IV BOLUS
INTRAVENOUS | Status: AC
  Filled 2015-04-20: qty 20

## 2015-04-20 MED ORDER — BUPIVACAINE-EPINEPHRINE (PF) 0.5% -1:200000 IJ SOLN
INTRAMUSCULAR | Status: AC
  Filled 2015-04-20: qty 30

## 2015-04-20 MED ORDER — OXYCODONE HCL 5 MG/5ML PO SOLN: 5.0000 mg | Freq: Once | ORAL | Status: DC | PRN

## 2015-04-20 MED ORDER — SCOPOLAMINE 1 MG/3DAYS TD PT72: 1.0000 | MEDICATED_PATCH | Freq: Once | TRANSDERMAL | Status: DC

## 2015-04-20 MED ORDER — DOXYCYCLINE HYCLATE 100 MG IV SOLR
200.0000 mg | INTRAVENOUS | Status: AC
  Administered 2015-04-20: 200 mg via INTRAVENOUS
  Filled 2015-04-20: qty 200

## 2015-04-20 MED ORDER — MEPERIDINE HCL 25 MG/ML IJ SOLN: 6.2500 mg | INTRAMUSCULAR | Status: DC | PRN

## 2015-04-20 MED ORDER — MIDAZOLAM HCL 2 MG/2ML IJ SOLN
INTRAMUSCULAR | Status: AC
  Filled 2015-04-20: qty 2

## 2015-04-20 MED ORDER — LACTATED RINGERS IV SOLN
INTRAVENOUS | Status: DC
  Administered 2015-04-20: 10:00:00 via INTRAVENOUS

## 2015-04-20 MED ORDER — FENTANYL CITRATE (PF) 100 MCG/2ML IJ SOLN
INTRAMUSCULAR | Status: AC
  Filled 2015-04-20: qty 2

## 2015-04-20 MED ORDER — DEXAMETHASONE SODIUM PHOSPHATE 4 MG/ML IJ SOLN
INTRAMUSCULAR | Status: DC | PRN
  Administered 2015-04-20: 10 mg via INTRAVENOUS

## 2015-04-20 MED ORDER — ONDANSETRON HCL 4 MG/2ML IJ SOLN
INTRAMUSCULAR | Status: DC | PRN
  Administered 2015-04-20: 4 mg via INTRAVENOUS

## 2015-04-20 MED ORDER — KETOROLAC TROMETHAMINE 30 MG/ML IJ SOLN
INTRAMUSCULAR | Status: AC
  Filled 2015-04-20: qty 1

## 2015-04-20 MED ORDER — IBUPROFEN 600 MG PO TABS: 600.0000 mg | ORAL_TABLET | Freq: Four times a day (QID) | ORAL | Status: DC | PRN

## 2015-04-20 MED ORDER — LACTATED RINGERS IV SOLN
INTRAVENOUS | Status: DC
  Administered 2015-04-20: 09:00:00 via INTRAVENOUS

## 2015-04-20 MED ORDER — ONDANSETRON HCL 4 MG/2ML IJ SOLN
INTRAMUSCULAR | Status: AC
  Filled 2015-04-20: qty 2

## 2015-04-20 MED ORDER — OXYCODONE HCL 5 MG PO TABS: 5.0000 mg | ORAL_TABLET | Freq: Once | ORAL | Status: DC | PRN

## 2015-04-20 MED ORDER — FENTANYL CITRATE (PF) 100 MCG/2ML IJ SOLN
INTRAMUSCULAR | Status: DC | PRN
  Administered 2015-04-20 (×2): 50 ug via INTRAVENOUS

## 2015-04-20 MED ORDER — DEXAMETHASONE SODIUM PHOSPHATE 10 MG/ML IJ SOLN
INTRAMUSCULAR | Status: AC
  Filled 2015-04-20: qty 1

## 2015-04-20 MED ORDER — LIDOCAINE HCL (CARDIAC) 20 MG/ML IV SOLN
INTRAVENOUS | Status: DC | PRN
  Administered 2015-04-20: 100 mg via INTRAVENOUS

## 2015-04-20 MED ORDER — LIDOCAINE HCL (CARDIAC) 20 MG/ML IV SOLN
INTRAVENOUS | Status: AC
  Filled 2015-04-20: qty 5

## 2015-04-20 MED ORDER — OXYTOCIN 10 UNIT/ML IJ SOLN
INTRAMUSCULAR | Status: AC
  Filled 2015-04-20: qty 4

## 2015-04-20 MED ORDER — OXYCODONE-ACETAMINOPHEN 5-325 MG PO TABS: 1.0000 | ORAL_TABLET | Freq: Four times a day (QID) | ORAL | Status: DC | PRN

## 2015-04-20 MED ORDER — KETOROLAC TROMETHAMINE 30 MG/ML IJ SOLN
INTRAMUSCULAR | Status: DC | PRN
  Administered 2015-04-20: 30 mg via INTRAVENOUS

## 2015-04-20 MED ORDER — PROPOFOL 10 MG/ML IV BOLUS
INTRAVENOUS | Status: DC | PRN
  Administered 2015-04-20: 200 mg via INTRAVENOUS

## 2015-04-20 MED ORDER — HYDROMORPHONE HCL 1 MG/ML IJ SOLN: 0.2500 mg | INTRAMUSCULAR | Status: DC | PRN

## 2015-04-20 SURGICAL SUPPLY — 18 items
CATH ROBINSON RED A/P 16FR (CATHETERS) ×2 IMPLANT
CLOTH BEACON ORANGE TIMEOUT ST (SAFETY) ×2 IMPLANT
DECANTER SPIKE VIAL GLASS SM (MISCELLANEOUS) IMPLANT
GLOVE BIO SURGEON STRL SZ 6.5 (GLOVE) ×2 IMPLANT
GLOVE BIOGEL PI IND STRL 7.0 (GLOVE) ×2 IMPLANT
GLOVE BIOGEL PI INDICATOR 7.0 (GLOVE) ×2
GOWN STRL REUS W/TWL LRG LVL3 (GOWN DISPOSABLE) ×6 IMPLANT
KIT BERKELEY 1ST TRIMESTER 3/8 (MISCELLANEOUS) ×2 IMPLANT
NS IRRIG 1000ML POUR BTL (IV SOLUTION) ×2 IMPLANT
PACK VAGINAL MINOR WOMEN LF (CUSTOM PROCEDURE TRAY) ×2 IMPLANT
PAD OB MATERNITY 4.3X12.25 (PERSONAL CARE ITEMS) ×2 IMPLANT
PAD PREP 24X48 CUFFED NSTRL (MISCELLANEOUS) ×2 IMPLANT
SET BERKELEY SUCTION TUBING (SUCTIONS) ×2 IMPLANT
TOWEL OR 17X24 6PK STRL BLUE (TOWEL DISPOSABLE) ×4 IMPLANT
VACURETTE 10 RIGID CVD (CANNULA) IMPLANT
VACURETTE 7MM CVD STRL WRAP (CANNULA) IMPLANT
VACURETTE 8 RIGID CVD (CANNULA) IMPLANT
VACURETTE 9 RIGID CVD (CANNULA) IMPLANT

## 2015-04-20 NOTE — Anesthesia Preprocedure Evaluation (Signed)
Anesthesia Evaluation  Patient identified by MRN, date of birth, ID band Patient awake    Reviewed: Allergy & Precautions, NPO status , Patient's Chart, lab work & pertinent test results  Airway Mallampati: I  TM Distance: >3 FB Neck ROM: Full    Dental  (+) Teeth Intact, Dental Advisory Given   Pulmonary  breath sounds clear to auscultation        Cardiovascular Rhythm:Regular Rate:Normal     Neuro/Psych    GI/Hepatic   Endo/Other    Renal/GU      Musculoskeletal   Abdominal   Peds  Hematology   Anesthesia Other Findings   Reproductive/Obstetrics                             Anesthesia Physical Anesthesia Plan  ASA: II  Anesthesia Plan: General   Post-op Pain Management:    Induction: Intravenous  Airway Management Planned: LMA  Additional Equipment:   Intra-op Plan:   Post-operative Plan: Extubation in OR  Informed Consent: I have reviewed the patients History and Physical, chart, labs and discussed the procedure including the risks, benefits and alternatives for the proposed anesthesia with the patient or authorized representative who has indicated his/her understanding and acceptance.   Dental advisory given  Plan Discussed with: CRNA, Anesthesiologist and Surgeon  Anesthesia Plan Comments:         Anesthesia Quick Evaluation  

## 2015-04-20 NOTE — Anesthesia Procedure Notes (Signed)
Procedure Name: LMA Insertion Date/Time: 04/20/2015 9:35 AM Performed by: Junious Silk Pre-anesthesia Checklist: Patient identified, Emergency Drugs available, Suction available, Patient being monitored and Timeout performed Patient Re-evaluated:Patient Re-evaluated prior to inductionOxygen Delivery Method: Circle system utilized Preoxygenation: Pre-oxygenation with 100% oxygen Intubation Type: IV induction LMA: LMA inserted LMA Size: 4.0 Number of attempts: 1 Placement Confirmation: positive ETCO2,  CO2 detector and breath sounds checked- equal and bilateral Dental Injury: Injury to lip

## 2015-04-20 NOTE — Discharge Instructions (Signed)
Dilation and Curettage or Vacuum Curettage, Care After Refer to this sheet in the next few weeks. These instructions provide you with information on caring for yourself after your procedure. Your health care provider may also give you more specific instructions. Your treatment has been planned according to current medical practices, but problems sometimes occur. Call your health care provider if you have any problems or questions after your procedure. WHAT TO EXPECT AFTER THE PROCEDURE After your procedure, it is typical to have light cramping and bleeding. This may last for 2 days to 2 weeks after the procedure. HOME CARE INSTRUCTIONS   Do not drive for 24 hours.  Wait 1 week before returning to strenuous activities.  Take your temperature 2 times a day for 4 days and write it down. Provide these temperatures to your health care provider if you develop a fever.  Avoid long periods of standing.  Avoid heavy lifting, pushing, or pulling. Do not lift anything heavier than 10 pounds (4.5 kg).  Limit stair climbing to once or twice a day.  Take rest periods often.  You may resume your usual diet.  Drink enough fluids to keep your urine clear or pale yellow.  Your usual bowel function should return. If you have constipation, you may:  Take a mild laxative with permission from your health care provider.  Add fruit and bran to your diet.  Drink more fluids.  Take showers instead of baths until your health care provider gives you permission to take baths.  Do not go swimming or use a hot tub until your health care provider approves.  Try to have someone with you or available to you the first 24-48 hours, especially if you were given a general anesthetic.  Do not douche, use tampons, or have sex (intercourse) for 2 weeks after the procedure.  Only take over-the-counter or prescription medicines as directed by your health care provider. Do not take aspirin. It can cause  bleeding.  Follow up with your health care provider as directed. SEEK MEDICAL CARE IF:   You have increasing cramps or pain that is not relieved with medicine.  You have abdominal pain that does not seem to be related to the same area of earlier cramping and pain.  You have bad smelling vaginal discharge.  You have a rash.  You are having problems with any medicine. SEEK IMMEDIATE MEDICAL CARE IF:   You have bleeding that is heavier than a normal menstrual period.  You have a fever.  You have chest pain.  You have shortness of breath.  You feel dizzy or feel like fainting.  You pass out.  You have pain in your shoulder strap area.  You have heavy vaginal bleeding with or without blood clots. MAKE SURE YOU:   Understand these instructions.  Will watch your condition.  Will get help right away if you are not doing well or get worse.   This information is not intended to replace advice given to you by your health care provider. Make sure you discuss any questions you have with your health care provider.   Document Released: 03/03/2000 Document Revised: 03/11/2013 Document Reviewed: 10/03/2012 Elsevier Interactive Patient Education 2016 [employer]vier Inc.  

## 2015-04-20 NOTE — Transfer of Care (Signed)
Immediate Anesthesia Transfer of Care Note  Patient: Paula Stark  Procedure(s) Performed: Procedure(s): DILATATION AND EVACUATION (N/A)  Patient Location: PACU  Anesthesia Type:General  Level of Consciousness: sedated  Airway & Oxygen Therapy: Patient Spontanous Breathing and Patient connected to nasal cannula oxygen  Post-op Assessment: Report given to RN and Post -op Vital signs reviewed and stable  Post vital signs: Reviewed and stable  Last Vitals:  Filed Vitals:   04/20/15 0850  BP: 115/83  Pulse: 82  Temp: 36.8 C  Resp: 20    Complications: No apparent anesthesia complications

## 2015-04-20 NOTE — H&P (Addendum)
Patient ID: Paula Stark, female DOB: 07/04/1990, 25 y.o. MRN: [id number]  [name] is a 25 y.o. G3P2002 at [redacted]w[redacted]d who presents to the [facility] today for follow-up quant hCG after 1 week. The patient was diagnosed with a presumed SAB and given Cytotec. The patient has been seen in MAU originally on 03/25/15 and had hCG of 9203 and US showed IUGS only. The patient returned to MAU on 03/27/15 and hCG was 40981. Patient was then advised to have [place] in 7-10 day. The patient was seen in MAU on 04/09/15 for [place] results and US showed a small fluid and reported most likely SAB. She denies pain, vaginal bleeding or fever today. She was given Cytotec on 04/09/15. She states that she did use the Cytotec and bled for 1 day. She states that she continues to have nausea.   OB History  Gravida Para Term Preterm AB SAB TAB Ectopic Multiple Living  0 0 0 0 0 0 2    # Outcome Date GA Lbr Len/2nd Weight Sex Delivery Anes PTL Lv  3 Current           2 Term 05/22/13 [redacted]w[redacted]d 05:31 / 03:47 8 lb (3.63 kg) Genella Mech EPI  Y  1 Term 07/12/11 [redacted]w[redacted]d 16:46 / 00:55 7 lb 8.8 oz (3.425 kg) M Vag-Spont None  Y   Comments: none      Past Medical History  Diagnosis Date  . Eczema   . No pertinent past medical history   . Medical history non-contributory   No Known Allergies No current facility-administered medications on file prior to encounter.   Current Outpatient Prescriptions on File Prior to Encounter  Medication Sig Dispense Refill  . misoprostol (CYTOTEC) 200 MCG tablet Take 4 tablets (800 mcg total) by mouth once. INSERT INTO VAGINA ALL 4 tablets all at once for miscarriage 4 tablet 1  . oxyCODONE-acetaminophen (PERCOCET/ROXICET) 5-325 MG tablet Take 1-2 tablets by mouth every 6 (six) hours as needed. (Patient taking differently: Take 1-2 tablets by mouth every 6 (six) hours as needed for moderate pain or severe pain. )  20 tablet 0  . promethazine (PHENERGAN) 25 MG tablet Take 1 tablet (25 mg total) by mouth every 6 (six) hours as needed for nausea or vomiting. 20 tablet 0    Filed Vitals:   04/20/15 0850  BP: 115/83  Pulse: 82  Temp: 98.2 F (36.8 C)  Resp: 20     LMP 02/14/2015  CONSTITUTIONAL: Well-developed, well-nourished female in no acute distress.  ENT: External right and left ear normal.  EYES: EOM intact, conjunctivae normal.  MUSCULOSKELETAL: Normal range of motion.  CARDIOVASCULAR: Regular heart rate RESPIRATORY: Normal effort NEUROLOGICAL: Alert and oriented to person, place, and time.  SKIN: Skin is warm and dry. No rash noted. Not diaphoretic. No erythema. No pallor. PSYCH: Normal mood and affect. Normal behavior. Normal judgment and thought content.   Lab Results Last 24 Hours    Results for orders placed or performed in visit on 04/16/15 (from the past 24 hour(s))  hCG, quantitative, pregnancy Status: Abnormal   Collection Time: 04/16/15 10:20 AM  Result Value Ref Range   hCG, Beta Chain, Quant, S 41627 (H) <5 mIU/mL     MDM Discussed patient with Dr. Debroah Loop. He has reviewed the patients images and recent labs. He recommends scheduling patient for D&C. Patient scheduled for D&C with Dr. Debroah Loop on Tuesday, January 31st at 10:15 am. Message sent to [place] Presnell to  inform patient of surgery time and routine pre-procedural instructions.  The risks of D&C discussed with the patient included but were not limited to: bleeding which may require transfusion or reoperation; infection which may require antibiotics; injury to bowel, bladder, ureters or other surrounding organs,thromboembolic phenomenon and other postoperative/anesthesia complications. The patient concurred with the proposed plan, giving informed written consent for the procedure.   Patient has been NPO since 0000 she will remain NPO for procedure. Anesthesia and OR aware. Preoperative prophylactic  antibiotics and SCDs ordered on call to the OR.  To OR when ready.   A: Insignificant decline in hCG after Cytotec Missed AB  P: Discharge home Bleeding precautions discussed Patient scheduled for D&C on Tuesday.  Patient may return to MAU as needed or if her condition were to change or worsen         Adam Phenix, MD 04/20/2015

## 2015-04-20 NOTE — Op Note (Signed)
Paula Stark PROCEDURE DATE: 04/20/2015  PREOPERATIVE DIAGNOSIS:[redacted]w[redacted]d week missed abortion POSTOPERATIVE DIAGNOSIS: The same PROCEDURE:     Dilation and Evacuation SURGEON:  Adam Phenix, MD   INDICATIONS: 25 y.o. [phone] with MAB at 9.[redacted] weeks gestation, needing surgical completion.  Risks of surgery were discussed with the patient including but not limited to: bleeding which may require transfusion; infection which may require antibiotics; injury to uterus or surrounding organs; need for additional procedures including laparotomy or laparoscopy; possibility of intrauterine scarring which may impair future fertility; and other postoperative/anesthesia complications. Written informed consent was obtained.    FINDINGS:  A 8 week size uterus, moderate amounts of products of conception, specimen sent to pathology.  ANESTHESIA:    General endotracheal INTRAVENOUS FLUIDS:  1000 ml of LR ESTIMATED BLOOD LOSS:  Less than 20 ml. SPECIMENS:  Products of conception sent to pathology COMPLICATIONS:  None immediate.  PROCEDURE DETAILS:  The patient received intravenous Doxycycline while in the preoperative area.  She was then taken to the operating room where monitored intravenous sedation was administered and was found to be adequate.  After an adequate timeout was performed, she was placed in the dorsal lithotomy position and examined; then prepped and draped in the sterile manner.   Her bladder was catheterized for an unmeasured amount of clear, yellow urine. A vaginal speculum was then placed in the patient's vagina and a single tooth tenaculum was applied to the anterior lip of the cervix. The cervix was gently dilated to accommodate a 9 mm suction curette that was gently advanced to the uterine fundus.  The suction device was then activated and curette slowly rotated to clear the uterus of products of conception. Complete emptying of the uterus was confirmed. There was minimal bleeding noted and the  tenaculum removed with good hemostasis noted.   All instruments were removed from the patient's vagina.  Sponge and instrument counts were correct times two  The patient tolerated the procedure well and was taken to the recovery area awake, and in stable condition.  The patient will be discharged to home as per PACU criteria.  Routine postoperative instructions given.  She was prescribed Percocet, Ibuprofen and Colace.  She will follow up in the clinic in 2 weeks for postoperative evaluation.   Adam Phenix, MD 04/20/2015 9:58 AM  Attending Obstetrician & Gynecologist Faculty Practice, [facility]

## 2015-04-20 NOTE — Anesthesia Postprocedure Evaluation (Signed)
Anesthesia Post Note  Patient: Paula Stark  Procedure(s) Performed: Procedure(s) (LRB): DILATATION AND EVACUATION (N/A)  Patient location during evaluation: PACU Anesthesia Type: General Level of consciousness: awake and alert Pain management: pain level controlled Vital Signs Assessment: post-procedure vital signs reviewed and stable Respiratory status: spontaneous breathing, nonlabored ventilation and respiratory function stable Cardiovascular status: blood pressure returned to baseline and stable Postop Assessment: no signs of nausea or vomiting Anesthetic complications: no    Last Vitals:  Filed Vitals:   04/20/15 1015 04/20/15 1030  BP: 107/69 100/65  Pulse: 83 76  Temp:    Resp: 22 20    Last Pain: There were no vitals filed for this visit.               Ninamarie Keel A

## 2015-04-21 ENCOUNTER — Encounter (HOSPITAL_COMMUNITY): Payer: Self-pay | Admitting: Obstetrics & Gynecology

## 2015-05-10 ENCOUNTER — Ambulatory Visit (INDEPENDENT_AMBULATORY_CARE_PROVIDER_SITE_OTHER): Payer: Medicaid Other | Admitting: Obstetrics & Gynecology

## 2015-05-10 ENCOUNTER — Encounter: Payer: Self-pay | Admitting: Obstetrics & Gynecology

## 2015-05-10 VITALS — BP 118/71 | HR 87 | Temp 97.6°F | Ht 59.45 in | Wt 166.1 lb

## 2015-05-10 DIAGNOSIS — O039 Complete or unspecified spontaneous abortion without complication: Secondary | ICD-10-CM

## 2015-05-10 DIAGNOSIS — K649 Unspecified hemorrhoids: Secondary | ICD-10-CM | POA: Diagnosis not present

## 2015-05-10 DIAGNOSIS — Z9889 Other specified postprocedural states: Secondary | ICD-10-CM | POA: Diagnosis not present

## 2015-05-10 MED ORDER — PRENATAL VITAMINS 0.8 MG PO TABS: 1.0000 | ORAL_TABLET | Freq: Every day | ORAL | Status: DC

## 2015-05-10 MED ORDER — HYDROCORTISONE ACETATE 25 MG RE SUPP: 25.0000 mg | Freq: Two times a day (BID) | RECTAL | Status: DC

## 2015-05-10 NOTE — Progress Notes (Signed)
Subjective:3 weeks postop form D&C     Paula Stark is a 25 y.o. female who presents to the clinic 3 weeks status post suction D&C for 9.3 week SAB for fetal demise. Eating a regular diet without difficulty. Bowel movements are normal. The patient is not having any pain.  The following portions of the patient's history were reviewed and updated as appropriate: allergies, current medications, past family history, past medical history, past social history, past surgical history and problem list.  Review of Systems Pertinent items are noted in HPI.    Objective:    BP 118/71 mmHg  Pulse 87  Temp(Src) 97.6 F (36.4 C) (Oral)  Ht 4' 11.45" (1.51 m)  Wt 166 lb 1.6 oz (75.342 kg)  BMI 33.04 kg/m2  LMP 02/14/2015  Breastfeeding? No General:  alert, cooperative and no distress  Abdomen: Not distended  Incision:   n/a     Assessment:    Doing well postoperatively. Operative findings again reviewed. Pathology report discussed. benign POC   Plan:    1. Continue any current medications. 2. Wound care n/a 3. Activity restrictions: none 4. Anticipated return to work: now. 5. Follow up: prn. Rx Prenatal vitamin and Anusol HC for hemorrhoid sx   Adam Phenix, MD 05/10/2015

## 2015-05-10 NOTE — Patient Instructions (Signed)
Miscarriage  A miscarriage is the sudden loss of an unborn baby (fetus) before the 20th week of pregnancy. Most miscarriages happen in the first 3 months of pregnancy. Sometimes, it happens before a woman even knows she is pregnant. A miscarriage is also called a "spontaneous miscarriage" or "early pregnancy loss." Having a miscarriage can be an emotional experience. Talk with your caregiver about any questions you may have about miscarrying, the grieving process, and your future pregnancy plans.  CAUSES    Problems with the fetal chromosomes that make it impossible for the baby to develop normally. Problems with the baby's genes or chromosomes are most often the result of errors that occur, by chance, as the embryo divides and grows. The problems are not inherited from the parents.   Infection of the cervix or uterus.    Hormone problems.    Problems with the cervix, such as having an incompetent cervix. This is when the tissue in the cervix is not strong enough to hold the pregnancy.    Problems with the uterus, such as an abnormally shaped uterus, uterine fibroids, or congenital abnormalities.    Certain medical conditions.    Smoking, drinking alcohol, or taking illegal drugs.    Trauma.   Often, the cause of a miscarriage is unknown.   SYMPTOMS    Vaginal bleeding or spotting, with or without cramps or pain.   Pain or cramping in the abdomen or lower back.   Passing fluid, tissue, or blood clots from the vagina.  DIAGNOSIS   Your caregiver will perform a physical exam. You may also have an ultrasound to confirm the miscarriage. Blood or urine tests may also be ordered.  TREATMENT    Sometimes, treatment is not necessary if you naturally pass all the fetal tissue that was in the uterus. If some of the fetus or placenta remains in the body (incomplete miscarriage), tissue left behind may become infected and must be removed. Usually, a dilation and curettage (D and C) procedure is performed.  During a D and C procedure, the cervix is widened (dilated) and any remaining fetal or placental tissue is gently removed from the uterus.   Antibiotic medicines are prescribed if there is an infection. Other medicines may be given to reduce the size of the uterus (contract) if there is a lot of bleeding.   If you have Rh negative blood and your baby was Rh positive, you will need a Rh immunoglobulin shot. This shot will protect any future baby from having Rh blood problems in future pregnancies.  HOME CARE INSTRUCTIONS    Your caregiver may order bed rest or may allow you to continue light activity. Resume activity as directed by your caregiver.   Have someone help with home and family responsibilities during this time.    Keep track of the number of sanitary pads you use each day and how soaked (saturated) they are. Write down this information.    Do not use tampons. Do not douche or have sexual intercourse until approved by your caregiver.    Only take over-the-counter or prescription medicines for pain or discomfort as directed by your caregiver.    Do not take aspirin. Aspirin can cause bleeding.    Keep all follow-up appointments with your caregiver.    If you or your partner have problems with grieving, talk to your caregiver or seek counseling to help cope with the pregnancy loss. Allow enough time to grieve before trying to get pregnant again.     SEEK IMMEDIATE MEDICAL CARE IF:    You have severe cramps or pain in your back or abdomen.   You have a fever.   You pass large blood clots (walnut-sized or larger) ortissue from your vagina. Save any tissue for your caregiver to inspect.    Your bleeding increases.    You have a thick, bad-smelling vaginal discharge.   You become lightheaded, weak, or you faint.    You have chills.   MAKE SURE YOU:   Understand these instructions.   Will watch your condition.   Will get help right away if you are not doing well or get worse.     This  information is not intended to replace advice given to you by your health care provider. Make sure you discuss any questions you have with your health care provider.     Document Released: 08/30/2000 Document Revised: 07/01/2012 Document Reviewed: 04/25/2011  Elsevier Interactive Patient Education 2016 [employer]

## 2015-08-20 ENCOUNTER — Encounter (HOSPITAL_COMMUNITY): Payer: Self-pay | Admitting: *Deleted

## 2015-08-20 ENCOUNTER — Inpatient Hospital Stay (HOSPITAL_COMMUNITY)
Admission: AD | Admit: 2015-08-20 | Discharge: 2015-08-20 | Disposition: A | Discharge status: Home / Self Care | Payer: Medicaid Other | Source: Ambulatory Visit | Attending: Obstetrics and Gynecology | Admitting: Obstetrics and Gynecology

## 2015-08-20 DIAGNOSIS — R109 Unspecified abdominal pain: Secondary | ICD-10-CM | POA: Diagnosis present

## 2015-08-20 DIAGNOSIS — O209 Hemorrhage in early pregnancy, unspecified: Secondary | ICD-10-CM | POA: Insufficient documentation

## 2015-08-20 DIAGNOSIS — O9989 Other specified diseases and conditions complicating pregnancy, childbirth and the puerperium: Secondary | ICD-10-CM | POA: Diagnosis not present

## 2015-08-20 DIAGNOSIS — M549 Dorsalgia, unspecified: Secondary | ICD-10-CM | POA: Diagnosis not present

## 2015-08-20 DIAGNOSIS — N898 Other specified noninflammatory disorders of vagina: Secondary | ICD-10-CM | POA: Diagnosis not present

## 2015-08-20 DIAGNOSIS — R102 Pelvic and perineal pain: Secondary | ICD-10-CM | POA: Insufficient documentation

## 2015-08-20 DIAGNOSIS — Z3A11 11 weeks gestation of pregnancy: Secondary | ICD-10-CM | POA: Diagnosis not present

## 2015-08-20 DIAGNOSIS — O26891 Other specified pregnancy related conditions, first trimester: Secondary | ICD-10-CM | POA: Diagnosis not present

## 2015-08-20 DIAGNOSIS — O99891 Other specified diseases and conditions complicating pregnancy: Secondary | ICD-10-CM

## 2015-08-20 DIAGNOSIS — O26899 Other specified pregnancy related conditions, unspecified trimester: Secondary | ICD-10-CM

## 2015-08-20 LAB — TYPE AND SCREEN
ABO/RH(D): O POS
Antibody Screen: NEGATIVE

## 2015-08-20 LAB — CBC
HCT: 34.2 % — ABNORMAL LOW (ref 36.0–46.0)
Hemoglobin: 11.6 g/dL — ABNORMAL LOW (ref 12.0–15.0)
MCH: 27.6 pg (ref 26.0–34.0)
MCHC: 33.9 g/dL (ref 30.0–36.0)
MCV: 81.4 fL (ref 78.0–100.0)
Platelets: 236 10*3/uL (ref 150–400)
RBC: 4.2 MIL/uL (ref 3.87–5.11)
RDW: 13.2 % (ref 11.5–15.5)
WBC: 10.1 10*3/uL (ref 4.0–10.5)

## 2015-08-20 LAB — URINALYSIS, ROUTINE W REFLEX MICROSCOPIC
Bilirubin Urine: NEGATIVE
Glucose, UA: NEGATIVE mg/dL
Hgb urine dipstick: NEGATIVE
Ketones, ur: NEGATIVE mg/dL
Nitrite: NEGATIVE
Protein, ur: NEGATIVE mg/dL
Specific Gravity, Urine: 1.015 (ref 1.005–1.030)
pH: 6 (ref 5.0–8.0)

## 2015-08-20 LAB — URINE MICROSCOPIC-ADD ON: RBC / HPF: NONE SEEN RBC/hpf (ref 0–5)

## 2015-08-20 LAB — WET PREP, GENITAL
Clue Cells Wet Prep HPF POC: NONE SEEN
Sperm: NONE SEEN
Trich, Wet Prep: NONE SEEN
Yeast Wet Prep HPF POC: NONE SEEN

## 2015-08-20 LAB — HCG, QUANTITATIVE, PREGNANCY: hCG, Beta Chain, Quant, S: 42305 m[IU]/mL — ABNORMAL HIGH (ref <5)

## 2015-08-20 LAB — POCT PREGNANCY, URINE: Preg Test, Ur: POSITIVE — AB

## 2015-08-20 NOTE — MAU Provider Note (Signed)
MAU HISTORY AND PHYSICAL  Chief Complaint:  Abdominal Pain; Back Pain; and Vaginal Bleeding   Paula Stark is a 25 y.o.  [id number] with IUP at [redacted]w[redacted]d presenting for Abdominal Pain; Back Pain; and Vaginal Bleeding three months intermittent pelvic pain worse with standing and sitting. Stabbing pain. Not every day. Brown scant discharge when wiped today. No fever or dysuria. Currently pain mild. Also low back pain for at least a month. No weakness or numbness or blwe/bladder dysfunction. Does not radiate   Past Medical History  Diagnosis Date  . Eczema   . No pertinent past medical history   . Medical history non-contributory   . Postpartum hemorrhage     Past Surgical History  Procedure Laterality Date  . Dilation and evacuation N/A 04/20/2015    Procedure: DILATATION AND EVACUATION;  Surgeon: Adam Phenix, MD;  Location: [facility];  Service: Gynecology;  Laterality: N/A;    Family History  Problem Relation Age of Onset  . Alcohol abuse Neg Hx   . Arthritis Neg Hx   . Asthma Neg Hx   . Birth defects Neg Hx   . Cancer Neg Hx   . COPD Neg Hx   . Diabetes Neg Hx   . Depression Neg Hx   . Drug abuse Neg Hx   . Early death Neg Hx   . Hearing loss Neg Hx   . Heart disease Neg Hx   . Hyperlipidemia Neg Hx   . Hypertension Neg Hx   . Kidney disease Neg Hx   . Learning disabilities Neg Hx   . Mental illness Neg Hx   . Mental retardation Neg Hx   . Miscarriages / Stillbirths Neg Hx   . Stroke Neg Hx   . Vision loss Neg Hx   . Varicose Veins Neg Hx     Social History  Substance Use Topics  . Smoking status: Never Smoker   . Smokeless tobacco: Never Used  . Alcohol Use: No    No Known Allergies  Prescriptions prior to admission  Medication Sig Dispense Refill Last Dose  . hydrocortisone (ANUSOL-HC) 25 MG suppository Place 1 suppository (25 mg total) rectally 2 (two) times daily. (Patient not taking: Reported on 08/20/2015) 12 suppository 1 Not Taking at Unknown time  . ibuprofen  (ADVIL,MOTRIN) 600 MG tablet Take 1 tablet (600 mg total) by mouth every 6 (six) hours as needed. (Patient not taking: Reported on 05/10/2015) 30 tablet 0 Not Taking at Unknown time  . oxyCODONE-acetaminophen (PERCOCET/ROXICET) 5-325 MG tablet Take 1-2 tablets by mouth every 6 (six) hours as needed. (Patient not taking: Reported on 05/10/2015) 20 tablet 0 Not Taking at Unknown time  . oxyCODONE-acetaminophen (PERCOCET/ROXICET) 5-325 MG tablet Take 1-2 tablets by mouth every 6 (six) hours as needed. (Patient not taking: Reported on 05/10/2015) 12 tablet 0 Not Taking at Unknown time  . Prenatal Multivit-Min-Fe-FA (PRENATAL VITAMINS) 0.8 MG tablet Take 1 tablet by mouth daily. (Patient taking differently: Take 1 tablet by mouth daily. ) 30 tablet 12     Review of Systems - Negative except for what is mentioned in HPI.  Physical Exam  Blood pressure 117/71, pulse 84, temperature 98.2 F (36.8 C), temperature source Oral, resp. rate 18, last menstrual period 02/14/2015. GENERAL: Well-developed, well-nourished female in no acute distress.  LUNGS: Clear to auscultation bilaterally.  HEART: Regular rate and rhythm. ABDOMEN: Soft, nontender, nondistended, gravid.  EXTREMITIES: Nontender, no edema, 2+ distal pulses. GU: white discharge, no blood, normal cervix and vagina,  normal anus   Labs: Results for orders placed or performed during the hospital encounter of 08/20/15 (from the past 24 hour(s))  Urinalysis, Routine w reflex microscopic (not [facility])   Collection Time: 08/20/15 11:48 AM  Result Value Ref Range   Color, Urine YELLOW YELLOW   APPearance HAZY (A) CLEAR   Specific Gravity, Urine 1.015 1.005 - 1.030   pH 6.0 5.0 - 8.0   Glucose, UA NEGATIVE NEGATIVE mg/dL   Hgb urine dipstick NEGATIVE NEGATIVE   Bilirubin Urine NEGATIVE NEGATIVE   Ketones, ur NEGATIVE NEGATIVE mg/dL   Protein, ur NEGATIVE NEGATIVE mg/dL   Nitrite NEGATIVE NEGATIVE   Leukocytes, UA SMALL (A) NEGATIVE  Urine  microscopic-add on   Collection Time: 08/20/15 11:48 AM  Result Value Ref Range   Squamous Epithelial / LPF 6-30 (A) NONE SEEN   WBC, UA 0-5 0 - 5 WBC/hpf   RBC / HPF NONE SEEN 0 - 5 RBC/hpf   Bacteria, UA FEW (A) NONE SEEN  Pregnancy, urine POC   Collection Time: 08/20/15 11:53 AM  Result Value Ref Range   Preg Test, Ur POSITIVE (A) NEGATIVE  Wet prep, genital   Collection Time: 08/20/15 12:48 PM  Result Value Ref Range   Yeast Wet Prep HPF POC NONE SEEN NONE SEEN   Trich, Wet Prep NONE SEEN NONE SEEN   Clue Cells Wet Prep HPF POC NONE SEEN NONE SEEN   WBC, Wet Prep HPF POC MANY (A) NONE SEEN   Sperm NONE SEEN     Imaging Studies:  No results found.  Assessment/Plan: Paula Stark is  25 y.o. G4P2002 at 10025w5d presents with Abdominal Pain; Back Pain; and Vaginal Bleeding  # back pain - no red flag symptoms - tylenol, heat  # pelvic pain - longstanding, mild, intermittent, normal exam, no blood seen on SSE. Very low suspicion for ectopic given that it has been going on for 3 months and has not worsened. - outpt u/s, ectopic return precautions  # Brown discharge - normal exam today, wet prep unremarkable, u/a not suggestive of infection - f/u g/c - ectopic return precautions as above   Silvano Bilisoah B Draxton Luu 6/2/20171:22 PM

## 2015-08-20 NOTE — Discharge Instructions (Signed)

## 2015-08-20 NOTE — MAU Note (Signed)
C/o abdominal cramping started yesterday; c/o back pain at night time for past week; noted a streak of blood when she wiped; had recent SAB;

## 2015-08-23 LAB — GC/CHLAMYDIA PROBE AMP (~~LOC~~) NOT AT ARMC
Chlamydia: NEGATIVE
Neisseria Gonorrhea: NEGATIVE

## 2015-10-07 ENCOUNTER — Ambulatory Visit (INDEPENDENT_AMBULATORY_CARE_PROVIDER_SITE_OTHER): Payer: Medicaid Other | Admitting: Student

## 2015-10-07 ENCOUNTER — Other Ambulatory Visit (HOSPITAL_COMMUNITY)
Admission: RE | Admit: 2015-10-07 | Discharge: 2015-10-07 | Disposition: A | Discharge status: Home / Self Care | Payer: Medicaid Other | Source: Ambulatory Visit | Attending: Family | Admitting: Family

## 2015-10-07 ENCOUNTER — Encounter: Payer: Self-pay | Admitting: Student

## 2015-10-07 VITALS — BP 108/78 | HR 80 | Wt 172.0 lb

## 2015-10-07 DIAGNOSIS — Z3492 Encounter for supervision of normal pregnancy, unspecified, second trimester: Secondary | ICD-10-CM | POA: Diagnosis not present

## 2015-10-07 DIAGNOSIS — Z01419 Encounter for gynecological examination (general) (routine) without abnormal findings: Secondary | ICD-10-CM | POA: Diagnosis not present

## 2015-10-07 DIAGNOSIS — Z124 Encounter for screening for malignant neoplasm of cervix: Secondary | ICD-10-CM

## 2015-10-07 DIAGNOSIS — Z3490 Encounter for supervision of normal pregnancy, unspecified, unspecified trimester: Secondary | ICD-10-CM | POA: Insufficient documentation

## 2015-10-07 DIAGNOSIS — O99212 Obesity complicating pregnancy, second trimester: Secondary | ICD-10-CM | POA: Insufficient documentation

## 2015-10-07 DIAGNOSIS — E669 Obesity, unspecified: Secondary | ICD-10-CM

## 2015-10-07 LAB — POCT URINALYSIS DIP (DEVICE)
Bilirubin Urine: NEGATIVE
Glucose, UA: NEGATIVE mg/dL
Hgb urine dipstick: NEGATIVE
Ketones, ur: NEGATIVE mg/dL
Nitrite: NEGATIVE
Protein, ur: NEGATIVE mg/dL
Specific Gravity, Urine: 1.02 (ref 1.005–1.030)
Urobilinogen, UA: 0.2 mg/dL (ref 0.0–1.0)
pH: 7 (ref 5.0–8.0)

## 2015-10-07 NOTE — Progress Notes (Signed)
  Subjective:    Paula Stark is being seen today for her first obstetrical visit.  This is not a planned pregnancy. She is at 3525w4d gestation. Her obstetrical history is significant for obesity and late to care. Relationship with FOB: spouse, living together. Patient undecided if she will breast feed. Pregnancy history fully reviewed.  Patient reports no complaints.  Review of Systems:   Review of Systems Review of Systems - History obtained from the patient General ROS: negative  Objective:     BP 108/78 mmHg  Pulse 80  Wt 172 lb (78.019 kg)  LMP 05/30/2015 (Exact Date) Physical Exam  Exam Physical Examination: General appearance - alert, well appearing, and in no distress, oriented to person, place, and time and overweight Mental status - alert, oriented to person, place, and time, normal mood, behavior, speech, dress, motor activity, and thought processes Mouth - mucous membranes moist, pharynx normal without lesions and dental hygiene good Neck - supple, no significant adenopathy, thyroid exam: thyroid is normal in size without nodules or tenderness Chest - clear to auscultation, no wheezes, rales or rhonchi, symmetric air entry Heart - normal rate, regular rhythm, normal S1, S2, no murmurs, rubs, clicks or gallops Abdomen - soft, non tender, BS x4, fundal height 18 cm Breasts - breasts appear normal, no suspicious masses, no skin or nipple changes or axillary nodes Pelvic - normal external genitalia, vulva, vagina, cervix, uterus and adnexa, CERVIX: normal appearing cervix without discharge or lesions, Cervix closed, UTERUS: enlarged to 18 week's size, PAP: Pap smear done today, exam chaperoned by nurse tech.  Musculoskeletal - no joint tenderness, deformity or swelling    Assessment:    Pregnancy: [id number] Patient Active Problem List   Diagnosis Date Noted  . Supervision of low-risk pregnancy 10/07/2015       Plan:  1. Supervision of low-risk pregnancy, second  trimester  - Prenatal Profile - Cystic fibrosis diagnostic study - Culture, OB Urine - Pain Mgmt, Profile 6 Conf w/o mM, U - Cytology - PAP - US MFM OB COMP + 14 WK; Future - AFP, Quad Screen   2. Obesity affecting pregnancy in second trimester - Glucose Tolerance, 1 HR (50g)    Initial labs drawn. Prenatal vitamins. Problem list reviewed and updated. AFP3 discussed: ordered. Role of ultrasound in pregnancy discussed; fetal survey: ordered. Amniocentesis discussed: not indicated. Follow up in 4 weeks.    Judeth Hornrin Jencarlos Nicolson 10/07/2015

## 2015-10-07 NOTE — Patient Instructions (Signed)
Second Trimester of Pregnancy  The second trimester is from week 13 through week 28, month 4 through 6. This is often the time in pregnancy that you feel your best. Often times, morning sickness has lessened or quit. You may have more energy, and you may get hungry more often. Your unborn baby (fetus) is growing rapidly. At the end of the sixth month, he or she is about 9 inches long and weighs about 1½ pounds. You will likely feel the baby move (quickening) between 18 and 20 weeks of pregnancy.  HOME CARE   · Avoid all smoking, herbs, and alcohol. Avoid drugs not approved by your doctor.  · Do not use any tobacco products, including cigarettes, chewing tobacco, and electronic cigarettes. If you need help quitting, ask your doctor. You may get counseling or other support to help you quit.  · Only take medicine as told by your doctor. Some medicines are safe and some are not during pregnancy.  · Exercise only as told by your doctor. Stop exercising if you start having cramps.  · Eat regular, healthy meals.  · Wear a good support bra if your breasts are tender.  · Do not use hot tubs, steam rooms, or saunas.  · Wear your seat belt when driving.  · Avoid raw meat, uncooked cheese, and liter boxes and soil used by cats.  · Take your prenatal vitamins.  · Take 1500-2000 milligrams of calcium daily starting at the 20th week of pregnancy until you deliver your baby.  · Try taking medicine that helps you poop (stool softener) as needed, and if your doctor approves. Eat more fiber by eating fresh fruit, vegetables, and whole grains. Drink enough fluids to keep your pee (urine) clear or pale yellow.  · Take warm water baths (sitz baths) to soothe pain or discomfort caused by hemorrhoids. Use hemorrhoid cream if your doctor approves.  · If you have puffy, bulging veins (varicose veins), wear support hose. Raise (elevate) your feet for 15 minutes, 3-4 times a day. Limit salt in your diet.  · Avoid heavy lifting, wear low heals,  and sit up straight.  · Rest with your legs raised if you have leg cramps or low back pain.  · Visit your dentist if you have not gone during your pregnancy. Use a soft toothbrush to brush your teeth. Be gentle when you floss.  · You can have sex (intercourse) unless your doctor tells you not to.  · Go to your doctor visits.  GET HELP IF:   · You feel dizzy.  · You have mild cramps or pressure in your lower belly (abdomen).  · You have a nagging pain in your belly area.  · You continue to feel sick to your stomach (nauseous), throw up (vomit), or have watery poop (diarrhea).  · You have bad smelling fluid coming from your vagina.  · You have pain with peeing (urination).  GET HELP RIGHT AWAY IF:   · You have a fever.  · You are leaking fluid from your vagina.  · You have spotting or bleeding from your vagina.  · You have severe belly cramping or pain.  · You lose or gain weight rapidly.  · You have trouble catching your breath and have chest pain.  · You notice sudden or extreme puffiness (swelling) of your face, hands, ankles, feet, or legs.  · You have not felt the baby move in over an hour.  · You have severe headaches that do   not go away with medicine.  · You have vision changes.     This information is not intended to replace advice given to you by your health care provider. Make sure you discuss any questions you have with your health care provider.     Document Released: 05/31/2009 Document Revised: 03/27/2014 Document Reviewed: 05/07/2012  Elsevier Interactive Patient Education ©2016 [employer]

## 2015-10-07 NOTE — Progress Notes (Signed)
Patient reports off and on back pain

## 2015-10-08 LAB — PRENATAL PROFILE (SOLSTAS)
Antibody Screen: NEGATIVE
Basophils Absolute: 0 cells/uL (ref 0–200)
Basophils Relative: 0 %
Eosinophils Absolute: 204 cells/uL (ref 15–500)
Eosinophils Relative: 2 %
HCT: 34.4 % — ABNORMAL LOW (ref 35.0–45.0)
HIV 1&2 Ab, 4th Generation: NONREACTIVE
Hemoglobin: 11.4 g/dL — ABNORMAL LOW (ref 11.7–15.5)
Hepatitis B Surface Ag: NEGATIVE
Lymphocytes Relative: 21 %
Lymphs Abs: 2142 cells/uL (ref 850–3900)
MCH: 28.2 pg (ref 27.0–33.0)
MCHC: 33.1 g/dL (ref 32.0–36.0)
MCV: 85.1 fL (ref 80.0–100.0)
MPV: 10.3 fL (ref 7.5–12.5)
Monocytes Absolute: 714 cells/uL (ref 200–950)
Monocytes Relative: 7 %
Neutro Abs: 7140 cells/uL (ref 1500–7800)
Neutrophils Relative %: 70 %
Platelets: 268 10*3/uL (ref 140–400)
RBC: 4.04 MIL/uL (ref 3.80–5.10)
RDW: 13.9 % (ref 11.0–15.0)
Rh Type: POSITIVE
Rubella: 5.31 Index — ABNORMAL HIGH (ref <0.90)
WBC: 10.2 10*3/uL (ref 3.8–10.8)

## 2015-10-08 LAB — GLUCOSE TOLERANCE, 1 HOUR (50G) W/O FASTING: Glucose, 1 Hr, gestational: 119 mg/dL (ref <140)

## 2015-10-09 LAB — CULTURE, OB URINE

## 2015-10-10 ENCOUNTER — Other Ambulatory Visit: Payer: Self-pay | Admitting: Family

## 2015-10-10 DIAGNOSIS — O2342 Unspecified infection of urinary tract in pregnancy, second trimester: Secondary | ICD-10-CM

## 2015-10-10 DIAGNOSIS — O2340 Unspecified infection of urinary tract in pregnancy, unspecified trimester: Secondary | ICD-10-CM | POA: Insufficient documentation

## 2015-10-10 MED ORDER — NITROFURANTOIN MONOHYD MACRO 100 MG PO CAPS: 100.0000 mg | ORAL_CAPSULE | Freq: Two times a day (BID) | ORAL | 1 refills | Status: DC

## 2015-10-11 ENCOUNTER — Ambulatory Visit (HOSPITAL_COMMUNITY)
Admission: RE | Admit: 2015-10-11 | Discharge: 2015-10-11 | Disposition: A | Discharge status: Home / Self Care | Payer: Medicaid Other | Source: Ambulatory Visit | Attending: Family | Admitting: Family

## 2015-10-11 DIAGNOSIS — Z3A19 19 weeks gestation of pregnancy: Secondary | ICD-10-CM | POA: Insufficient documentation

## 2015-10-11 DIAGNOSIS — Z36 Encounter for antenatal screening of mother: Secondary | ICD-10-CM | POA: Diagnosis present

## 2015-10-11 DIAGNOSIS — Z3492 Encounter for supervision of normal pregnancy, unspecified, second trimester: Secondary | ICD-10-CM

## 2015-10-11 LAB — CYTOLOGY - PAP

## 2015-10-12 LAB — PAIN MGMT, PROFILE 6 CONF W/O MM, U
6 Acetylmorphine: NEGATIVE ng/mL (ref <10)
Alcohol Metabolites: NEGATIVE ng/mL (ref <500)
Amphetamines: NEGATIVE ng/mL (ref <500)
Barbiturates: NEGATIVE ng/mL (ref <300)
Benzodiazepines: NEGATIVE ng/mL (ref <100)
Cocaine Metabolite: NEGATIVE ng/mL (ref <150)
Creatinine: 50.7 mg/dL (ref >=20.0)
Marijuana Metabolite: NEGATIVE ng/mL (ref <20)
Methadone Metabolite: NEGATIVE ng/mL (ref <100)
Opiates: NEGATIVE ng/mL (ref <100)
Oxidant: NEGATIVE ug/mL (ref <200)
Oxycodone: NEGATIVE ng/mL (ref <100)
Phencyclidine: NEGATIVE ng/mL (ref <25)
Please note:: 0
pH: 6.88 (ref 4.5–9.0)

## 2015-10-13 LAB — AFP, QUAD SCREEN
AFP: 40.6 ng/mL
Age Alone: 1:902 {titer}
Curr Gest Age: 18.6 weeks
Down Syndrome Scr Risk Est: 1:38500 {titer}
HCG, Total: 9.69 IU/mL
INH: 115.2 pg/mL
Interpretation-AFP: NEGATIVE
MoM for AFP: 0.85
MoM for INH: 0.71
MoM for hCG: 0.33
Open Spina bifida: NEGATIVE
Osb Risk: 1:23100 {titer}
Tri 18 Scr Risk Est: NEGATIVE
Trisomy 18 (Edward) Syndrome Interp.: 1:6870 {titer}
uE3 Mom: 1.23
uE3 Value: 1.94 ng/mL

## 2015-10-14 ENCOUNTER — Telehealth: Payer: Self-pay | Admitting: General Practice

## 2015-10-14 LAB — CYSTIC FIBROSIS DIAGNOSTIC STUDY

## 2015-10-14 NOTE — Telephone Encounter (Signed)
Per SNKNLZJ, patient has UTI and antibiotic has been sent to pharmacy. Called patient twice. Phone just rings then disconnects. No voicemail. Will send letter

## 2015-11-10 ENCOUNTER — Ambulatory Visit (INDEPENDENT_AMBULATORY_CARE_PROVIDER_SITE_OTHER): Payer: Medicaid Other | Admitting: Medical

## 2015-11-10 VITALS — BP 106/69 | HR 80 | Wt 176.9 lb

## 2015-11-10 DIAGNOSIS — K5901 Slow transit constipation: Secondary | ICD-10-CM

## 2015-11-10 DIAGNOSIS — Z3492 Encounter for supervision of normal pregnancy, unspecified, second trimester: Secondary | ICD-10-CM

## 2015-11-10 DIAGNOSIS — O2242 Hemorrhoids in pregnancy, second trimester: Secondary | ICD-10-CM

## 2015-11-10 DIAGNOSIS — K649 Unspecified hemorrhoids: Secondary | ICD-10-CM

## 2015-11-10 LAB — POCT URINALYSIS DIP (DEVICE)
Bilirubin Urine: NEGATIVE
Glucose, UA: NEGATIVE mg/dL
Hgb urine dipstick: NEGATIVE
Ketones, ur: NEGATIVE mg/dL
Leukocytes, UA: NEGATIVE
Nitrite: NEGATIVE
Protein, ur: NEGATIVE mg/dL
Specific Gravity, Urine: 1.01 (ref 1.005–1.030)
Urobilinogen, UA: 0.2 mg/dL (ref 0.0–1.0)
pH: 7 (ref 5.0–8.0)

## 2015-11-10 MED ORDER — HYDROCORTISONE ACE-PRAMOXINE 1-1 % RE FOAM: 1.0000 | Freq: Two times a day (BID) | RECTAL | 2 refills | Status: DC

## 2015-11-10 MED ORDER — DOCUSATE SODIUM 250 MG PO CAPS: 250.0000 mg | ORAL_CAPSULE | Freq: Every day | ORAL | 2 refills | Status: DC

## 2015-11-10 NOTE — Progress Notes (Signed)
Subjective:  Paula Stark is a 25 y.o. [id number] at 4475w3d being seen today for ongoing prenatal care.  She is currently monitored for the following issues for this low-risk pregnancy and has Supervision of low-risk pregnancy; Obesity affecting pregnancy in second trimester; and UTI in pregnancy, antepartum on her problem list.  Patient reports no complaints.  Contractions: Not present. Vag. Bleeding: None.  Movement: Present. Denies leaking of fluid.   The following portions of the patient's history were reviewed and updated as appropriate: allergies, current medications, past family history, past medical history, past social history, past surgical history and problem list. Problem list updated.  Objective:   Vitals:   11/10/15 1004  BP: 106/69  Pulse: 80  Weight: 176 lb 14.4 oz (80.2 kg)    Fetal Status: Fetal Heart Rate (bpm): 153 Fundal Height: 23 cm Movement: Present     General:  Alert, oriented and cooperative. Patient is in no acute distress.  Skin: Skin is warm and dry. No rash noted.   Cardiovascular: Normal heart rate noted  Respiratory: Normal respiratory effort, no problems with respiration noted  Abdomen: Soft, gravid, appropriate for gestational age. Pain/Pressure: Absent     Pelvic:  Cervical exam deferred        Extremities: Normal range of motion.  Edema: None  Mental Status: Normal mood and affect. Normal behavior. Normal judgment and thought content.   Urinalysis: Urine Protein: Negative Urine Glucose: Negative  Assessment and Plan:  Pregnancy: [id number] at 3775w3d  1. Supervision of low-risk pregnancy, second trimester - Discussed need for 1 hour GTT and 28 week labs at next visit - Patient will consider TDap at that time  2. Slow transit constipation - hydrocortisone-pramoxine (PROCTOFOAM-HC) rectal foam; Place 1 applicator rectally 2 (two) times daily.  Dispense: 10 g; Refill: 2 - docusate sodium (COLACE) 250 MG capsule; Take 1 capsule (250 mg total) by mouth daily.   Dispense: 10 capsule; Refill: 2  3. Hemorrhoids, unspecified hemorrhoid type - hydrocortisone-pramoxine (PROCTOFOAM-HC) rectal foam; Place 1 applicator rectally 2 (two) times daily.  Dispense: 10 g; Refill: 2  Preterm labor symptoms and general obstetric precautions including but not limited to vaginal bleeding, contractions, leaking of fluid and fetal movement were reviewed in detail with the patient. Please refer to After Visit Summary for other counseling recommendations.  Return in about 4 weeks (around 12/08/2015) for LOB.   Marny LowensteinJulie N Midge Momon, PA-C

## 2015-11-10 NOTE — Patient Instructions (Signed)
Constipation, Adult Constipation is when a person:  Poops (has a bowel movement) less than 3 times a week.  Has a hard time pooping.  Has poop that is dry, hard, or bigger than normal. HOME CARE   Eat foods with a lot of fiber in them. This includes fruits, vegetables, beans, and whole grains such as brown rice.  Avoid fatty foods and foods with a lot of sugar. This includes french fries, hamburgers, cookies, candy, and soda.  If you are not getting enough fiber from food, take products with added fiber in them (supplements).  Drink enough fluid to keep your pee (urine) clear or pale yellow.  Exercise on a regular basis, or as told by your doctor.  Go to the restroom when you feel like you need to poop. Do not hold it.  Only take medicine as told by your doctor. Do not take medicines that help you poop (laxatives) without talking to your doctor first. GET HELP RIGHT AWAY IF:   You have bright red blood in your poop (stool).  Your constipation lasts more than 4 days or gets worse.  You have belly (abdominal) or butt (rectal) pain.  You have thin poop (as thin as a pencil).  You lose weight, and it cannot be explained. MAKE SURE YOU:   Understand these instructions.  Will watch your condition.  Will get help right away if you are not doing well or get worse.   This information is not intended to replace advice given to you by your health care provider. Make sure you discuss any questions you have with your health care provider.   Document Released: 08/23/2007 Document Revised: 03/27/2014 Document Reviewed: 12/16/2012 Elsevier Interactive Patient Education 2016 Elsevier Inc.  

## 2015-11-10 NOTE — Progress Notes (Signed)
Pt c/o hemorrhoids/constipation

## 2015-12-08 ENCOUNTER — Ambulatory Visit (INDEPENDENT_AMBULATORY_CARE_PROVIDER_SITE_OTHER): Payer: Medicaid Other | Admitting: Obstetrics and Gynecology

## 2015-12-08 VITALS — BP 116/70 | HR 93 | Wt 177.6 lb

## 2015-12-08 DIAGNOSIS — E669 Obesity, unspecified: Secondary | ICD-10-CM

## 2015-12-08 DIAGNOSIS — Z23 Encounter for immunization: Secondary | ICD-10-CM

## 2015-12-08 DIAGNOSIS — O2242 Hemorrhoids in pregnancy, second trimester: Secondary | ICD-10-CM

## 2015-12-08 DIAGNOSIS — O99212 Obesity complicating pregnancy, second trimester: Secondary | ICD-10-CM

## 2015-12-08 DIAGNOSIS — K5901 Slow transit constipation: Secondary | ICD-10-CM

## 2015-12-08 DIAGNOSIS — K649 Unspecified hemorrhoids: Secondary | ICD-10-CM

## 2015-12-08 DIAGNOSIS — Z3493 Encounter for supervision of normal pregnancy, unspecified, third trimester: Secondary | ICD-10-CM

## 2015-12-08 LAB — CBC
HCT: 34.1 % — ABNORMAL LOW (ref 35.0–45.0)
Hemoglobin: 11.4 g/dL — ABNORMAL LOW (ref 11.7–15.5)
MCH: 28.5 pg (ref 27.0–33.0)
MCHC: 33.4 g/dL (ref 32.0–36.0)
MCV: 85.3 fL (ref 80.0–100.0)
MPV: 10 fL (ref 7.5–12.5)
Platelets: 214 10*3/uL (ref 140–400)
RBC: 4 MIL/uL (ref 3.80–5.10)
RDW: 13.4 % (ref 11.0–15.0)
WBC: 9.9 10*3/uL (ref 3.8–10.8)

## 2015-12-08 LAB — POCT URINALYSIS DIP (DEVICE)
Bilirubin Urine: NEGATIVE
Glucose, UA: NEGATIVE mg/dL
Hgb urine dipstick: NEGATIVE
Ketones, ur: NEGATIVE mg/dL
Leukocytes, UA: NEGATIVE
Nitrite: NEGATIVE
Protein, ur: NEGATIVE mg/dL
Specific Gravity, Urine: 1.01 (ref 1.005–1.030)
Urobilinogen, UA: 0.2 mg/dL (ref 0.0–1.0)
pH: 6.5 (ref 5.0–8.0)

## 2015-12-08 MED ORDER — HYDROCORTISONE ACE-PRAMOXINE 1-1 % RE FOAM: 1.0000 | Freq: Two times a day (BID) | RECTAL | 2 refills | Status: DC

## 2015-12-08 MED ORDER — HYDROCORTISONE ACE-PRAMOXINE 1-1 % RE FOAM: 1.0000 | Freq: Two times a day (BID) | RECTAL | 6 refills | Status: DC

## 2015-12-08 NOTE — Addendum Note (Signed)
Addended by: Cheree DittoGRAHAM, Hasaan Radde A on: 12/08/2015 11:54 AM   Modules accepted: Orders

## 2015-12-08 NOTE — Progress Notes (Signed)
   PRENATAL VISIT NOTE  Subjective:  Paula Stark is a 25 y.o. Z6X0960G4P2012 at 7234w3d being seen today for ongoing prenatal care.  She is currently monitored for the following issues for this low-risk pregnancy and has Supervision of low-risk pregnancy; Obesity affecting pregnancy in second trimester; and UTI in pregnancy, antepartum on her problem list.  Patient reports no complaints.  Contractions: Not present.  .  Movement: Present. Denies leaking of fluid.   The following portions of the patient's history were reviewed and updated as appropriate: allergies, current medications, past family history, past medical history, past social history, past surgical history and problem list. Problem list updated.  Objective:   Vitals:   12/08/15 1017  BP: 116/70  Pulse: 93  Weight: 177 lb 9.6 oz (80.6 kg)    Fetal Status: Fetal Heart Rate (bpm): 164 Fundal Height: 27 cm Movement: Present     General:  Alert, oriented and cooperative. Patient is in no acute distress.  Skin: Skin is warm and dry. No rash noted.   Cardiovascular: Normal heart rate noted  Respiratory: Normal respiratory effort, no problems with respiration noted  Abdomen: Soft, gravid, appropriate for gestational age. Pain/Pressure: Present     Pelvic:  Cervical exam deferred        Extremities: Normal range of motion.  Edema: None  Mental Status: Normal mood and affect. Normal behavior. Normal judgment and thought content.   Urinalysis:      Assessment and Plan:  Pregnancy: [id number] at 6234w3d  1. Slow transit constipation Refill on proctofoam provided - hydrocortisone-pramoxine (PROCTOFOAM-HC) rectal foam; Place 1 applicator rectally 2 (two) times daily.  Dispense: 10 g; Refill: 6  2. Hemorrhoids, unspecified hemorrhoid type  - hydrocortisone-pramoxine (PROCTOFOAM-HC) rectal foam; Place 1 applicator rectally 2 (two) times daily.  Dispense: 10 g; Refill: 6  3. Supervision of low-risk pregnancy, third trimester Patient is doing  well without complaints Third trimester labs today - RPR - CBC - HIV antibody - Glucose Tolerance, 1 HR (50g)  4. Obesity affecting pregnancy in second trimester   Preterm labor symptoms and general obstetric precautions including but not limited to vaginal bleeding, contractions, leaking of fluid and fetal movement were reviewed in detail with the patient. Please refer to After Visit Summary for other counseling recommendations.  No Follow-up on file.  Catalina AntiguaPeggy Lazar Tierce, MD

## 2015-12-09 ENCOUNTER — Telehealth: Payer: Self-pay

## 2015-12-09 LAB — HIV ANTIBODY (ROUTINE TESTING W REFLEX): HIV 1&2 Ab, 4th Generation: NONREACTIVE

## 2015-12-09 LAB — RPR

## 2015-12-09 LAB — GLUCOSE TOLERANCE, 1 HOUR (50G) W/O FASTING: Glucose, 1 Hr, gestational: 145 mg/dL — ABNORMAL HIGH (ref <140)

## 2015-12-09 NOTE — Telephone Encounter (Signed)
Patient has been informed of abnormal gtt results. She will follow up tomorrow for 3 hour gtt.

## 2015-12-10 ENCOUNTER — Other Ambulatory Visit: Payer: Medicaid Other

## 2015-12-14 ENCOUNTER — Other Ambulatory Visit: Payer: Medicaid Other

## 2015-12-14 DIAGNOSIS — O99810 Abnormal glucose complicating pregnancy: Secondary | ICD-10-CM

## 2015-12-15 LAB — GLUCOSE TOLERANCE, 3 HOURS
Glucose Tolerance, 1 hour: 186 mg/dL (ref <190)
Glucose Tolerance, 2 hour: 149 mg/dL (ref <165)
Glucose Tolerance, Fasting: 80 mg/dL (ref 65–104)
Glucose, GTT - 3 Hour: 113 mg/dL (ref <145)

## 2015-12-28 ENCOUNTER — Encounter (HOSPITAL_COMMUNITY): Payer: Self-pay | Admitting: Emergency Medicine

## 2015-12-28 ENCOUNTER — Ambulatory Visit (INDEPENDENT_AMBULATORY_CARE_PROVIDER_SITE_OTHER): Payer: Medicaid Other | Admitting: Family

## 2015-12-28 ENCOUNTER — Emergency Department (HOSPITAL_COMMUNITY)
Admission: EM | Admit: 2015-12-28 | Discharge: 2015-12-28 | Disposition: A | Discharge status: Home / Self Care | Payer: Medicaid Other | Attending: Physician Assistant | Admitting: Physician Assistant

## 2015-12-28 VITALS — BP 126/79 | HR 97 | Wt 180.1 lb

## 2015-12-28 DIAGNOSIS — Z3483 Encounter for supervision of other normal pregnancy, third trimester: Secondary | ICD-10-CM

## 2015-12-28 DIAGNOSIS — K644 Residual hemorrhoidal skin tags: Secondary | ICD-10-CM | POA: Diagnosis not present

## 2015-12-28 DIAGNOSIS — O2243 Hemorrhoids in pregnancy, third trimester: Secondary | ICD-10-CM

## 2015-12-28 DIAGNOSIS — K6289 Other specified diseases of anus and rectum: Secondary | ICD-10-CM

## 2015-12-28 DIAGNOSIS — Z3493 Encounter for supervision of normal pregnancy, unspecified, third trimester: Secondary | ICD-10-CM

## 2015-12-28 NOTE — ED Triage Notes (Signed)
Pt sts rectal pain from hemorrhoids with hx of same; pt is [redacted] weeks pregnant with no complications

## 2015-12-28 NOTE — Progress Notes (Signed)
   PRENATAL VISIT NOTE  Subjective:  Paula Stark is a 25 y.o. [id number] at 6170w2d being seen today for ongoing prenatal care.  She is currently monitored for the following issues for this low-risk pregnancy and has Supervision of low-risk pregnancy; Obesity affecting pregnancy in second trimester; UTI in pregnancy, antepartum; and Hemorrhoids during pregnancy in third trimester on her problem list.  Patient reports ongoing problem with hemorrhoids.  Feels increased pain in rectum.  No report of bleeding.  Pt states feeling increased pain deep inside rectum.   Contractions: Not present. Vag. Bleeding: None.  Movement: Present. Denies leaking of fluid.   The following portions of the patient's history were reviewed and updated as appropriate: allergies, current medications, past family history, past medical history, past social history, past surgical history and problem list. Problem list updated.  Objective:   Vitals:   12/28/15 1113  BP: 126/79  Pulse: 97  Weight: 180 lb 1.6 oz (81.7 kg)    Fetal Status: Fetal Heart Rate (bpm): 145 Fundal Height: 31 cm Movement: Present     General:  Alert, oriented and cooperative. Patient is in no acute distress.  Skin: Skin is warm and dry. No rash noted.   Cardiovascular: Normal heart rate noted  Respiratory: Normal respiratory effort, no problems with respiration noted  Abdomen: Soft, gravid, appropriate for gestational age. Pain/Pressure: Absent     Pelvic:  Cervical exam deferred        Rectum Small external hemorrhoid seen; no hemorrhoids palpated internally  Extremities: Normal range of motion.  Edema: None  Mental Status: Normal mood and affect. Normal behavior. Normal judgment and thought content.   Urinalysis:      Assessment and Plan:  Pregnancy: A5W0981G4P2012 at 6070w2d  1. Encounter for supervision of low-risk pregnancy in third trimester - Reviewed 3 hr results > nml   2.  Hemorrhoids during pregnancy in third trimester - Ambulatory referral  to Gastroenterology (per pt request)  Preterm labor symptoms and general obstetric precautions including but not limited to vaginal bleeding, contractions, leaking of fluid and fetal movement were reviewed in detail with the patient. Please refer to After Visit Summary for other counseling recommendations.  Return in 2 weeks (on 01/11/2016).  Eino FarberWalidah Kennith GainN Karim, CNM

## 2015-12-28 NOTE — ED Provider Notes (Signed)
[facility]-EMERGENCY DEPT Provider Note   CSN: [id number] Arrival date & time: 12/28/15  1220     History   Chief Complaint Chief Complaint  Patient presents with  . Rectal Pain    HPI Paula Stark is a 25 y.o. [id number] @ 5261w4d gestation who presents to the ED with rectal pain. She was just at her OB office and examined and told she had a small hemorrhoid and is using proctofoam and taking colace. She is here for a second opinion. She denies any other problems or OB complaints.   HPI  Past Medical History:  Diagnosis Date  . Eczema   . Postpartum hemorrhage     Patient Active Problem List   Diagnosis Date Noted  . Hemorrhoids during pregnancy in third trimester 12/28/2015  . UTI in pregnancy, antepartum 10/10/2015  . Supervision of low-risk pregnancy 10/07/2015  . Obesity affecting pregnancy in second trimester 10/07/2015    Past Surgical History:  Procedure Laterality Date  . DILATION AND EVACUATION N/A 04/20/2015   Procedure: DILATATION AND EVACUATION;  Surgeon: Adam PhenixJames G Arnold, MD;  Location: [facility];  Service: Gynecology;  Laterality: N/A;    OB History    Gravida Para Term Preterm AB Living   4 2 2  0 1 2   SAB TAB Ectopic Multiple Live Births   1 0 0 0 2       Home Medications    Prior to Admission medications   Medication Sig Start Date End Date Taking? Authorizing Provider  docusate sodium (COLACE) 250 MG capsule Take 1 capsule (250 mg total) by mouth daily. 11/10/15   Marny LowensteinJulie N Wenzel, PA-C  hydrocortisone-pramoxine [facility](PROCTOFOAM-HC) rectal foam Place 1 applicator rectally 2 (two) times daily. 12/08/15   Peggy Constant, MD  Prenatal Multivit-Min-Fe-FA (PRENATAL VITAMINS) 0.8 MG tablet Take 1 tablet by mouth daily. Patient taking differently: Take 1 tablet by mouth daily.  05/10/15   Adam PhenixJames G Arnold, MD    Family History History reviewed. No pertinent family history.  Social History Social History  Substance Use Topics  . Smoking status: Never Smoker  . Smokeless  tobacco: Never Used  . Alcohol use No     Allergies   Review of patient's allergies indicates no known allergies.   Review of Systems Review of Systems  Gastrointestinal:       Gravid at [redacted] weeks gestation  Genitourinary:       Rectal pain  all other systems negative   Physical Exam Updated Vital Signs BP 111/70 (BP Location: Left Arm)   Pulse 87   Temp 98.3 F (36.8 C) (Oral)   Resp 16   Ht 5\' 1"  (1.549 m)   Wt 84.8 kg   LMP 05/30/2015 (Exact Date)   SpO2 99%   BMI 35.33 kg/m   Physical Exam  Constitutional: She is oriented to person, place, and time. She appears well-developed and well-nourished. No distress.  Eyes: EOM are normal.  Neck: Neck supple.  Cardiovascular: Normal rate.   Pulmonary/Chest: Effort normal.  Abdominal: Soft. There is no tenderness.  Gravid consistent with dates, positive doppler FHT's.  Genitourinary: Rectal exam shows external hemorrhoid. Rectal exam shows no fissure and no mass. Tenderness: mild.  Genitourinary Comments: Tiny external hemorrhoid visualized, no rectal tears.  Musculoskeletal: Normal range of motion.  Neurological: She is alert and oriented to person, place, and time. No cranial nerve deficit.  Skin: Skin is warm and dry.  Psychiatric: She has a normal mood and affect. Her behavior is normal.  Nursing note and vitals reviewed.    ED Treatments / Results  Labs (all labs ordered are listed, but only abnormal results are displayed) Labs Reviewed - No data to display  Radiology No results found.  Procedures Procedures (including critical care time)  Medications Ordered in ED Medications - No data to display   Initial Impression / Assessment and Plan / ED Course  I have reviewed the triage vital signs and the nursing notes.  Pertinent labs & imaging results that were available during my care of the patient were reviewed by me and considered in my medical decision making (see chart for details).  Clinical  Course   Spoke with the patient's OB who states that because of the patient's extreme concern that they are in the process of scheduling the patient with GI for further evaluation of her rectal pain.   Final Clinical Impressions(s) / ED Diagnoses  25 y.o. female with rectal pain and small hemorrhoid external. Discussed with the patient same information she had been given earlier while at her OB visit. Patient will await call from their off for appointment with the GI doctor. She will continue to use the proctofoam as directed  Final diagnoses:  Rectal pain  External hemorrhoid    New Prescriptions Discharge Medication List as of 12/28/2015  2:40 PM       Janne Napoleon, NP 12/30/15 1248    Courteney Randall An, MD 01/05/16 [phone]

## 2015-12-28 NOTE — ED Notes (Signed)
EDP at bedside  

## 2015-12-28 NOTE — Discharge Instructions (Signed)
Continue to use the medication that your doctor gave you. I spoke with your doctor and they are in the process of getting you scheduled with a GI doctor for further evaluation of your rectal pain. They will call you when the appointment is scheduled.

## 2015-12-30 ENCOUNTER — Telehealth: Payer: Self-pay

## 2015-12-30 NOTE — Telephone Encounter (Signed)
LM in regards to pt referral to GI to return callback to office.  Re: Need to inform pt that GI does not accept pregnancy Medicaid that she would be considered to be self pay.

## 2015-12-31 NOTE — Telephone Encounter (Signed)
Notified pt that the GI specialist does not take pregnancy Medicaid so she would have to pay out of pocket to see the specialist.  I asked pt if she would get regular Medicaid after delivery she stated that she will apply.  Pt stated that she wanted to wait until after delivery before she does the GI referral.

## 2015-12-31 NOTE — Telephone Encounter (Signed)
Paula Stark called back this am and left a message she missed a call from nurse Jeanetta yesterday and so she is calling back.

## 2016-01-04 ENCOUNTER — Other Ambulatory Visit: Payer: Self-pay | Admitting: Obstetrics and Gynecology

## 2016-01-04 DIAGNOSIS — K649 Unspecified hemorrhoids: Secondary | ICD-10-CM

## 2016-01-04 DIAGNOSIS — K5901 Slow transit constipation: Secondary | ICD-10-CM

## 2016-01-10 ENCOUNTER — Ambulatory Visit (INDEPENDENT_AMBULATORY_CARE_PROVIDER_SITE_OTHER): Payer: Medicaid Other | Admitting: Obstetrics and Gynecology

## 2016-01-10 VITALS — BP 111/71 | HR 92 | Wt 180.6 lb

## 2016-01-10 DIAGNOSIS — Z3493 Encounter for supervision of normal pregnancy, unspecified, third trimester: Secondary | ICD-10-CM

## 2016-01-10 DIAGNOSIS — O2340 Unspecified infection of urinary tract in pregnancy, unspecified trimester: Secondary | ICD-10-CM

## 2016-01-10 DIAGNOSIS — K5901 Slow transit constipation: Secondary | ICD-10-CM

## 2016-01-10 DIAGNOSIS — O2243 Hemorrhoids in pregnancy, third trimester: Secondary | ICD-10-CM

## 2016-01-10 DIAGNOSIS — O2343 Unspecified infection of urinary tract in pregnancy, third trimester: Secondary | ICD-10-CM

## 2016-01-10 DIAGNOSIS — K649 Unspecified hemorrhoids: Secondary | ICD-10-CM

## 2016-01-10 MED ORDER — PSYLLIUM 28 % PO PACK: 1.0000 | PACK | Freq: Every day | ORAL | 3 refills | Status: DC

## 2016-01-10 MED ORDER — HYDROCORTISONE ACE-PRAMOXINE 1-1 % RE FOAM: 1.0000 | Freq: Two times a day (BID) | RECTAL | 2 refills | Status: DC

## 2016-01-10 NOTE — Progress Notes (Signed)
Needs refill on proctofoam

## 2016-01-10 NOTE — Progress Notes (Signed)
Prenatal Visit Note Date: 01/10/2016 [facility]  Subjective:  Paula Stark is a 25 y.o. [id number] at 3518w1d being seen today for ongoing prenatal care.  She is currently monitored for the following issues for this low-risk pregnancy and has Supervision of low-risk pregnancy; Obesity affecting pregnancy in second trimester; UTI in pregnancy, antepartum; and Hemorrhoids during pregnancy in third trimester on her problem list.  Patient reports some internal rectal burning and discomfort with BMs and sometimes when not having a BM. Patient denies any rectal bleeding or constipation or having to strain. Patient states she has a BM qday.  Contractions: Not present. Vag. Bleeding: None.  Movement: Present. Denies leaking of fluid.   The following portions of the patient's history were reviewed and updated as appropriate: allergies, current medications, past family history, past medical history, past social history, past surgical history and problem list. Problem list updated.  Objective:   Vitals:   01/10/16 1041  BP: 111/71  Pulse: 92  Weight: 180 lb 9.6 oz (81.9 kg)    Fetal Status: Fetal Heart Rate (bpm): 147 Fundal Height: 32 cm Movement: Present  Presentation: Vertex  General:  Alert, oriented and cooperative. Patient is in no acute distress.  Skin: Skin is warm and dry. No rash noted.   Cardiovascular: Normal heart rate noted  Respiratory: Normal respiratory effort, no problems with respiration noted  Abdomen: Soft, gravid, appropriate for gestational age. Pain/Pressure: Present     Pelvic:  Cervical exam deferred        Extremities: Normal range of motion.  Edema: None  Mental Status: Normal mood and affect. Normal behavior. Normal judgment and thought content.   Small, deflated (<1cm) hemorrhoid seen externally. Vaginal exam done and slightly ttp at 7 o'clock of the posterior vaginal wall approximately 4-5cm from the introitus.   Patient able to sit  comfortably   Urinalysis:      Assessment and Plan:  Pregnancy: [id number] at 5618w1d  1.  Hemorrhoids, unspecified hemorrhoid type Refill for proctofoam and I also sent in metamucil to help with BMs. I d/w her that her s/s don't appear bad enough that GI or [facility] would do anything different than medication management and defer consideration for any procedure until 6wks PP, given that she's pregnant. I d/w her that if her s/s worsen then referral would be indicated but a high threshold would be needed. She is amenable to plan. Interpreter used.   - hydrocortisone-pramoxine (PROCTOFOAM-HC) rectal foam; Place 1 applicator rectally 2 (two) times daily.  Dispense: 10 g; Refill: 2  3. Encounter for supervision of low-risk pregnancy in third trimester Normal 28wk labs and 3hr GTT  4. UTI in pregnancy, antepartum toc today - Urine Culture  Preterm labor symptoms and general obstetric precautions including but not limited to vaginal bleeding, contractions, leaking of fluid and fetal movement were reviewed in detail with the patient. Please refer to After Visit Summary for other counseling recommendations.  Return in about 2 weeks (around 01/24/2016).   [place] Bingharlie Romeka Scifres, MD

## 2016-01-12 LAB — URINE CULTURE: Organism ID, Bacteria: NO GROWTH

## 2016-01-20 ENCOUNTER — Telehealth: Payer: Self-pay | Admitting: *Deleted

## 2016-01-20 ENCOUNTER — Other Ambulatory Visit: Payer: Self-pay | Admitting: Obstetrics and Gynecology

## 2016-01-20 DIAGNOSIS — K649 Unspecified hemorrhoids: Secondary | ICD-10-CM

## 2016-01-20 DIAGNOSIS — K5901 Slow transit constipation: Secondary | ICD-10-CM

## 2016-01-20 MED ORDER — HYDROCORTISONE ACE-PRAMOXINE 1-1 % RE FOAM: 1.0000 | Freq: Two times a day (BID) | RECTAL | 6 refills | Status: DC

## 2016-01-20 NOTE — Telephone Encounter (Signed)
Refill request from pharmacy for Proctofoam HC 1-1%, 10gm.  Please review and send refill if approved.

## 2016-01-20 NOTE — Telephone Encounter (Signed)
LM on VM making pt aware of refill.

## 2016-01-20 NOTE — Telephone Encounter (Signed)
Refill provided. Please inform patient  Thanks  Gigi Gineggy

## 2016-02-01 ENCOUNTER — Other Ambulatory Visit (HOSPITAL_COMMUNITY)
Admission: RE | Admit: 2016-02-01 | Discharge: 2016-02-01 | Disposition: A | Discharge status: Home / Self Care | Payer: Medicaid Other | Source: Ambulatory Visit | Attending: Obstetrics & Gynecology | Admitting: Obstetrics & Gynecology

## 2016-02-01 ENCOUNTER — Ambulatory Visit (INDEPENDENT_AMBULATORY_CARE_PROVIDER_SITE_OTHER): Payer: Medicaid Other | Admitting: Obstetrics & Gynecology

## 2016-02-01 VITALS — BP 102/70 | HR 93 | Wt 184.2 lb

## 2016-02-01 DIAGNOSIS — O2243 Hemorrhoids in pregnancy, third trimester: Secondary | ICD-10-CM

## 2016-02-01 DIAGNOSIS — Z113 Encounter for screening for infections with a predominantly sexual mode of transmission: Secondary | ICD-10-CM | POA: Diagnosis not present

## 2016-02-01 DIAGNOSIS — Z3493 Encounter for supervision of normal pregnancy, unspecified, third trimester: Secondary | ICD-10-CM

## 2016-02-01 DIAGNOSIS — Z23 Encounter for immunization: Secondary | ICD-10-CM | POA: Diagnosis not present

## 2016-02-01 MED ORDER — POLYETHYLENE GLYCOL 3350 17 G PO PACK: 17.0000 g | PACK | Freq: Every day | ORAL | 2 refills | Status: DC

## 2016-02-01 NOTE — Progress Notes (Signed)
Cultures, tdap today

## 2016-02-01 NOTE — Progress Notes (Signed)
   PRENATAL VISIT NOTE  Subjective:hard stool  Paula Stark is a 25 y.o. [id number] at 4421w2d being seen today for ongoing prenatal care.  She is currently monitored for the following issues for this low-risk pregnancy and has Supervision of low-risk pregnancy; Obesity affecting pregnancy in second trimester; UTI in pregnancy, antepartum; and Hemorrhoids during pregnancy in third trimester on her problem list.  Patient reports constipation.  Contractions: Not present. Vag. Bleeding: None.  Movement: Present. Denies leaking of fluid.   The following portions of the patient's history were reviewed and updated as appropriate: allergies, current medications, past family history, past medical history, past social history, past surgical history and problem list. Problem list updated.  Objective:   Vitals:   02/01/16 1112  BP: 102/70  Pulse: 93  Weight: 184 lb 3.2 oz (83.6 kg)    Fetal Status: Fetal Heart Rate (bpm): 152 Fundal Height: 36 cm Movement: Present     General:  Alert, oriented and cooperative. Patient is in no acute distress.  Skin: Skin is warm and dry. No rash noted.   Cardiovascular: Normal heart rate noted  Respiratory: Normal respiratory effort, no problems with respiration noted  Abdomen: Soft, gravid, appropriate for gestational age. Pain/Pressure: Present     Pelvic:  Cervical exam deferred        Extremities: Normal range of motion.  Edema: None  Mental Status: Normal mood and affect. Normal behavior. Normal judgment and thought content.   Assessment and Plan:  Pregnancy: [id number] at 4621w2d  1. Need for Tdap vaccination  - Tdap vaccine greater than or equal to 7yo IM  2. Encounter for supervision of low-risk pregnancy in third trimester   - Culture, beta strep (group b only) - GC/Chlamydia probe amp ([facility])not at [facility]  3. Hemorrhoids during pregnancy in third trimester Miralax - docusate sodium (COLACE) 250 MG capsule; Take 250 mg by mouth daily.  Preterm labor  symptoms and general obstetric precautions including but not limited to vaginal bleeding, contractions, leaking of fluid and fetal movement were reviewed in detail with the patient. Please refer to After Visit Summary for other counseling recommendations.  Return in about 1 week (around 02/08/2016).    Adam PhenixJames G Estalee Mccandlish, MD

## 2016-02-02 LAB — GC/CHLAMYDIA PROBE AMP (~~LOC~~) NOT AT ARMC
Chlamydia: NEGATIVE
Neisseria Gonorrhea: NEGATIVE

## 2016-02-03 LAB — CULTURE, BETA STREP (GROUP B ONLY)

## 2016-02-16 ENCOUNTER — Encounter: Payer: Self-pay | Admitting: Advanced Practice Midwife

## 2016-02-16 ENCOUNTER — Ambulatory Visit (INDEPENDENT_AMBULATORY_CARE_PROVIDER_SITE_OTHER): Payer: Medicaid Other | Admitting: Advanced Practice Midwife

## 2016-02-16 VITALS — BP 109/73 | HR 93 | Wt 185.0 lb

## 2016-02-16 DIAGNOSIS — O99212 Obesity complicating pregnancy, second trimester: Secondary | ICD-10-CM

## 2016-02-16 DIAGNOSIS — E669 Obesity, unspecified: Secondary | ICD-10-CM

## 2016-02-16 DIAGNOSIS — Z3493 Encounter for supervision of normal pregnancy, unspecified, third trimester: Secondary | ICD-10-CM

## 2016-02-16 DIAGNOSIS — O99213 Obesity complicating pregnancy, third trimester: Secondary | ICD-10-CM

## 2016-02-16 DIAGNOSIS — O2243 Hemorrhoids in pregnancy, third trimester: Secondary | ICD-10-CM

## 2016-02-16 NOTE — Patient Instructions (Signed)
Braxton Hicks Contractions °Contractions of the uterus can occur throughout pregnancy. Contractions are not always a sign that you are in labor.  °WHAT ARE BRAXTON HICKS CONTRACTIONS?  °Contractions that occur before labor are called Braxton Hicks contractions, or false labor. Toward the end of pregnancy (32-34 weeks), these contractions can develop more often and may become more forceful. This is not true labor because these contractions do not result in opening (dilatation) and thinning of the cervix. They are sometimes difficult to tell apart from true labor because these contractions can be forceful and people have different pain tolerances. You should not feel embarrassed if you go to the hospital with false labor. Sometimes, the only way to tell if you are in true labor is for your health care provider to look for changes in the cervix. °If there are no prenatal problems or other health problems associated with the pregnancy, it is completely safe to be sent home with false labor and await the onset of true labor. °HOW CAN YOU TELL THE DIFFERENCE BETWEEN TRUE AND FALSE LABOR? °False Labor  °· The contractions of false labor are usually shorter and not as hard as those of true labor.   °· The contractions are usually irregular.   °· The contractions are often felt in the front of the lower abdomen and in the groin.   °· The contractions may go away when you walk around or change positions while lying down.   °· The contractions get weaker and are shorter lasting as time goes on.   °· The contractions do not usually become progressively stronger, regular, and closer together as with true labor.   °True Labor  °· Contractions in true labor last 30-70 seconds, become very regular, usually become more intense, and increase in frequency.   °· The contractions do not go away with walking.   °· The discomfort is usually felt in the top of the uterus and spreads to the lower abdomen and low back.   °· True labor can be  determined by your health care provider with an exam. This will show that the cervix is dilating and getting thinner.   °WHAT TO REMEMBER °· Keep up with your usual exercises and follow other instructions given by your health care provider.   °· Take medicines as directed by your health care provider.   °· Keep your regular prenatal appointments.   °· Eat and drink lightly if you think you are going into labor.   °· If Braxton Hicks contractions are making you uncomfortable:   °¨ Change your position from lying down or resting to walking, or from walking to resting.   °¨ Sit and rest in a tub of warm water.   °¨ Drink 2-3 glasses of water. Dehydration may cause these contractions.   °¨ Do slow and deep breathing several times an hour.   °WHEN SHOULD I SEEK IMMEDIATE MEDICAL CARE? °Seek immediate medical care if: °· Your contractions become stronger, more regular, and closer together.   °· You have fluid leaking or gushing from your vagina.   °· You have a fever.   °· You pass blood-tinged mucus.   °· You have vaginal bleeding.   °· You have continuous abdominal pain.   °· You have low back pain that you never had before.   °· You feel your baby's head pushing down and causing pelvic pressure.   °· Your baby is not moving as much as it used to.   °This information is not intended to replace advice given to you by your health care provider. Make sure you discuss any questions you have with your health care   provider. °Document Released: 03/06/2005 Document Revised: 06/28/2015 Document Reviewed: 12/16/2012 °Elsevier Interactive Patient Education © 2017 [employer] Inc[employer] ° °

## 2016-02-16 NOTE — Progress Notes (Signed)
   PRENATAL VISIT NOTE  Subjective:  Paula Stark is a 25 y.o. [id number] at 1581w3d being seen today for ongoing prenatal care.  She is currently monitored for the following issues for this low-risk pregnancy and has Supervision of low-risk pregnancy; Obesity affecting pregnancy in second trimester; UTI in pregnancy, antepartum; and Hemorrhoids during pregnancy in third trimester on her problem list.  Patient reports no complaints.  Contractions: Not present. Vag. Bleeding: None.  Movement: Present. Denies leaking of fluid.   The following portions of the patient's history were reviewed and updated as appropriate: allergies, current medications, past family history, past medical history, past social history, past surgical history and problem list. Problem list updated.  Objective:   Vitals:   02/16/16 1558  BP: 109/73  Pulse: 93  Weight: 185 lb (83.9 kg)    Fetal Status: Fetal Heart Rate (bpm): 150   Movement: Present     General:  Alert, oriented and cooperative. Patient is in no acute distress.  Skin: Skin is warm and dry. No rash noted.   Cardiovascular: Normal heart rate noted  Respiratory: Normal respiratory effort, no problems with respiration noted  Abdomen: Soft, gravid, appropriate for gestational age. Pain/Pressure: Present     Pelvic:  Cervical exam deferred        Extremities: Normal range of motion.  Edema: None  Mental Status: Normal mood and affect. Normal behavior. Normal judgment and thought content.   Assessment and Plan:  Pregnancy: [id number] at 5181w3d  1. Encounter for supervision of low-risk pregnancy in third trimester    Discussed signs of labor  2. Obesity affecting pregnancy in second trimester   3. Hemorrhoids during pregnancy in third trimester      Patient is concerned about hemorrhoid popping out during pushing stage      Wants the delivering clinician to hold pressure on rectum during second stage  Term labor symptoms and general obstetric precautions  including but not limited to vaginal bleeding, contractions, leaking of fluid and fetal movement were reviewed in detail with the patient. Please refer to After Visit Summary for other counseling recommendations.  Return in 1 week (on 02/23/2016).   Aviva SignsMarie L Sheamus Hasting, CNM

## 2016-02-23 ENCOUNTER — Encounter: Payer: Self-pay | Admitting: Advanced Practice Midwife

## 2016-02-23 ENCOUNTER — Ambulatory Visit (INDEPENDENT_AMBULATORY_CARE_PROVIDER_SITE_OTHER): Payer: Medicaid Other | Admitting: Advanced Practice Midwife

## 2016-02-23 VITALS — BP 108/74 | HR 86 | Wt 187.1 lb

## 2016-02-23 DIAGNOSIS — Z3493 Encounter for supervision of normal pregnancy, unspecified, third trimester: Secondary | ICD-10-CM

## 2016-02-23 NOTE — Patient Instructions (Signed)
Braxton Hicks Contractions °Contractions of the uterus can occur throughout pregnancy. Contractions are not always a sign that you are in labor.  °WHAT ARE BRAXTON HICKS CONTRACTIONS?  °Contractions that occur before labor are called Braxton Hicks contractions, or false labor. Toward the end of pregnancy (32-34 weeks), these contractions can develop more often and may become more forceful. This is not true labor because these contractions do not result in opening (dilatation) and thinning of the cervix. They are sometimes difficult to tell apart from true labor because these contractions can be forceful and people have different pain tolerances. You should not feel embarrassed if you go to the hospital with false labor. Sometimes, the only way to tell if you are in true labor is for your health care provider to look for changes in the cervix. °If there are no prenatal problems or other health problems associated with the pregnancy, it is completely safe to be sent home with false labor and await the onset of true labor. °HOW CAN YOU TELL THE DIFFERENCE BETWEEN TRUE AND FALSE LABOR? °False Labor  °· The contractions of false labor are usually shorter and not as hard as those of true labor.   °· The contractions are usually irregular.   °· The contractions are often felt in the front of the lower abdomen and in the groin.   °· The contractions may go away when you walk around or change positions while lying down.   °· The contractions get weaker and are shorter lasting as time goes on.   °· The contractions do not usually become progressively stronger, regular, and closer together as with true labor.   °True Labor  °· Contractions in true labor last 30-70 seconds, become very regular, usually become more intense, and increase in frequency.   °· The contractions do not go away with walking.   °· The discomfort is usually felt in the top of the uterus and spreads to the lower abdomen and low back.   °· True labor can be  determined by your health care provider with an exam. This will show that the cervix is dilating and getting thinner.   °WHAT TO REMEMBER °· Keep up with your usual exercises and follow other instructions given by your health care provider.   °· Take medicines as directed by your health care provider.   °· Keep your regular prenatal appointments.   °· Eat and drink lightly if you think you are going into labor.   °· If Braxton Hicks contractions are making you uncomfortable:   °¨ Change your position from lying down or resting to walking, or from walking to resting.   °¨ Sit and rest in a tub of warm water.   °¨ Drink 2-3 glasses of water. Dehydration may cause these contractions.   °¨ Do slow and deep breathing several times an hour.   °WHEN SHOULD I SEEK IMMEDIATE MEDICAL CARE? °Seek immediate medical care if: °· Your contractions become stronger, more regular, and closer together.   °· You have fluid leaking or gushing from your vagina.   °· You have a fever.   °· You pass blood-tinged mucus.   °· You have vaginal bleeding.   °· You have continuous abdominal pain.   °· You have low back pain that you never had before.   °· You feel your baby's head pushing down and causing pelvic pressure.   °· Your baby is not moving as much as it used to.   °This information is not intended to replace advice given to you by your health care provider. Make sure you discuss any questions you have with your health care   provider. °Document Released: 03/06/2005 Document Revised: 06/28/2015 Document Reviewed: 12/16/2012 °Elsevier Interactive Patient Education © 2017 [employer] Inc[employer] ° °

## 2016-02-23 NOTE — Progress Notes (Signed)
   PRENATAL VISIT NOTE  Subjective:  Paula Stark is a 25 y.o. [id number] at 6566w3d being seen today for ongoing prenatal care.  She is currently monitored for the following issues for this low-risk pregnancy and has Supervision of low-risk pregnancy; Obesity affecting pregnancy in second trimester; UTI in pregnancy, antepartum; and Hemorrhoids during pregnancy in third trimester on her problem list.  Patient reports no complaints.  Contractions: Not present. Vag. Bleeding: None.  Movement: Present. Denies leaking of fluid.   The following portions of the patient's history were reviewed and updated as appropriate: allergies, current medications, past family history, past medical history, past social history, past surgical history and problem list. Problem list updated.  Objective:   Vitals:   02/23/16 0933  BP: 108/74  Pulse: 86  Weight: 187 lb 1.6 oz (84.9 kg)    Fetal Status: Fetal Heart Rate (bpm): 160   Movement: Present     General:  Alert, oriented and cooperative. Patient is in no acute distress.  Skin: Skin is warm and dry. No rash noted.   Cardiovascular: Normal heart rate noted  Respiratory: Normal respiratory effort, no problems with respiration noted  Abdomen: Soft, gravid, appropriate for gestational age. Pain/Pressure: Present     Pelvic:  Cervical exam deferred        Extremities: Normal range of motion.  Edema: None  Mental Status: Normal mood and affect. Normal behavior. Normal judgment and thought content.   Assessment and Plan:  Pregnancy: [id number] at 6466w3d  1. Encounter for supervision of low-risk pregnancy in third trimester     Labor signs reviewed  Term labor symptoms and general obstetric precautions including but not limited to vaginal bleeding, contractions, leaking of fluid and fetal movement were reviewed in detail with the patient. Please refer to After Visit Summary for other counseling recommendations.  Return in about 1 week (around 03/01/2016) for Low  Risk Clinic.   Paula Stark, CNM

## 2016-02-29 ENCOUNTER — Encounter (HOSPITAL_COMMUNITY): Payer: Self-pay | Admitting: *Deleted

## 2016-02-29 ENCOUNTER — Inpatient Hospital Stay (HOSPITAL_COMMUNITY)
Admission: AD | Admit: 2016-02-29 | Discharge: 2016-03-02 | DRG: 775 | Disposition: A | Discharge status: Home / Self Care | Payer: Medicaid Other | Source: Ambulatory Visit | Attending: Obstetrics and Gynecology | Admitting: Obstetrics and Gynecology

## 2016-02-29 ENCOUNTER — Inpatient Hospital Stay (HOSPITAL_COMMUNITY): Payer: Medicaid Other | Admitting: Anesthesiology

## 2016-02-29 ENCOUNTER — Ambulatory Visit (INDEPENDENT_AMBULATORY_CARE_PROVIDER_SITE_OTHER): Payer: Medicaid Other | Admitting: Advanced Practice Midwife

## 2016-02-29 DIAGNOSIS — O2243 Hemorrhoids in pregnancy, third trimester: Secondary | ICD-10-CM | POA: Diagnosis present

## 2016-02-29 DIAGNOSIS — Z3493 Encounter for supervision of normal pregnancy, unspecified, third trimester: Secondary | ICD-10-CM | POA: Diagnosis present

## 2016-02-29 DIAGNOSIS — O2340 Unspecified infection of urinary tract in pregnancy, unspecified trimester: Secondary | ICD-10-CM

## 2016-02-29 DIAGNOSIS — Z3A39 39 weeks gestation of pregnancy: Secondary | ICD-10-CM | POA: Diagnosis not present

## 2016-02-29 DIAGNOSIS — Z3483 Encounter for supervision of other normal pregnancy, third trimester: Secondary | ICD-10-CM

## 2016-02-29 DIAGNOSIS — O99212 Obesity complicating pregnancy, second trimester: Secondary | ICD-10-CM

## 2016-02-29 LAB — TYPE AND SCREEN
ABO/RH(D): O POS
Antibody Screen: NEGATIVE

## 2016-02-29 LAB — CBC
HCT: 37.7 % (ref 36.0–46.0)
Hemoglobin: 13 g/dL (ref 12.0–15.0)
MCH: 28.4 pg (ref 26.0–34.0)
MCHC: 34.5 g/dL (ref 30.0–36.0)
MCV: 82.5 fL (ref 78.0–100.0)
Platelets: 216 10*3/uL (ref 150–400)
RBC: 4.57 MIL/uL (ref 3.87–5.11)
RDW: 14.1 % (ref 11.5–15.5)
WBC: 11.6 10*3/uL — ABNORMAL HIGH (ref 4.0–10.5)

## 2016-02-29 MED ORDER — ONDANSETRON HCL 4 MG/2ML IJ SOLN: 4.0000 mg | Freq: Four times a day (QID) | INTRAMUSCULAR | Status: DC | PRN

## 2016-02-29 MED ORDER — OXYTOCIN 40 UNITS IN LACTATED RINGERS INFUSION - SIMPLE MED
2.5000 [IU]/h | INTRAVENOUS | Status: DC
  Administered 2016-02-29: 2.5 [IU]/h via INTRAVENOUS
  Filled 2016-02-29: qty 1000

## 2016-02-29 MED ORDER — PRENATAL MULTIVITAMIN CH
1.0000 | ORAL_TABLET | Freq: Every day | ORAL | Status: DC
  Administered 2016-03-01 – 2016-03-02 (×2): 1 via ORAL
  Filled 2016-02-29 (×2): qty 1

## 2016-02-29 MED ORDER — POLYETHYLENE GLYCOL 3350 17 G PO PACK
17.0000 g | PACK | Freq: Every day | ORAL | Status: DC
Start: 2016-02-29 — End: 2016-03-02
  Administered 2016-02-29 – 2016-03-02 (×3): 17 g via ORAL
  Filled 2016-02-29 (×4): qty 1

## 2016-02-29 MED ORDER — BENZOCAINE-MENTHOL 20-0.5 % EX AERO
1.0000 "application " | INHALATION_SPRAY | CUTANEOUS | Status: DC | PRN
  Administered 2016-02-29: 1 via TOPICAL
  Filled 2016-02-29: qty 56

## 2016-02-29 MED ORDER — DIPHENHYDRAMINE HCL 25 MG PO CAPS: 25.0000 mg | ORAL_CAPSULE | Freq: Four times a day (QID) | ORAL | Status: DC | PRN

## 2016-02-29 MED ORDER — PHENYLEPHRINE 40 MCG/ML (10ML) SYRINGE FOR IV PUSH (FOR BLOOD PRESSURE SUPPORT)
80.0000 ug | PREFILLED_SYRINGE | INTRAVENOUS | Status: DC | PRN
  Filled 2016-02-29: qty 5
  Filled 2016-02-29: qty 10

## 2016-02-29 MED ORDER — EPHEDRINE 5 MG/ML INJ
10.0000 mg | INTRAVENOUS | Status: DC | PRN
  Filled 2016-02-29: qty 4

## 2016-02-29 MED ORDER — DIBUCAINE 1 % RE OINT: 1.0000 "application " | TOPICAL_OINTMENT | RECTAL | Status: DC | PRN

## 2016-02-29 MED ORDER — OXYCODONE-ACETAMINOPHEN 5-325 MG PO TABS: 2.0000 | ORAL_TABLET | ORAL | Status: DC | PRN

## 2016-02-29 MED ORDER — ACETAMINOPHEN 325 MG PO TABS: 650.0000 mg | ORAL_TABLET | ORAL | Status: DC | PRN

## 2016-02-29 MED ORDER — SOD CITRATE-CITRIC ACID 500-334 MG/5ML PO SOLN: 30.0000 mL | ORAL | Status: DC | PRN

## 2016-02-29 MED ORDER — PSYLLIUM 95 % PO PACK
1.0000 | PACK | Freq: Every day | ORAL | Status: DC
  Administered 2016-03-01 – 2016-03-02 (×2): 1 via ORAL
  Filled 2016-02-29 (×4): qty 1

## 2016-02-29 MED ORDER — ACETAMINOPHEN 325 MG PO TABS
650.0000 mg | ORAL_TABLET | ORAL | Status: DC | PRN
  Administered 2016-03-01 (×2): 650 mg via ORAL
  Filled 2016-02-29 (×2): qty 2

## 2016-02-29 MED ORDER — LIDOCAINE HCL (PF) 1 % IJ SOLN
INTRAMUSCULAR | Status: DC | PRN
  Administered 2016-02-29: 4 mL
  Administered 2016-02-29: 6 mL via EPIDURAL

## 2016-02-29 MED ORDER — WITCH HAZEL-GLYCERIN EX PADS
1.0000 "application " | MEDICATED_PAD | CUTANEOUS | Status: DC | PRN
  Administered 2016-03-01: 1 via TOPICAL

## 2016-02-29 MED ORDER — IBUPROFEN 600 MG PO TABS
600.0000 mg | ORAL_TABLET | Freq: Four times a day (QID) | ORAL | Status: DC
  Administered 2016-02-29 – 2016-03-02 (×7): 600 mg via ORAL
  Filled 2016-02-29 (×7): qty 1

## 2016-02-29 MED ORDER — LIDOCAINE HCL (PF) 1 % IJ SOLN
30.0000 mL | INTRAMUSCULAR | Status: DC | PRN
  Filled 2016-02-29: qty 30

## 2016-02-29 MED ORDER — ONDANSETRON HCL 4 MG PO TABS: 4.0000 mg | ORAL_TABLET | ORAL | Status: DC | PRN

## 2016-02-29 MED ORDER — LACTATED RINGERS IV SOLN
500.0000 mL | Freq: Once | INTRAVENOUS | Status: AC
  Administered 2016-02-29: 1000 mL via INTRAVENOUS

## 2016-02-29 MED ORDER — FENTANYL CITRATE (PF) 100 MCG/2ML IJ SOLN
50.0000 ug | INTRAMUSCULAR | Status: DC | PRN
  Administered 2016-02-29: 100 ug via INTRAVENOUS
  Filled 2016-02-29: qty 2

## 2016-02-29 MED ORDER — ONDANSETRON HCL 4 MG/2ML IJ SOLN: 4.0000 mg | INTRAMUSCULAR | Status: DC | PRN

## 2016-02-29 MED ORDER — TETANUS-DIPHTH-ACELL PERTUSSIS 5-2.5-18.5 LF-MCG/0.5 IM SUSP: 0.5000 mL | Freq: Once | INTRAMUSCULAR | Status: DC

## 2016-02-29 MED ORDER — SENNOSIDES-DOCUSATE SODIUM 8.6-50 MG PO TABS
2.0000 | ORAL_TABLET | ORAL | Status: DC
  Administered 2016-03-01 (×2): 2 via ORAL
  Filled 2016-02-29 (×2): qty 2

## 2016-02-29 MED ORDER — SIMETHICONE 80 MG PO CHEW: 80.0000 mg | CHEWABLE_TABLET | ORAL | Status: DC | PRN

## 2016-02-29 MED ORDER — ZOLPIDEM TARTRATE 5 MG PO TABS: 5.0000 mg | ORAL_TABLET | Freq: Every evening | ORAL | Status: DC | PRN

## 2016-02-29 MED ORDER — OXYCODONE-ACETAMINOPHEN 5-325 MG PO TABS: 1.0000 | ORAL_TABLET | ORAL | Status: DC | PRN

## 2016-02-29 MED ORDER — LACTATED RINGERS IV SOLN: 500.0000 mL | INTRAVENOUS | Status: DC | PRN

## 2016-02-29 MED ORDER — FENTANYL 2.5 MCG/ML BUPIVACAINE 1/10 % EPIDURAL INFUSION (WH - ANES)
14.0000 mL/h | INTRAMUSCULAR | Status: DC | PRN
  Administered 2016-02-29: 14 mL/h via EPIDURAL
  Filled 2016-02-29: qty 100

## 2016-02-29 MED ORDER — COCONUT OIL OIL: 1.0000 "application " | TOPICAL_OIL | Status: DC | PRN

## 2016-02-29 MED ORDER — PHENYLEPHRINE 40 MCG/ML (10ML) SYRINGE FOR IV PUSH (FOR BLOOD PRESSURE SUPPORT)
80.0000 ug | PREFILLED_SYRINGE | INTRAVENOUS | Status: DC | PRN
  Filled 2016-02-29: qty 5

## 2016-02-29 MED ORDER — HYDROCORTISONE ACE-PRAMOXINE 1-1 % RE FOAM
1.0000 | Freq: Two times a day (BID) | RECTAL | Status: DC
  Administered 2016-02-29 – 2016-03-01 (×2): 1 via RECTAL
  Filled 2016-02-29: qty 10

## 2016-02-29 MED ORDER — LACTATED RINGERS IV SOLN
INTRAVENOUS | Status: DC
  Administered 2016-02-29 (×2): via INTRAVENOUS

## 2016-02-29 MED ORDER — OXYTOCIN BOLUS FROM INFUSION
500.0000 mL | Freq: Once | INTRAVENOUS | Status: AC
  Administered 2016-02-29: 500 mL via INTRAVENOUS

## 2016-02-29 MED ORDER — DIPHENHYDRAMINE HCL 50 MG/ML IJ SOLN: 12.5000 mg | INTRAMUSCULAR | Status: DC | PRN

## 2016-02-29 NOTE — Patient Instructions (Signed)
Braxton Hicks Contractions °Contractions of the uterus can occur throughout pregnancy. Contractions are not always a sign that you are in labor.  °WHAT ARE BRAXTON HICKS CONTRACTIONS?  °Contractions that occur before labor are called Braxton Hicks contractions, or false labor. Toward the end of pregnancy (32-34 weeks), these contractions can develop more often and may become more forceful. This is not true labor because these contractions do not result in opening (dilatation) and thinning of the cervix. They are sometimes difficult to tell apart from true labor because these contractions can be forceful and people have different pain tolerances. You should not feel embarrassed if you go to the hospital with false labor. Sometimes, the only way to tell if you are in true labor is for your health care provider to look for changes in the cervix. °If there are no prenatal problems or other health problems associated with the pregnancy, it is completely safe to be sent home with false labor and await the onset of true labor. °HOW CAN YOU TELL THE DIFFERENCE BETWEEN TRUE AND FALSE LABOR? °False Labor  °· The contractions of false labor are usually shorter and not as hard as those of true labor.   °· The contractions are usually irregular.   °· The contractions are often felt in the front of the lower abdomen and in the groin.   °· The contractions may go away when you walk around or change positions while lying down.   °· The contractions get weaker and are shorter lasting as time goes on.   °· The contractions do not usually become progressively stronger, regular, and closer together as with true labor.   °True Labor  °· Contractions in true labor last 30-70 seconds, become very regular, usually become more intense, and increase in frequency.   °· The contractions do not go away with walking.   °· The discomfort is usually felt in the top of the uterus and spreads to the lower abdomen and low back.   °· True labor can be  determined by your health care provider with an exam. This will show that the cervix is dilating and getting thinner.   °WHAT TO REMEMBER °· Keep up with your usual exercises and follow other instructions given by your health care provider.   °· Take medicines as directed by your health care provider.   °· Keep your regular prenatal appointments.   °· Eat and drink lightly if you think you are going into labor.   °· If Braxton Hicks contractions are making you uncomfortable:   °¨ Change your position from lying down or resting to walking, or from walking to resting.   °¨ Sit and rest in a tub of warm water.   °¨ Drink 2-3 glasses of water. Dehydration may cause these contractions.   °¨ Do slow and deep breathing several times an hour.   °WHEN SHOULD I SEEK IMMEDIATE MEDICAL CARE? °Seek immediate medical care if: °· Your contractions become stronger, more regular, and closer together.   °· You have fluid leaking or gushing from your vagina.   °· You have a fever.   °· You pass blood-tinged mucus.   °· You have vaginal bleeding.   °· You have continuous abdominal pain.   °· You have low back pain that you never had before.   °· You feel your baby's head pushing down and causing pelvic pressure.   °· Your baby is not moving as much as it used to.   °This information is not intended to replace advice given to you by your health care provider. Make sure you discuss any questions you have with your health care   provider. °Document Released: 03/06/2005 Document Revised: 06/28/2015 Document Reviewed: 12/16/2012 °Elsevier Interactive Patient Education © 2017 [employer] Inc[employer] ° °

## 2016-02-29 NOTE — Anesthesia Preprocedure Evaluation (Signed)

## 2016-02-29 NOTE — MAU Note (Signed)
Was seen in the clinic, was 6cm. For direct admit, but BS can not receive patient at this time.

## 2016-02-29 NOTE — MAU Note (Signed)
Sent from clinic, 6cm. For direct adm to L&D, they are unable to take her.

## 2016-02-29 NOTE — Progress Notes (Signed)
   PRENATAL VISIT NOTE  Subjective:  Paula Stark is a 25 y.o. [id number] at 1838w2d being seen today for ongoing prenatal care.  She is currently monitored for the following issues for this low-risk pregnancy and has Supervision of low-risk pregnancy; Obesity affecting pregnancy in second trimester; UTI in pregnancy, antepartum; and Hemorrhoids during pregnancy in third trimester on her problem list.  Patient reports contractions since this morning.  Contractions: Not present. Vag. Bleeding: None.  Movement: Present. Denies leaking of fluid.   The following portions of the patient's history were reviewed and updated as appropriate: allergies, current medications, past family history, past medical history, past social history, past surgical history and problem list. Problem list updated.  Objective:   Vitals:   02/29/16 0936  BP: 122/80  Pulse: 83  Weight: 189 lb (85.7 kg)    Fetal Status: Fetal Heart Rate (bpm): 154   Movement: Present     General:  Alert, oriented and cooperative. Patient is in no acute distress.  Skin: Skin is warm and dry. No rash noted.   Cardiovascular: Normal heart rate noted  Respiratory: Normal respiratory effort, no problems with respiration noted  Abdomen: Soft, gravid, appropriate for gestational age. Pain/Pressure: Present     Pelvic:  Cervical exam performed         Cervix 5-6/100/-1/BBOW  Extremities: Normal range of motion.  Edema: None  Mental Status: Normal mood and affect. Normal behavior. Normal judgment and thought content.   Assessment and Plan:  Pregnancy: [id number] at 6438w2d  There are no diagnoses linked to this encounter. Term labor symptoms and general obstetric precautions including but not limited to vaginal bleeding, contractions, leaking of fluid and fetal movement were reviewed in detail with the patient. Please refer to After Visit Summary for other counseling recommendations.   [facility] to [facility]  Aviva SignsMarie L Miciah Covelli, CNM

## 2016-02-29 NOTE — Progress Notes (Signed)
S: Patient seen & examined for progress of labor. Patient uncomfortable and requesting epidural.    O:  Vitals:   02/29/16 1027 02/29/16 1038 02/29/16 1131 02/29/16 1200  BP:  122/78 105/66   Pulse:  88 82   Resp:  18 18 20   Temp:  97.8 F (36.6 C) 98 F (36.7 C)   TempSrc:   Oral   Weight: 187 lb (84.8 kg)     Height: 5\' 2"  (1.575 m)           FHT: Cat I tracing TOCO: q705min   A/P: Will give epidural for pain control Continue expectant management Anticipate SVD  Deforest HoylesNoah Arinze Rivadeneira, DO PGY-3

## 2016-02-29 NOTE — H&P (Signed)
LABOR AND DELIVERY ADMISSION HISTORY AND PHYSICAL NOTE  Paula Stark is a 25 y.o. female [phone] with IUP at 464w2d by LMP c/w 19wkUS presenting for SOL.   She reports positive fetal movement. She denies leakage of fluid or vaginal bleeding.  Prenatal History/Complications:  Past Medical History: Past Medical History:  Diagnosis Date  . Eczema   . Postpartum hemorrhage     Past Surgical History: Past Surgical History:  Procedure Laterality Date  . DILATION AND EVACUATION N/A 04/20/2015   Procedure: DILATATION AND EVACUATION;  Surgeon: Adam PhenixJames G Arnold, MD;  Location: [facility];  Service: Gynecology;  Laterality: N/A;    Obstetrical History: OB History    Gravida Para Term Preterm AB Living   4 2 2  0 1 2   SAB TAB Ectopic Multiple Live Births   1 0 0 0 2      Social History: Social History   Social History  . Marital status: Married    Spouse name: N/A  . Number of children: N/A  . Years of education: N/A   Social History Main Topics  . Smoking status: Never Smoker  . Smokeless tobacco: Never Used  . Alcohol use No  . Drug use: No  . Sexual activity: Not Currently   Other Topics Concern  . Not on file   Social History Narrative  . No narrative on file    Family History: No family history on file.  Allergies: No Known Allergies  Prescriptions Prior to Admission  Medication Sig Dispense Refill Last Dose  . docusate sodium (COLACE) 250 MG capsule Take 250 mg by mouth daily.   Taking  . hydrocortisone-pramoxine (PROCTOFOAM-HC) rectal foam Place 1 applicator rectally 2 (two) times daily. 10 g 6 Taking  . polyethylene glycol (MIRALAX) packet Take 17 g by mouth daily. 14 each 2 Taking  . Prenatal Multivit-Min-Fe-FA (PRENATAL VITAMINS) 0.8 MG tablet Take 1 tablet by mouth daily. 30 tablet 12 Taking  . Prenatal Vit-Fe Fumarate-FA (PREPLUS) 27-1 MG TABS TK 1 T PO D  12 Taking  . psyllium (METAMUCIL SMOOTH TEXTURE) 28 % packet Take 1 packet by mouth daily. 30 packet 3  Taking     Review of Systems   All systems reviewed and negative except as stated in HPI  Last menstrual period 05/30/2015. General appearance: alert, cooperative and appears stated age Lungs: clear to auscultation bilaterally Heart: regular rate and rhythm Abdomen: soft, non-tender; bowel sounds normal Extremities: No calf swelling or tenderness Presentation: cephalic Fetal monitoring: Cat I tracing Uterine activity:      Prenatal labs: ABO, Rh: O/POS/-- (07/20 1310) Antibody: NEG (07/20 1310) Rubella: !Error! RPR: NON REAC (09/20 1123)  HBsAg: NEGATIVE (07/20 1310)  HIV: NONREACTIVE (09/20 1123)  GBS:    1 hr Glucola: 119 3rd trimester:145 Genetic screening:  Quad negative Anatomy US: normal  Prenatal Transfer Tool  Maternal Diabetes: No Genetic Screening: Normal Maternal Ultrasounds/Referrals: Normal Fetal Ultrasounds or other Referrals:  None Maternal Substance Abuse:  No Significant Maternal Medications:  None Significant Maternal Lab Results: None  No results found for this or any previous visit (from the past 24 hour(s)).  Patient Active Problem List   Diagnosis Date Noted  . Hemorrhoids during pregnancy in third trimester 12/28/2015  . UTI in pregnancy, antepartum 10/10/2015  . Supervision of low-risk pregnancy 10/07/2015  . Obesity affecting pregnancy in second trimester 10/07/2015    Assessment: Paula NipMah Leiterman is a 25 y.o. [id number] at 6564w2d here for SOL  #Labor:Anticipate SVD. #Pain: IV  Pain meds prn/Epidural on request #FWB: Cat I tracing #ID:  GBS negative #MOF: Bottle #MOC:Nexplanon #Circ:  declined  WALLACE, NOAH I, DO PGY-3 02/29/2016, 10:16 AM

## 2016-02-29 NOTE — Anesthesia Procedure Notes (Signed)
Epidural Patient location during procedure: OB Start time: 02/29/2016 1:05 PM End time: 02/29/2016 1:16 PM  Preanesthetic Checklist Completed: patient identified, site marked, surgical consent, pre-op evaluation, timeout performed, IV checked, risks and benefits discussed and monitors and equipment checked  Epidural Patient position: sitting Prep: site prepped and draped and DuraPrep Patient monitoring: continuous pulse ox and blood pressure Approach: midline Location: L3-L4 Injection technique: LOR air  Needle:  Needle type: Tuohy  Needle gauge: 17 G Needle length: 9 cm and 9 Needle insertion depth: 5 cm cm Catheter type: closed end flexible Catheter size: 19 Gauge Catheter at skin depth: 10 cm Test dose: negative  Assessment Events: blood not aspirated, injection not painful, no injection resistance, negative IV test and no paresthesia  Additional Notes Dosing of Epidural:  1st dose, through catheter ............................................Marland Kitchen.  Xylocaine 40 mg  2nd dose, through catheter, after waiting 3 minutes........Marland Kitchen.Xylocaine 60 mg    As each dose occurred, patient was free of IV sx; and patient exhibited no evidence of SA injection.  Patient is more comfortable after epidural dosed. Please see RN's note for documentation of vital signs,and FHR which are stable.  Patient reminded not to try to ambulate with numb legs, and that an RN must be present when she attempts to get up.

## 2016-02-29 NOTE — Progress Notes (Signed)
S: Patient seen & examined for progress of labor. Patient comfortable with epidural.    O:  Vitals:   02/29/16 1400 02/29/16 1431 02/29/16 1501 02/29/16 1530  BP: 108/73 119/74 111/68 104/72  Pulse: 76 81 79 88  Resp: 20 20 20  (!) 22  Temp: 97.9 F (36.6 C)   98.9 F (37.2 C)  TempSrc: Oral   Axillary  SpO2:      Weight:      Height:        Dilation: 10 Dilation Complete Date: 02/29/16 Dilation Complete Time: 1231 Effacement (%): 100 Station: +1 Presentation: Vertex Exam by:: Dr. Earlene PlaterWallace   FHT: Cat I tracing TOCO: q723min   A/P: Continue expectant management Anticipate SVD  Deforest HoylesNoah Chanel Mckesson, DO PGY-3

## 2016-03-01 LAB — RPR: RPR Ser Ql: NONREACTIVE

## 2016-03-01 MED ORDER — HYDROCORTISONE ACE-PRAMOXINE 1-1 % RE FOAM: 1.0000 | Freq: Three times a day (TID) | RECTAL | Status: DC | PRN

## 2016-03-01 MED ORDER — PRAMOXINE HCL 1 % RE FOAM: Freq: Three times a day (TID) | RECTAL | Status: DC | PRN

## 2016-03-01 NOTE — Anesthesia Postprocedure Evaluation (Signed)
Anesthesia Post Note  Patient: Paula Stark  Procedure(s) Performed: * No procedures listed *  Patient location during evaluation: Mother Baby Anesthesia Type: Epidural Level of consciousness: awake and alert Pain management: satisfactory to patient Vital Signs Assessment: post-procedure vital signs reviewed and stable Respiratory status: respiratory function stable Cardiovascular status: stable Postop Assessment: no headache, no backache, epidural receding, patient able to bend at knees, no signs of nausea or vomiting and adequate PO intake Anesthetic complications: no     Last Vitals:  Vitals:   03/01/16 0100 03/01/16 0551  BP: 110/72 99/60  Pulse: 82 75  Resp: 18 18  Temp: 36.9 C 37 C    Last Pain:  Vitals:   03/01/16 0637  TempSrc:   PainSc: 4    Pain Goal: Patients Stated Pain Goal: 2 (03/01/16 [id number])               Karleen DolphinFUSSELL,Isaish Alemu

## 2016-03-01 NOTE — Progress Notes (Signed)
UR chart review completed.  

## 2016-03-01 NOTE — Progress Notes (Addendum)
Post Partum Day 1 Subjective: up ad lib, voiding, tolerating PO, + flatus and C/O hemorrhoid pain  Objective: Blood pressure 99/60, pulse 75, temperature 98.6 F (37 C), temperature source Oral, resp. rate 18, height 5\' 2"  (1.575 m), weight 187 lb (84.8 kg), last menstrual period 05/30/2015, SpO2 100 %, unknown if currently breastfeeding.  Physical Exam:  General: alert, cooperative and no distress Lochia: appropriate Uterine Fundus: firm Incision: healing well DVT Evaluation: No evidence of DVT seen on physical exam.   Recent Labs  02/29/16 1048  HGB 13.0  HCT 37.7    Assessment/Plan: Plan for discharge tomorrow and Breastfeeding Plans Nexplanon   LOS: 1 day   [facility] HospitalWILLIAMS,MARIE 03/01/2016, 9:41 AM

## 2016-03-02 ENCOUNTER — Other Ambulatory Visit: Payer: Self-pay | Admitting: Obstetrics & Gynecology

## 2016-03-02 MED ORDER — LIDOCAINE 5 % EX OINT: 1.0000 "application " | TOPICAL_OINTMENT | CUTANEOUS | 0 refills | Status: DC | PRN

## 2016-03-02 MED ORDER — SENNOSIDES-DOCUSATE SODIUM 8.6-50 MG PO TABS: 2.0000 | ORAL_TABLET | ORAL | 2 refills | Status: DC

## 2016-03-02 MED ORDER — IBUPROFEN 600 MG PO TABS: 600.0000 mg | ORAL_TABLET | Freq: Four times a day (QID) | ORAL | 0 refills | Status: DC

## 2016-03-02 NOTE — Discharge Instructions (Signed)

## 2016-03-02 NOTE — Discharge Summary (Signed)
OB Discharge Summary     Patient Name: Paula Stark DOB: 07/10/1990 MRN: [id number]  Date of admission: 02/29/2016 Delivering MD: Paula CootsYSON, ANDREW   Date of discharge: 03/02/2016  Admitting diagnosis: LABOR Intrauterine pregnancy: 8077w2d     Secondary diagnosis:  Active Problems:   Normal labor  Additional problems: hemorrhoids during pregnancy in third trimester     Discharge diagnosis: Term Pregnancy Delivered                                                                                                Post partum procedures:none  Augmentation: none  Complications: None  Hospital course:  Onset of Labor With Vaginal Delivery     25 y.o. yo [phone] at 4477w2d was admitted in Active Labor on 02/29/2016. Patient had an uncomplicated labor course as follows:  Membrane Rupture Time/Date: 2:19 PM ,02/29/2016   Intrapartum Procedures: Episiotomy: None [1]                                         Lacerations:  None [1]  Patient had a delivery of a Viable infant. 02/29/2016  Information for the patient's newborn:  Paula Stark, Boy Paula Stark [[id number]][[id number]]  Delivery Method: Vaginal, Spontaneous Delivery (Filed from Delivery Summary)    Pateint had an uncomplicated postpartum course.  She is ambulating, tolerating a regular diet, passing flatus, and urinating well. Patient is discharged home in stable condition on 03/02/16.    Physical exam  Vitals:   03/01/16 0551 03/01/16 1000 03/01/16 1930 03/02/16 0539  BP: 99/60 114/71 104/64 (!) 113/58  Pulse: 75 83 83 72  Resp: 18 16 18 18   Temp: 98.6 F (37 C) 97.4 F (36.3 C) 97.5 F (36.4 C) 98 F (36.7 C)  TempSrc: Oral Oral Oral Oral  SpO2:      Weight:      Height:       General: alert, cooperative and no distress Lochia: appropriate Uterine Fundus: firm DVT Evaluation: No evidence of DVT seen on physical exam.  Labs: Lab Results  Component Value Date   WBC 11.6 (H) 02/29/2016   HGB 13.0 02/29/2016   HCT 37.7 02/29/2016   MCV 82.5  02/29/2016   PLT 216 02/29/2016   CMP Latest Ref Rng & Units 12/14/2015  Glucose 65 - 104 mg/dL 80  BUN 6 - 23 mg/dL -  Creatinine 1.300.50 - 8.651.10 mg/dL -  Sodium 784135 - 696145 mEq/L -  Potassium 3.5 - 5.1 mEq/L -  Chloride 96 - 112 mEq/L -  CO2 19 - 32 mEq/L -  Calcium 8.4 - 10.5 mg/dL -  Total Protein 6.0 - 8.3 g/dL -  Total Bilirubin 0.3 - 1.2 mg/dL -  Alkaline Phos 39 - 295117 U/L -  AST 0 - 37 U/L -  ALT 0 - 35 U/L -    Discharge instruction: per After Visit Summary and "Baby and Me Booklet".  After visit meds:    Medication List    TAKE these medications   hydrocortisone-pramoxine  rectal foam Commonly known as:  PROCTOFOAM-HC Place 1 applicator rectally 2 (two) times daily.   lidocaine 5 % ointment Commonly known as:  XYLOCAINE Apply 1 application topically as needed.   polyethylene glycol packet Commonly known as:  MIRALAX Take 17 g by mouth daily.   Prenatal Vitamins 0.8 MG tablet Take 1 tablet by mouth daily.   psyllium 28 % packet Commonly known as:  METAMUCIL SMOOTH TEXTURE Take 1 packet by mouth daily.           Diet: routine diet  Activity: Advance as tolerated. Pelvic rest for 6 weeks.   Outpatient follow up:6 weeks Follow up Appt: Future Appointments Date Time Provider Department Center  04/11/2016 10:00 AM [facility] [place] [facility]   Follow up Visit:No Follow-up on file.  Postpartum contraception: Nexplanon  Newborn Data: Live born female  Birth Weight: 8 lb 11.7 oz (3960 g) APGAR: 8, 9  Baby Feeding: Bottle Disposition:home with mother   03/02/2016 Paula CootsAndrew Tyson, MD  OB FELLOW DISCHARGE ATTESTATION  I have seen and examined this patient and agree with above documentation in the resident's note.   Paula PennaNicholas Schenk, MD 11:41 AM

## 2016-03-14 ENCOUNTER — Other Ambulatory Visit: Payer: Self-pay | Admitting: Obstetrics & Gynecology

## 2016-03-21 ENCOUNTER — Other Ambulatory Visit: Payer: Self-pay | Admitting: Obstetrics and Gynecology

## 2016-03-21 ENCOUNTER — Other Ambulatory Visit: Payer: Self-pay | Admitting: Obstetrics & Gynecology

## 2016-03-22 ENCOUNTER — Other Ambulatory Visit: Payer: Self-pay | Admitting: *Deleted

## 2016-03-22 DIAGNOSIS — O039 Complete or unspecified spontaneous abortion without complication: Secondary | ICD-10-CM

## 2016-03-23 ENCOUNTER — Telehealth: Payer: Self-pay | Admitting: *Deleted

## 2016-03-23 MED ORDER — LIDOCAINE 5 % EX OINT: 1.0000 "application " | TOPICAL_OINTMENT | CUTANEOUS | 0 refills | Status: DC | PRN

## 2016-03-23 NOTE — Telephone Encounter (Signed)
Opened in error

## 2016-03-23 NOTE — Telephone Encounter (Signed)
Called pt re: [facility] topical [facility]intment. I left a message stating that her refill request has been approved and sent to her pharmacy.  If she has questions she may call back.

## 2016-03-28 ENCOUNTER — Other Ambulatory Visit: Payer: Self-pay

## 2016-03-28 MED ORDER — POLYETHYLENE GLYCOL 3350 17 G PO PACK: 17.0000 g | PACK | Freq: Every day | ORAL | 2 refills | Status: DC

## 2016-03-28 NOTE — Telephone Encounter (Signed)
Patient requesting a refill on polyethylene glycol called into her pharmacy. Refill has been approved at this time.

## 2016-04-11 ENCOUNTER — Ambulatory Visit (INDEPENDENT_AMBULATORY_CARE_PROVIDER_SITE_OTHER): Payer: Medicaid Other | Admitting: Advanced Practice Midwife

## 2016-04-11 ENCOUNTER — Encounter: Payer: Self-pay | Admitting: Advanced Practice Midwife

## 2016-04-11 ENCOUNTER — Ambulatory Visit: Payer: Medicaid Other | Admitting: Family Medicine

## 2016-04-11 DIAGNOSIS — K5901 Slow transit constipation: Secondary | ICD-10-CM

## 2016-04-11 DIAGNOSIS — Z30017 Encounter for initial prescription of implantable subdermal contraceptive: Secondary | ICD-10-CM

## 2016-04-11 DIAGNOSIS — K649 Unspecified hemorrhoids: Secondary | ICD-10-CM

## 2016-04-11 DIAGNOSIS — M5432 Sciatica, left side: Secondary | ICD-10-CM

## 2016-04-11 DIAGNOSIS — Z3049 Encounter for surveillance of other contraceptives: Secondary | ICD-10-CM | POA: Diagnosis not present

## 2016-04-11 MED ORDER — IBUPROFEN 600 MG PO TABS: 600.0000 mg | ORAL_TABLET | Freq: Four times a day (QID) | ORAL | 0 refills | Status: DC

## 2016-04-11 MED ORDER — ETONOGESTREL 68 MG ~~LOC~~ IMPL
68.0000 mg | DRUG_IMPLANT | Freq: Once | SUBCUTANEOUS | Status: AC
  Administered 2016-04-11: 68 mg via SUBCUTANEOUS

## 2016-04-11 MED ORDER — LIDOCAINE 5 % EX OINT: 1.0000 "application " | TOPICAL_OINTMENT | CUTANEOUS | 6 refills | Status: DC | PRN

## 2016-04-11 MED ORDER — CYCLOBENZAPRINE HCL 10 MG PO TABS: 5.0000 mg | ORAL_TABLET | Freq: Three times a day (TID) | ORAL | 0 refills | Status: DC | PRN

## 2016-04-11 MED ORDER — HYDROCORTISONE ACE-PRAMOXINE 1-1 % RE FOAM: 1.0000 | Freq: Two times a day (BID) | RECTAL | 10 refills | Status: DC

## 2016-04-11 NOTE — Progress Notes (Signed)
Subjective:     Paula Stark is a 26 y.o. female who presents for a postpartum visit. She is 6 weeks postpartum following a spontaneous vaginal delivery. I have fully reviewed the prenatal and intrapartum course. The delivery was at 39 gestational weeks. Outcome: spontaneous vaginal delivery. Anesthesia: epidural. Postpartum course has been normal. Baby's course has been normal. Baby is feeding by beast and bottle. Bleeding staining only. Bowel function is normal. Bladder function is normal. Patient is not sexually active. Contraception method is none. Postpartum depression screening: negative.  The following portions of the patient's history were reviewed and updated as appropriate: allergies, current medications, past family history, past medical history, past social history, past surgical history and problem list.  Review of Systems Pertinent items noted in HPI and remainder of comprehensive ROS otherwise negative.   Objective:    BP 118/83   Pulse 73   Wt 169 lb 1.6 oz (76.7 kg)   BMI 30.93 kg/m   VS reviewed, nursing note reviewed,  Constitutional: well developed, well nourished, no distress HEENT: normocephalic CV: normal rate Pulm/chest wall: normal effort Abdomen: soft Neuro: alert and oriented x 3 Skin: warm, dry Psych: affect normal   Nexplanon Insertion Procedure Patient identified, informed consent performed, consent signed.   Patient does understand that irregular bleeding is a very common side effect of this medication. She was advised to have backup contraception for one week after placement. Pregnancy test in clinic today was negative.  Appropriate time out taken.  Patient's left arm was prepped and draped in the usual sterile fashion. Patient was prepped with alcohol swab and then injected with 3 ml of 1% lidocaine.  She was prepped with betadine, Nexplanon removed from packaging,  Device confirmed in needle, then inserted full length of needle and withdrawn per handbook  instructions. Nexplanon was able to palpated in the patient's arm; patient palpated the insert herself. There was minimal blood loss.  Patient insertion site covered with guaze and a pressure bandage to reduce any bruising.  The patient tolerated the procedure well and was given post procedure instructions.    Assessment:      1. Postpartum examination following vaginal delivery   2. Sciatica of left side --Discussed exercises, rest/ice/heat/Tylenol PRN for pain. - ibuprofen (ADVIL,MOTRIN) 600 MG tablet; Take 1 tablet (600 mg total) by mouth every 6 (six) hours.  Dispense: 30 tablet; Refill: 0 - cyclobenzaprine (FLEXERIL) 10 MG tablet; Take 0.5-1 tablets (5-10 mg total) by mouth every 8 (eight) hours as needed for muscle spasms.  Dispense: 30 tablet; Refill: 0  3. Hemorrhoids, unspecified hemorrhoid type  - hydrocortisone-pramoxine (PROCTOFOAM-HC) rectal foam; Place 1 applicator rectally 2 (two) times daily.  Dispense: 10 g; Refill: 10 - lidocaine (XYLOCAINE) 5 % ointment; Apply 1 application topically as needed.  Dispense: 35.44 g; Refill: 6  4. Slow transit constipation --Discussed dietary changes.  Pt to continue stool softeners PRN.  5. Nexplanon insertion --Discussed benefits/risks/alternatives.  Pt desires Nexplanon so inserted today. See above note.  Plan:     Follow up in: 1 year or as needed.

## 2016-04-11 NOTE — Patient Instructions (Signed)
Sciatica Sciatica is pain, numbness, weakness, or tingling along the path of the sciatic nerve. The sciatic nerve starts in the lower back and runs down the back of each leg. The nerve controls the muscles in the lower leg and in the back of the knee. It also provides feeling (sensation) to the back of the thigh, the lower leg, and the sole of the foot. Sciatica is a symptom of another medical condition that pinches or puts pressure on the sciatic nerve. Generally, sciatica only affects one side of the body. Sciatica usually goes away on its own or with treatment. In some cases, sciatica may keep coming back (recur). What are the causes? This condition is caused by pressure on the sciatic nerve, or pinching of the sciatic nerve. This may be the result of:  A disk in between the bones of the spine (vertebrae) bulging out too far (herniated disk).  Age-related changes in the spinal disks (degenerative disk disease).  A pain disorder that affects a muscle in the buttock (piriformis syndrome).  Extra bone growth (bone spur) near the sciatic nerve.  An injury or break (fracture) of the pelvis.  Pregnancy.  Tumor (rare). What increases the risk? The following factors may make you more likely to develop this condition:  Playing sports that place pressure or stress on the spine, such as football or weight lifting.  Having poor strength and flexibility.  A history of back injury.  A history of back surgery.  Sitting for long periods of time.  Doing activities that involve repetitive bending or lifting.  Obesity. What are the signs or symptoms? Symptoms can vary from mild to very severe, and they may include:  Any of these problems in the lower back, leg, hip, or buttock:  Mild tingling or dull aches.  Burning sensations.  Sharp pains.  Numbness in the back of the calf or the sole of the foot.  Leg weakness.  Severe back pain that makes movement difficult. These symptoms may  get worse when you cough, sneeze, or laugh, or when you sit or stand for long periods of time. Being overweight may also make symptoms worse. In some cases, symptoms may recur over time. How is this diagnosed? This condition may be diagnosed based on:  Your symptoms.  A physical exam. Your health care provider may ask you to do certain movements to check whether those movements trigger your symptoms.  You may have tests, including:  Blood tests.  X-rays.  MRI.  CT scan. How is this treated? In many cases, this condition improves on its own, without any treatment. However, treatment may include:  Reducing or modifying physical activity during periods of pain.  Exercising and stretching to strengthen your abdomen and improve the flexibility of your spine.  Icing and applying heat to the affected area.  Medicines that help:  To relieve pain and swelling.  To relax your muscles.  Injections of medicines that help to relieve pain, irritation, and inflammation around the sciatic nerve (steroids).  Surgery. Follow these instructions at home: Medicines   Take over-the-counter and prescription medicines only as told by your health care provider.  Do not drive or operate heavy machinery while taking prescription pain medicine. Managing pain   If directed, apply ice to the affected area.  Put ice in a plastic bag.  Place a towel between your skin and the bag.  Leave the ice on for 20 minutes, 2-3 times a day.  After icing, apply heat to the   affected area before you exercise or as often as told by your health care provider. Use the heat source that your health care provider recommends, such as a moist heat pack or a heating pad.  Place a towel between your skin and the heat source.  Leave the heat on for 20-30 minutes.  Remove the heat if your skin turns bright red. This is especially important if you are unable to feel pain, heat, or cold. You may have a greater risk of  getting burned. Activity   Return to your normal activities as told by your health care provider. Ask your health care provider what activities are safe for you.  Avoid activities that make your symptoms worse.  Take brief periods of rest throughout the day. Resting in a lying or standing position is usually better than sitting to rest.  When you rest for longer periods, mix in some mild activity or stretching between periods of rest. This will help to prevent stiffness and pain.  Avoid sitting for long periods of time without moving. Get up and move around at least one time each hour.  Exercise and stretch regularly, as told by your health care provider.  Do not lift anything that is heavier than 10 lb (4.5 kg) while you have symptoms of sciatica. When you do not have symptoms, you should still avoid heavy lifting, especially repetitive heavy lifting.  When you lift objects, always use proper lifting technique, which includes:  Bending your knees.  Keeping the load close to your body.  Avoiding twisting. General instructions   Use good posture.  Avoid leaning forward while sitting.  Avoid hunching over while standing.  Maintain a healthy weight. Excess weight puts extra stress on your back and makes it difficult to maintain good posture.  Wear supportive, comfortable shoes. Avoid wearing high heels.  Avoid sleeping on a mattress that is too soft or too hard. A mattress that is firm enough to support your back when you sleep may help to reduce your pain.  Keep all follow-up visits as told by your health care provider. This is important. Contact a health care provider if:  You have pain that wakes you up when you are sleeping.  You have pain that gets worse when you lie down.  Your pain is worse than you have experienced in the past.  Your pain lasts longer than 4 weeks.  You experience unexplained weight loss. Get help right away if:  You lose control of your bowel  or bladder (incontinence).  You have:  Weakness in your lower back, pelvis, buttocks, or legs that gets worse.  Redness or swelling of your back.  A burning sensation when you urinate. This information is not intended to replace advice given to you by your health care provider. Make sure you discuss any questions you have with your health care provider. Document Released: 02/28/2001 Document Revised: 08/10/2015 Document Reviewed: 11/13/2014 Elsevier Interactive Patient Education  2017 [employer] Inc[employer]  

## 2016-04-12 MED ORDER — LIDOCAINE 5 % EX OINT: 1.0000 "application " | TOPICAL_OINTMENT | CUTANEOUS | 6 refills | Status: DC | PRN

## 2016-04-12 NOTE — Addendum Note (Signed)
Addended by: Gerome ApleyZEYFANG, LINDA L on: 04/12/2016 07:59 AM   Modules accepted: Orders

## 2016-04-13 LAB — POCT PREGNANCY, URINE: Preg Test, Ur: NEGATIVE

## 2016-04-30 ENCOUNTER — Other Ambulatory Visit: Payer: Self-pay | Admitting: Obstetrics & Gynecology

## 2016-12-03 IMAGING — US US OB TRANSVAGINAL
1 series · 15 of 19 positions shown · non-contrast
Comparison: Ultrasound 03/25/2015.

CLINICAL DATA: First trimester pregnancy with vaginal spotting.
Inconclusive viability on prior study. LMP 02/14/2015. Beta HCG
level 46,321 yesterday and 22,194 11 days ago.

EXAM:
TRANSVAGINAL OB ULTRASOUND
TECHNIQUE: Transvaginal ultrasound was performed for complete evaluation of the
gestation as well as the maternal uterus, adnexal regions, and
pelvic cul-de-sac.

[Series 1: us ob transvaginal · 19 acquisitions, 15 frames shown]
[im 1/19]
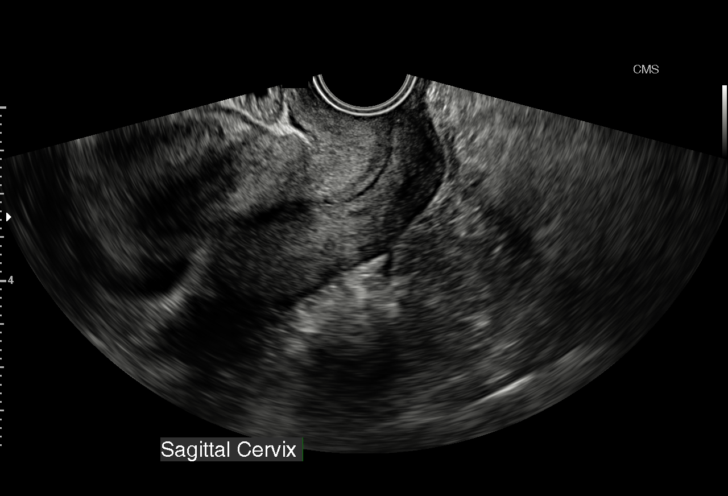
[im 2/19]
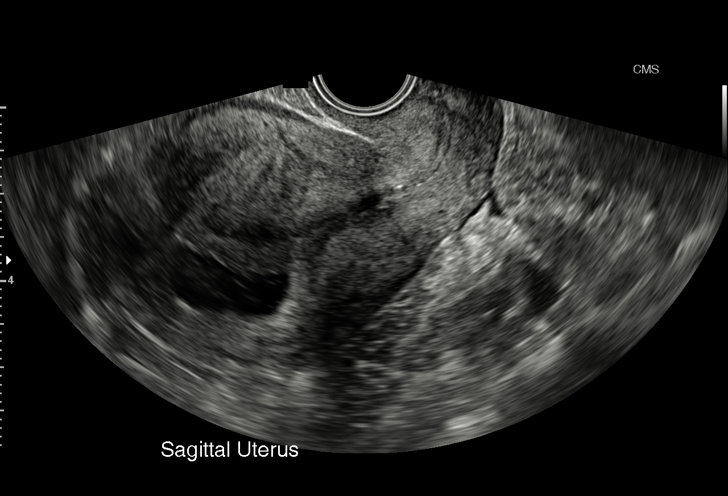
[im 4/19]
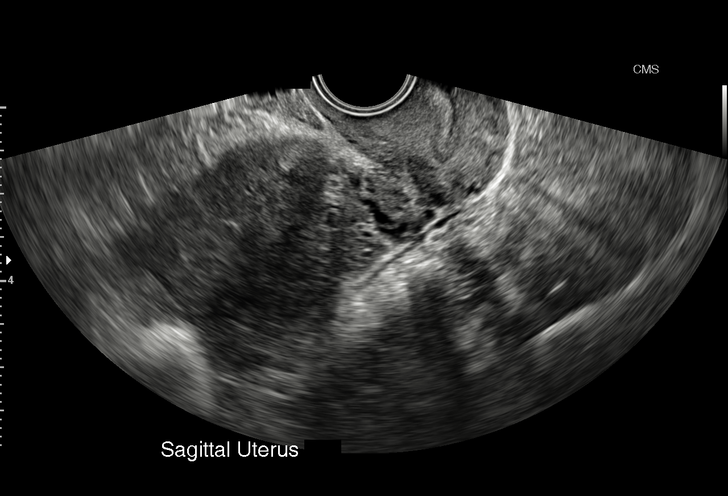
[im 5/19]
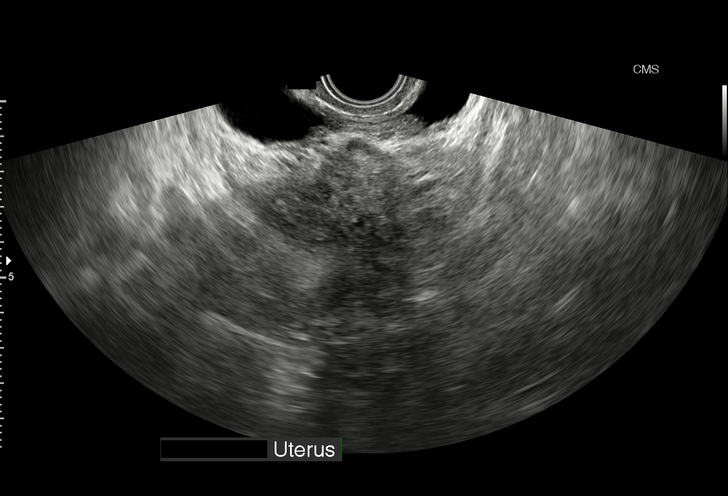
[im 6/19]
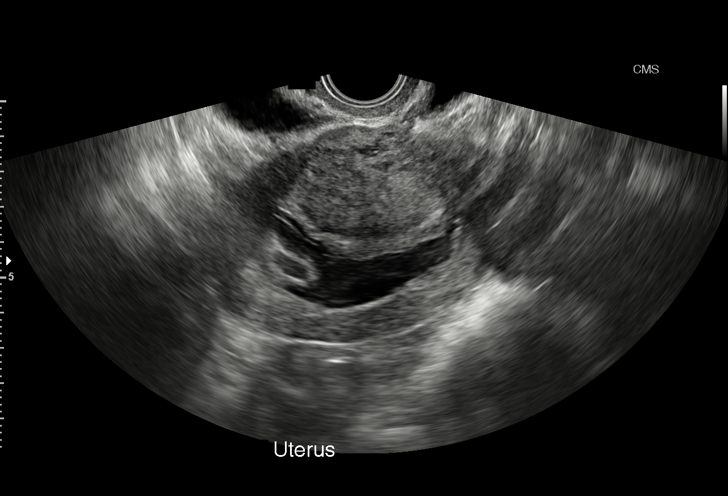
[im 7/19]
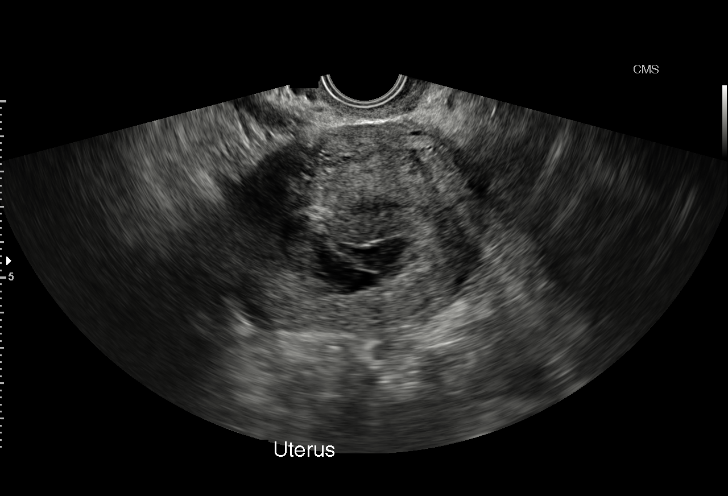
[im 9/19]
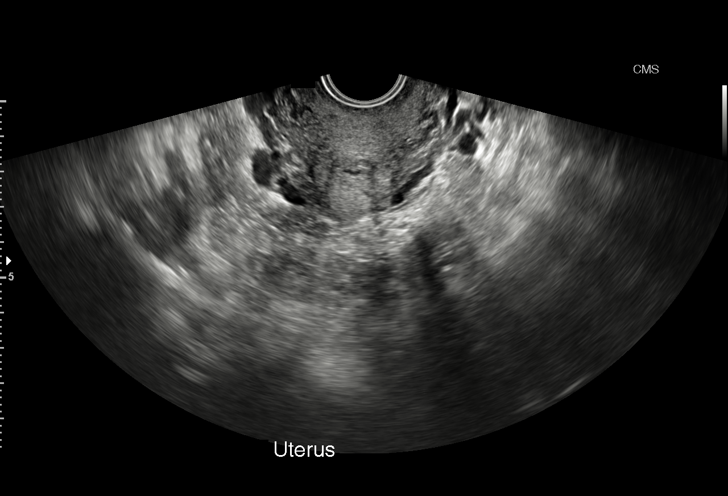
[im 10/19]
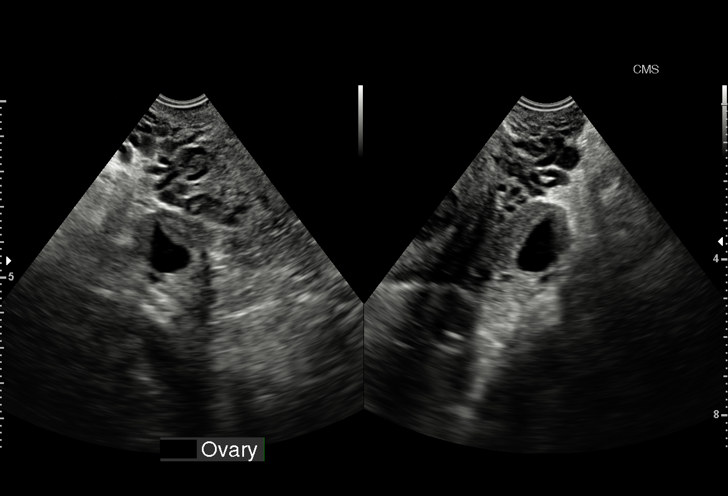
[im 11/19]
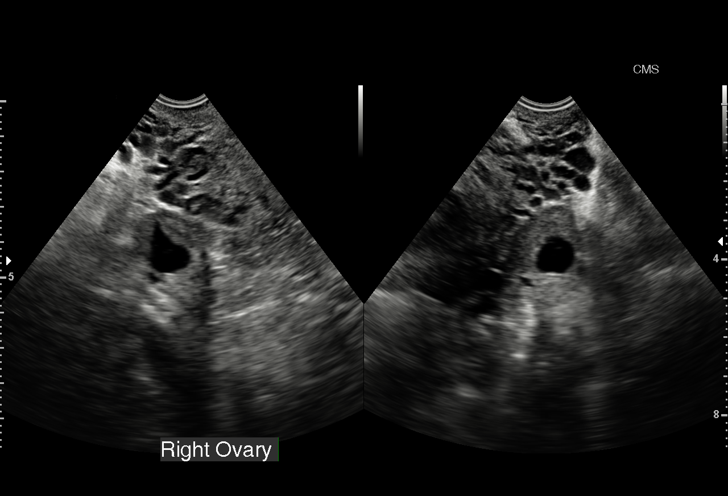
[im 13/19]
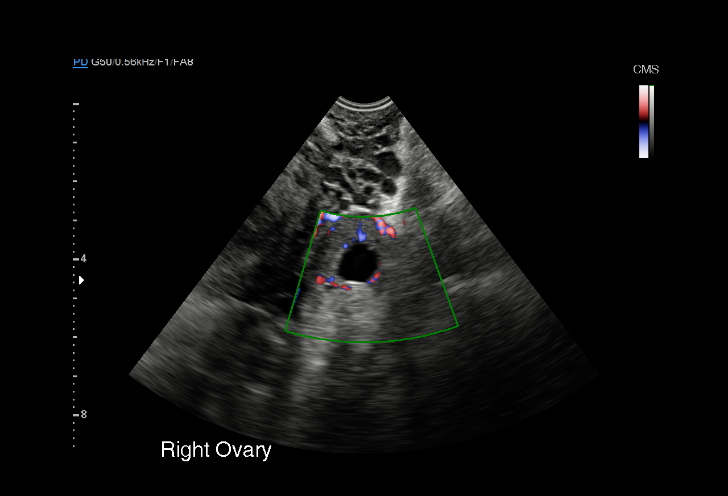
[im 14/19]
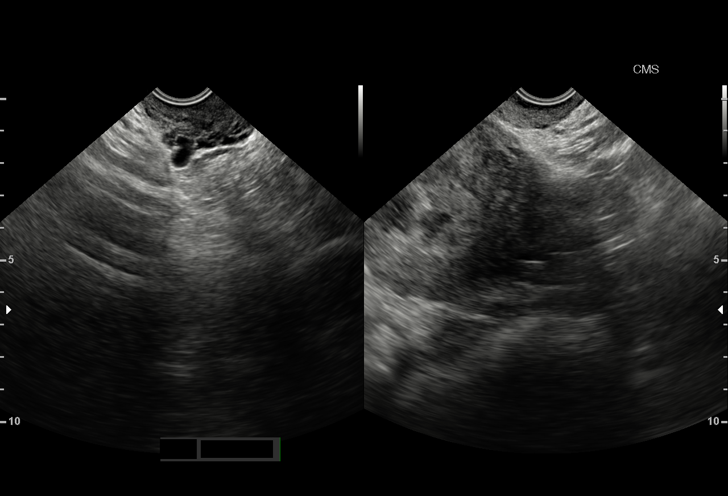
[im 15/19]
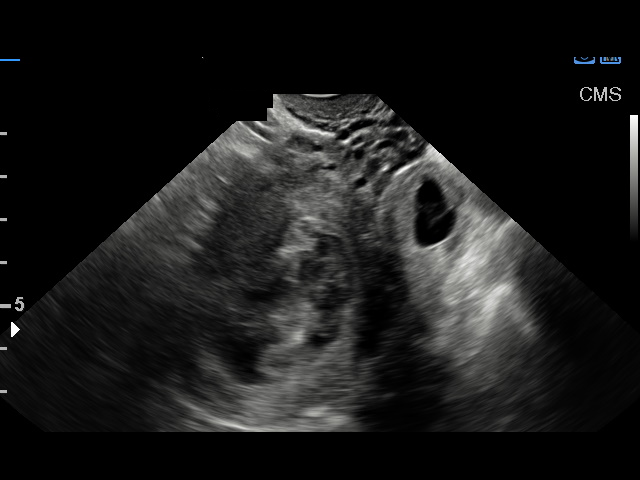
[im 16/19]
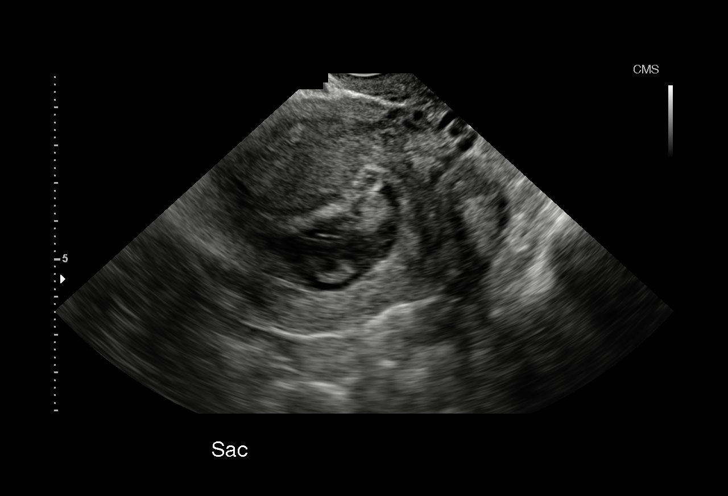
[im 18/19]
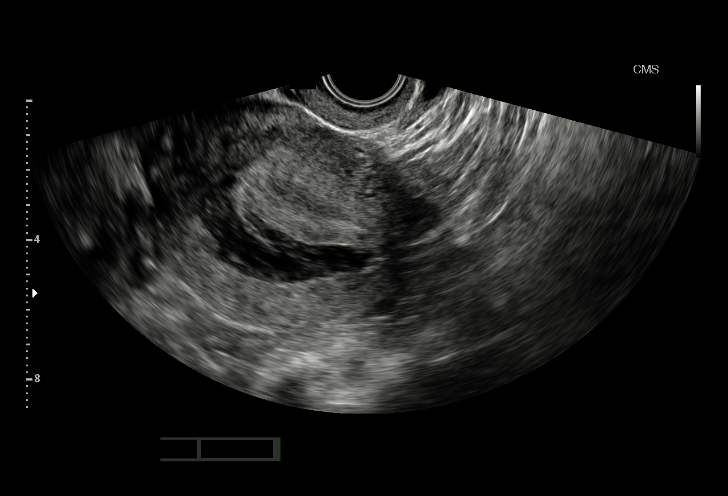
[im 19/19]
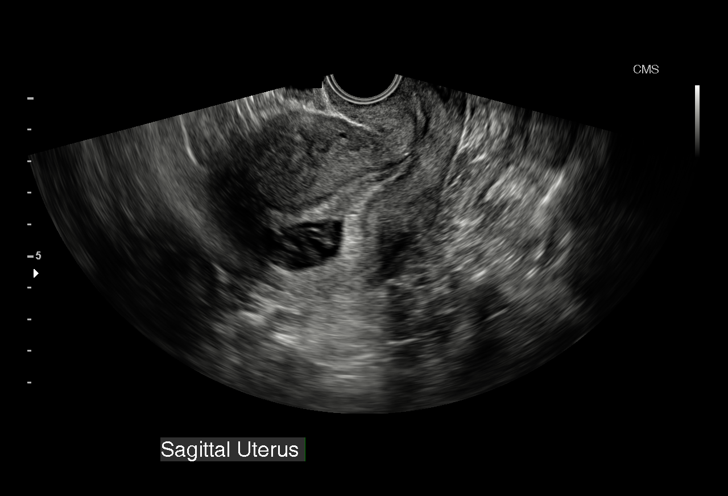

[15 of 19 positions shown; findings below may reference images not displayed]

FINDINGS: Intrauterine gestational sac: None visualized.

Subchorionic hemorrhage: There is a large complex fluid collection
within the endometrial canal, some of which may reflect subchorionic
hemorrhage.

Maternal uterus/adnexae: The right ovary is visualized and appears
normal. The left ovary is not seen. No evidence of adnexal mass. No
significant free pelvic fluid.
IMPRESSION: Enlarging complex fluid within the endometrial canal without
demonstrated intrauterine pregnancy. Findings are most consistent
with a completed spontaneous abortion. However, given the rising
beta HCG levels over the last 12 days, occult ectopic pregnancy
cannot be excluded. Continued follow-up of the beta HCG levels (and
follow-up ultrasound if necessary) recommended.

## 2017-06-06 IMAGING — US US MFM OB COMP +14 WKS
1 series · 14 of 28 positions shown · non-contrast
Comparison: none

[Series 1: us mfm ob comp +14 wks · 14 of 91 slices shown]
[im 4/91]
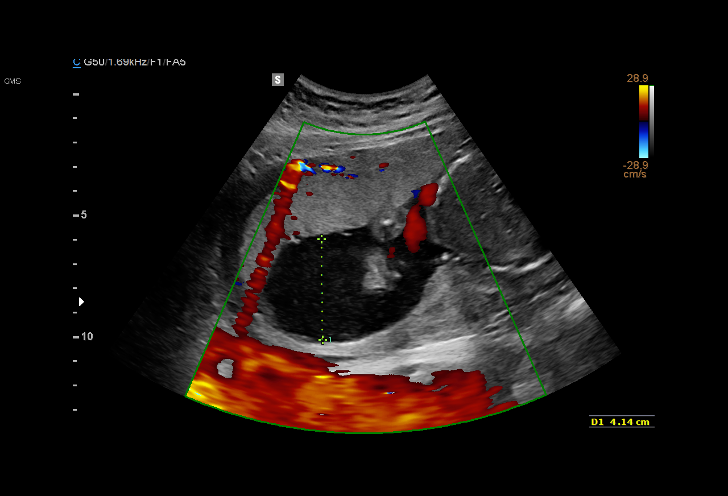
[im 11/91]
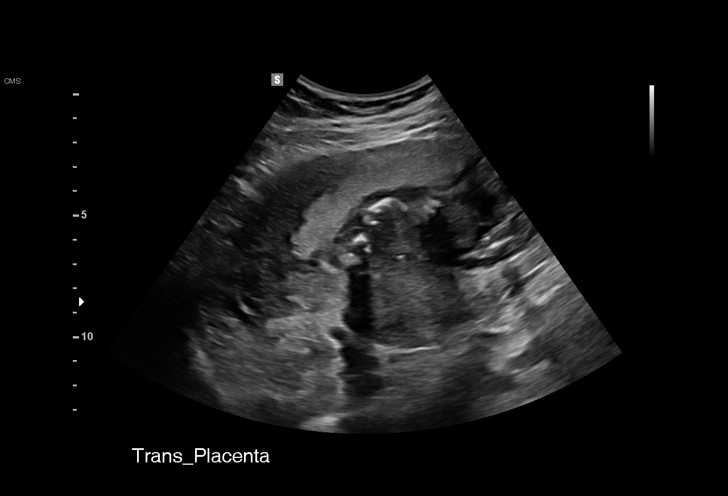
[im 17/91]
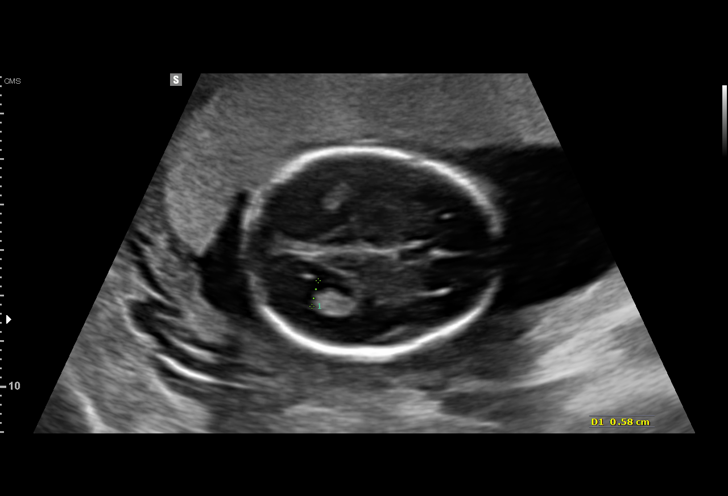
[im 24/91]
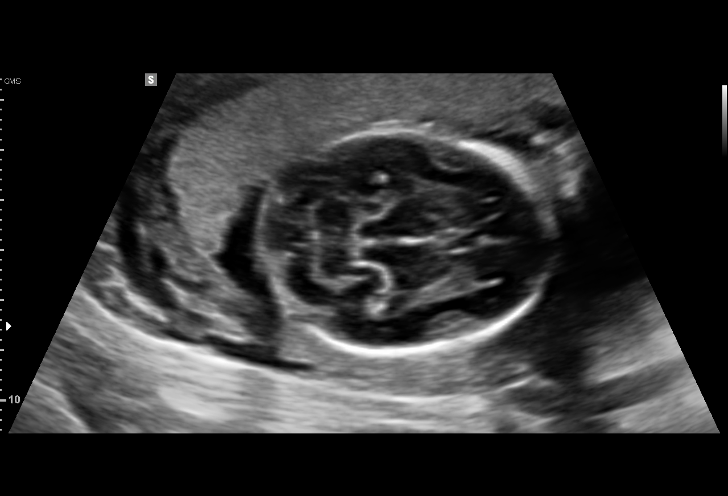
[im 31/91]
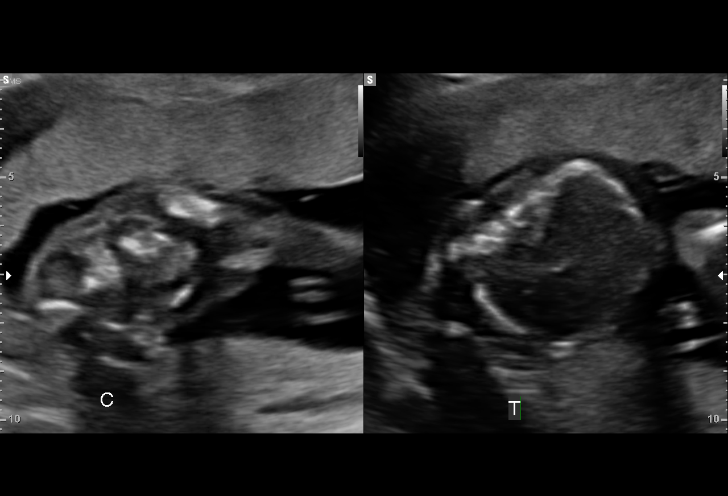
[im 37/91]
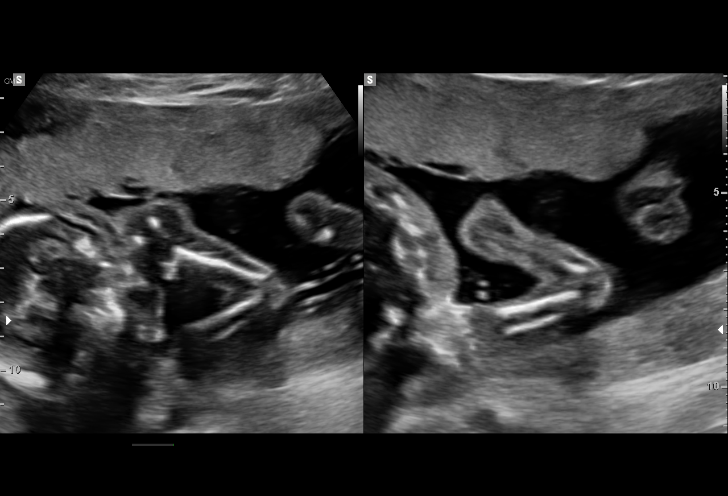
[im 44/91]
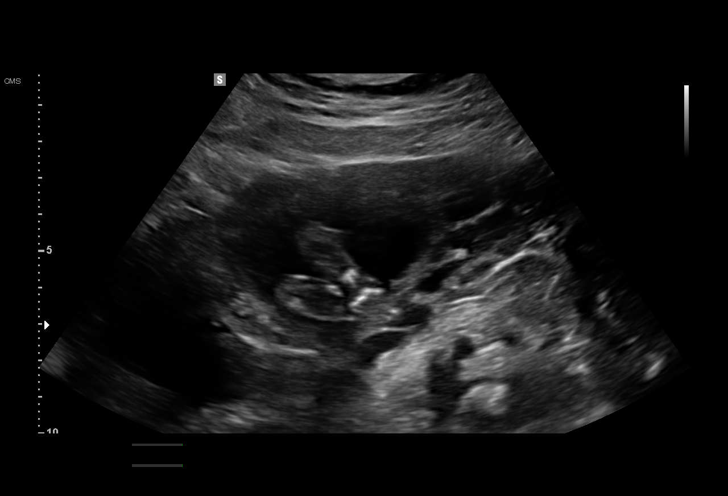
[im 51/91]
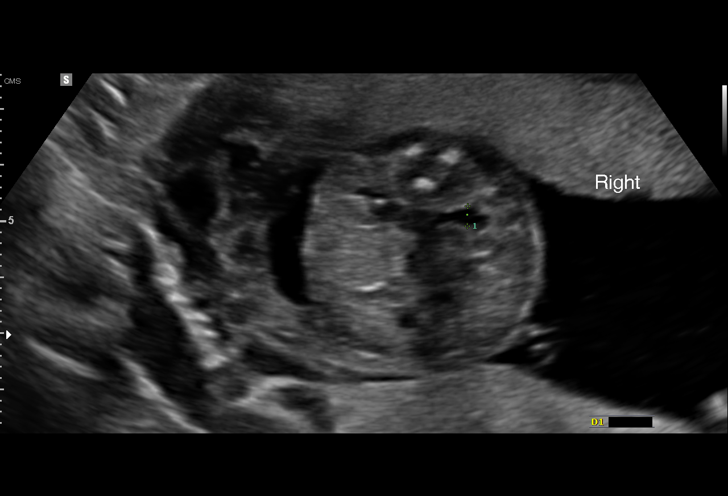
[im 57/91]
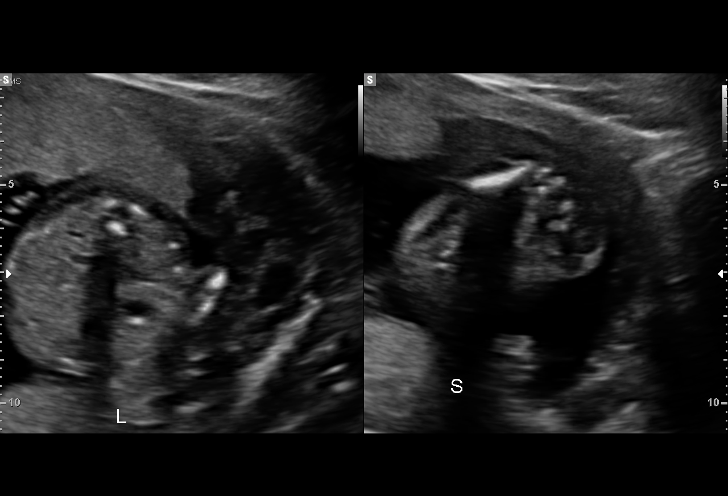
[im 64/91]
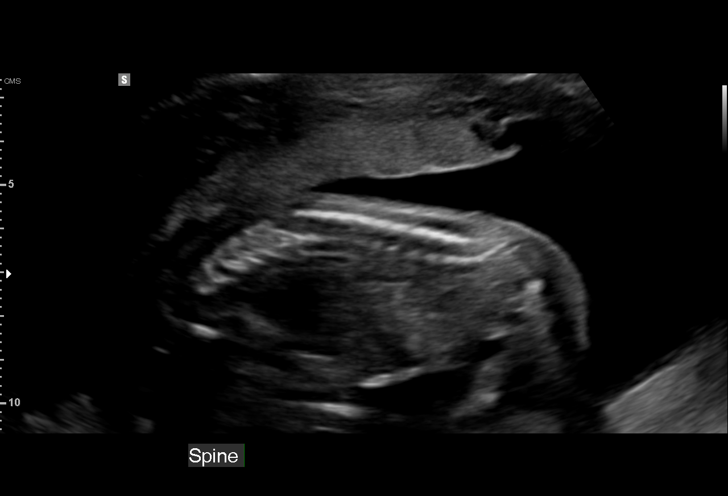
[im 71/91]
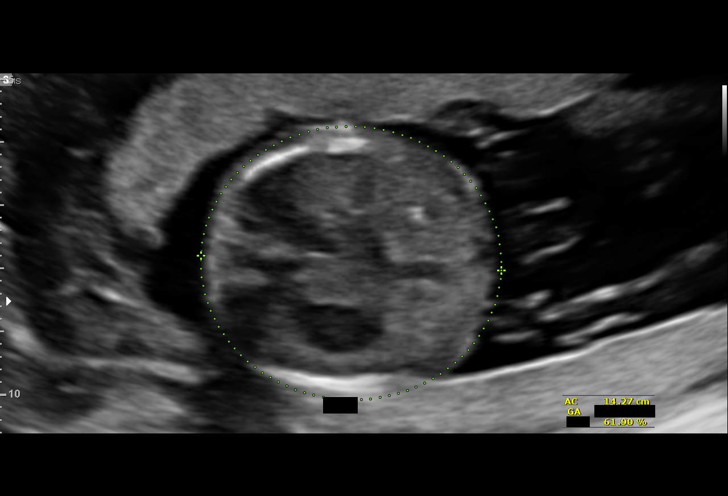
[im 77/91]
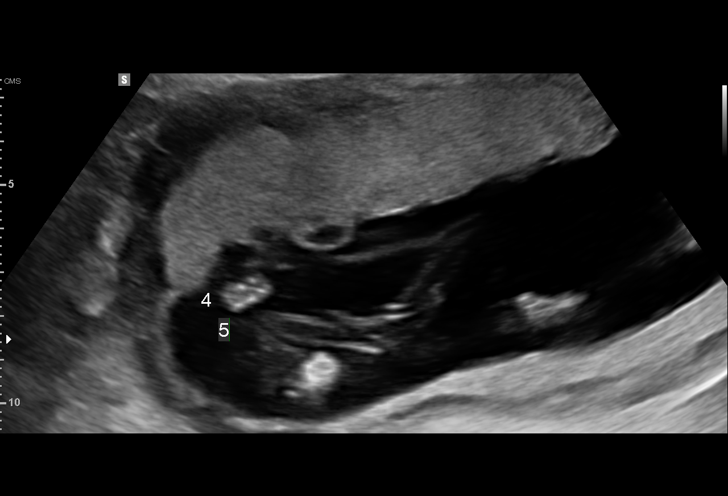
[im 84/91]
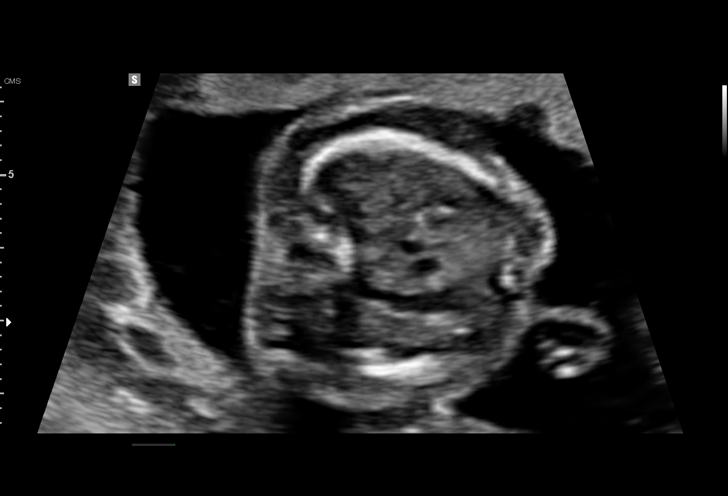
[im 91/91]
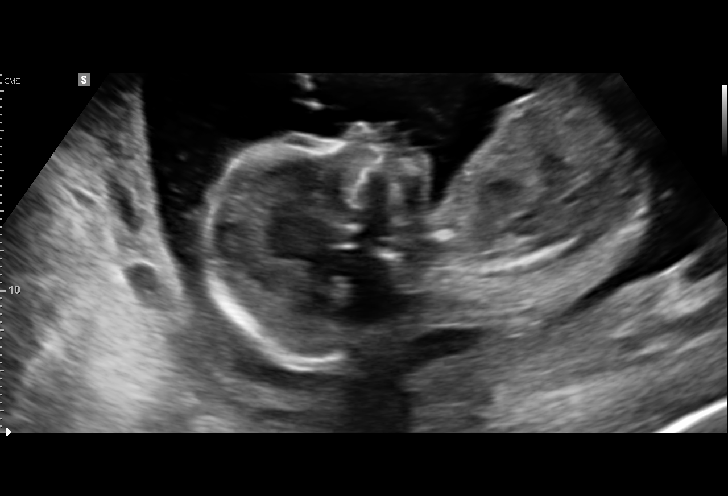

[14 of 28 positions shown; findings below may reference images not displayed]

[facility]ospital [facility]-
Faculty Physician
OB/Gyn Clinic

1  SAKU-MATTI TAMMELEHT            [id number]      [id number]     [id number]
Indications

19 weeks gestation of pregnancy
Basic anatomic survey                          Z36
OB History

Gravidity:    4         Term:   2        Prem:   0        SAB:   1
TOP:          0       Ectopic:  0        Living: 2
Fetal Evaluation

Num Of Fetuses:     1
Fetal Heart         162
Rate(bpm):
Cardiac Activity:   Observed
Presentation:       Transverse, head to maternal right
Placenta:           Anterior, above cervical os
P. Cord Insertion:  Visualized, central

Amniotic Fluid
AFI FV:      Subjectively within normal limits

Largest Pocket(cm)
4.14
Biometry

BPD:      44.2  mm     G. Age:  19w 3d         60  %    CI:        69.32   %   70 - 86
FL/HC:      16.4   %   16.1 -
HC:      169.5  mm     G. Age:  19w 4d         63  %    HC/AC:      1.18       1.09 -
AC:      143.6  mm     G. Age:  19w 5d         64  %    FL/BPD:     62.9   %
FL:       27.8  mm     G. Age:  18w 4d         22  %    FL/AC:      19.4   %   20 - 24
HUM:      26.8  mm     G. Age:  18w 4d         33  %
CER:      20.1  mm     G. Age:  19w 1d         49  %
NFT:       4.5  mm
CM:        5.9  mm

Est. FW:     279  gm    0 lb 10 oz      46  %
Gestational Age

LMP:           19w 1d       Date:   05/30/15                 EDD:   03/05/16
U/S Today:     19w 2d                                        EDD:   03/04/16
Best:          19w 1d    Det. By:   LMP  (05/30/15)          EDD:   03/05/16
Anatomy

Cranium:               Appears normal         Aortic Arch:            Appears normal
Cavum:                 Appears normal         Ductal Arch:            Appears normal
Ventricles:            Appears normal         Diaphragm:              Appears normal
Choroid Plexus:        Appears normal         Stomach:                Appears normal, left
sided
Cerebellum:            Appears normal         Abdomen:                Appears normal
Posterior Fossa:       Appears normal         Abdominal Wall:         Appears nml (cord
insert, abd wall)
Nuchal Fold:           Appears normal         Cord Vessels:           Appears normal (3
vessel cord)
Face:                  Appears normal         Kidneys:                Appear normal
(orbits and profile)
Lips:                  Appears normal         Bladder:                Appears normal
Thoracic:              Appears normal         Spine:                  Appears normal
Heart:                 Appears normal         Upper Extremities:      Appears normal
(4CH, axis, and
situs)
RVOT:                  Appears normal         Lower Extremities:      Appears normal
LVOT:                  Appears normal

Other:  Fetus appears to be a male. Heels visualized. Nasal bone visualized.
Cervix Uterus Adnexa

Cervix
Length:            4.3  cm.
Normal appearance by transabdominal scan.

Uterus
No abnormality visualized.

Left Ovary
Within normal limits.

Right Ovary
Within normal limits.

Cul De Sac:   No free fluid seen.
Adnexa:       No abnormality visualized.
Impression

Single IUP at 19w 1d
Normal fetal anatomic survey
No markers associated with aneuploidy noted
Anterior placenta without previa
Normal amniotic fluid volume
Recommendations

Follow-up ultrasounds as clinically indicated.

## 2018-03-18 ENCOUNTER — Ambulatory Visit: Payer: Self-pay | Admitting: Internal Medicine

## 2018-03-29 ENCOUNTER — Ambulatory Visit: Payer: Self-pay | Admitting: Advanced Practice Midwife

## 2018-10-04 ENCOUNTER — Telehealth: Payer: Self-pay | Admitting: Obstetrics & Gynecology

## 2018-10-04 NOTE — Telephone Encounter (Signed)
The patient called in regards to having the birth control removed. Informed the patient of the 3 year period of the nexplannon removal. Informed the patient of calling our office in December and also placing the patient on the wait list.

## 2018-12-17 ENCOUNTER — Encounter: Payer: Self-pay | Admitting: Medical

## 2018-12-17 ENCOUNTER — Other Ambulatory Visit: Payer: Self-pay

## 2018-12-17 ENCOUNTER — Ambulatory Visit (INDEPENDENT_AMBULATORY_CARE_PROVIDER_SITE_OTHER): Payer: Medicaid Other | Admitting: Medical

## 2018-12-17 VITALS — BP 122/82 | HR 79 | Ht 62.0 in | Wt 181.4 lb

## 2018-12-17 DIAGNOSIS — Z3046 Encounter for surveillance of implantable subdermal contraceptive: Secondary | ICD-10-CM

## 2018-12-17 DIAGNOSIS — Z3009 Encounter for other general counseling and advice on contraception: Secondary | ICD-10-CM

## 2018-12-17 MED ORDER — NORGESTIMATE-ETH ESTRADIOL 0.25-35 MG-MCG PO TABS: 1.0000 | ORAL_TABLET | Freq: Every day | ORAL | 11 refills | Status: DC

## 2018-12-17 NOTE — Progress Notes (Signed)
GYNECOLOGY CLINIC PROCEDURE NOTE  [name] is a 28 y.o. [id number] here for Nexplanon removal. No GYN concerns.  Last pap smear was on 2017 and was normal.  No other gynecologic concerns.  Nexplanon Removal Patient was given informed consent for removal of her Nexplanon.  Appropriate time out taken. Nexplanon site identified.  Area prepped in usual sterile fashon. Three ml of 1% lidocaine was used to anesthetize the area at the distal end of the implant. A small stab incision was made right beside the implant on the distal portion.  The Nexplanon rod was grasped using hemostats and removed without difficulty.  There was minimal blood loss. There were no complications.  A steri-strip was applied over the small incision.  A pressure bandage was applied to reduce any bruising.  The patient tolerated the procedure well and was given post procedure instructions.  Patient is planning to use OCPS for contraception. Rx sent to patient's pharmacy.   Luvenia Redden, PA-C 12/17/2018 4:32 PM

## 2018-12-17 NOTE — Patient Instructions (Signed)

## 2019-02-10 ENCOUNTER — Other Ambulatory Visit: Payer: Self-pay

## 2019-02-10 DIAGNOSIS — Z20822 Contact with and (suspected) exposure to covid-19: Secondary | ICD-10-CM

## 2019-02-10 DIAGNOSIS — Z20828 Contact with and (suspected) exposure to other viral communicable diseases: Secondary | ICD-10-CM | POA: Diagnosis not present

## 2019-02-11 LAB — NOVEL CORONAVIRUS, NAA: SARS-CoV-2, NAA: NOT DETECTED

## 2019-07-14 ENCOUNTER — Encounter: Payer: Self-pay | Admitting: Obstetrics and Gynecology

## 2019-07-14 ENCOUNTER — Other Ambulatory Visit (HOSPITAL_COMMUNITY)
Admission: RE | Admit: 2019-07-14 | Discharge: 2019-07-14 | Disposition: A | Discharge status: Home / Self Care | Payer: Medicaid Other | Source: Ambulatory Visit | Attending: Obstetrics and Gynecology | Admitting: Obstetrics and Gynecology

## 2019-07-14 ENCOUNTER — Ambulatory Visit (INDEPENDENT_AMBULATORY_CARE_PROVIDER_SITE_OTHER): Payer: Medicaid Other | Admitting: Obstetrics and Gynecology

## 2019-07-14 ENCOUNTER — Other Ambulatory Visit: Payer: Self-pay

## 2019-07-14 VITALS — BP 127/87 | HR 72 | Ht 62.0 in | Wt 182.3 lb

## 2019-07-14 DIAGNOSIS — N898 Other specified noninflammatory disorders of vagina: Secondary | ICD-10-CM | POA: Diagnosis not present

## 2019-07-14 DIAGNOSIS — R102 Pelvic and perineal pain: Secondary | ICD-10-CM

## 2019-07-14 DIAGNOSIS — Z01419 Encounter for gynecological examination (general) (routine) without abnormal findings: Secondary | ICD-10-CM | POA: Diagnosis not present

## 2019-07-14 NOTE — Progress Notes (Signed)
   WELL-WOMAN PHYSICAL & PAP Patient name: Paula Stark MRN [id number]  Date of birth: 1990-09-17 Chief Complaint:   Gynecologic Exam  History of Present Illness:   Paula Stark is a 29 y.o. [phone] Asian female being seen today for a routine well-woman exam.  Current complaints: itching in the vaginal area. She had her Nexplanon removed 11/2018 and took OCPs for 1 month, because she wants to TTC a girl.  PCP: none      does not desire labs Patient's last menstrual period was 07/02/2019. The current method of family planning is none.  Last pap 10/07/2015. Results were: normal Last mammogram: n/a. Last colonoscopy: n/a.  Review of Systems:   Pertinent items are noted in HPI Denies any headaches, blurred vision, fatigue, shortness of breath, chest pain, abdominal pain, abnormal vaginal discharge/itching/odor/irritation, problems with periods, bowel movements, urination, or intercourse unless otherwise stated above. Pertinent History Reviewed:  Reviewed past medical,surgical, social and family history.  Reviewed problem list, medications and allergies. Physical Assessment:   Vitals:   07/14/19 0957  BP: 127/87  Pulse: 72  Weight: 182 lb 4.8 oz (82.7 kg)  Height: 5\' 2"  (1.575 m)  Body mass index is 33.34 kg/m.        Physical Examination:   General appearance - well appearing, and in no distress  Mental status - alert, oriented to person, place, and time  Psych:  She has a normal mood and affect  Skin - warm and dry, normal color, no suspicious lesions noted  Chest - effort normal, all lung fields clear to auscultation bilaterally  Heart - normal rate and regular rhythm  Neck:  midline trachea, no thyromegaly or nodules  Breasts - breasts appear normal, no suspicious masses, no skin or nipple changes or  axillary nodes  Abdomen - soft, nontender, nondistended, no masses or organomegaly  Pelvic - VULVA: normal appearing vulva with no masses, tenderness or lesions  VAGINA: normal appearing  vagina with normal color and discharge, no lesions  CERVIX: normal appearing cervix without discharge or lesions, no CMT  Thin prep pap is done with HR HPV cotesting  UTERUS: uterus is felt to be normal size, shape, consistency and nontender   ADNEXA: No adnexal masses or tenderness noted.  Rectal - deferred  Extremities:  No swelling or varicosities noted  No results found for this or any previous visit (from the past 24 hour(s)).  Assessment & Plan:  1) Well woman exam with routine gynecological exam - Plan: Cytology - PAP( [facility])  2) Itching of vagina - Cervicovaginal ancillary only( [facility])  3) Pelvic pain in female - Normal Bimanual - Will consider pelvic U/S, if pain persist  Labs/procedures today: pap   No orders of the defined types were placed in this encounter.   Meds: No orders of the defined types were placed in this encounter.   Follow-up: Return in about 1 year (around 07/13/2020) for Annual Exam.  07/15/2020 MSN, CNM 07/14/2019 10:25 AM

## 2019-07-15 LAB — CYTOLOGY - PAP
Comment: NEGATIVE
Diagnosis: NEGATIVE
High risk HPV: NEGATIVE

## 2019-09-24 DIAGNOSIS — Z3009 Encounter for other general counseling and advice on contraception: Secondary | ICD-10-CM | POA: Diagnosis not present

## 2019-09-24 DIAGNOSIS — Z3200 Encounter for pregnancy test, result unknown: Secondary | ICD-10-CM | POA: Diagnosis not present

## 2019-09-25 ENCOUNTER — Encounter (HOSPITAL_COMMUNITY): Payer: Self-pay | Admitting: Obstetrics and Gynecology

## 2019-09-25 ENCOUNTER — Other Ambulatory Visit: Payer: Self-pay

## 2019-09-25 ENCOUNTER — Inpatient Hospital Stay (HOSPITAL_COMMUNITY): Payer: Medicaid Other

## 2019-09-25 ENCOUNTER — Inpatient Hospital Stay (HOSPITAL_COMMUNITY)
Admission: AD | Admit: 2019-09-25 | Discharge: 2019-09-25 | Disposition: A | Discharge status: Home / Self Care | Payer: Medicaid Other | Attending: Obstetrics and Gynecology | Admitting: Obstetrics and Gynecology

## 2019-09-25 DIAGNOSIS — Z3A01 Less than 8 weeks gestation of pregnancy: Secondary | ICD-10-CM

## 2019-09-25 DIAGNOSIS — O210 Mild hyperemesis gravidarum: Secondary | ICD-10-CM

## 2019-09-25 DIAGNOSIS — N898 Other specified noninflammatory disorders of vagina: Secondary | ICD-10-CM | POA: Diagnosis not present

## 2019-09-25 DIAGNOSIS — O3481 Maternal care for other abnormalities of pelvic organs, first trimester: Secondary | ICD-10-CM | POA: Diagnosis not present

## 2019-09-25 DIAGNOSIS — O09291 Supervision of pregnancy with other poor reproductive or obstetric history, first trimester: Secondary | ICD-10-CM | POA: Insufficient documentation

## 2019-09-25 DIAGNOSIS — O99891 Other specified diseases and conditions complicating pregnancy: Secondary | ICD-10-CM | POA: Diagnosis not present

## 2019-09-25 DIAGNOSIS — R1030 Lower abdominal pain, unspecified: Secondary | ICD-10-CM | POA: Insufficient documentation

## 2019-09-25 DIAGNOSIS — R300 Dysuria: Secondary | ICD-10-CM | POA: Insufficient documentation

## 2019-09-25 DIAGNOSIS — O26891 Other specified pregnancy related conditions, first trimester: Secondary | ICD-10-CM | POA: Insufficient documentation

## 2019-09-25 DIAGNOSIS — N83202 Unspecified ovarian cyst, left side: Secondary | ICD-10-CM

## 2019-09-25 DIAGNOSIS — O4691 Antepartum hemorrhage, unspecified, first trimester: Secondary | ICD-10-CM

## 2019-09-25 DIAGNOSIS — N83292 Other ovarian cyst, left side: Secondary | ICD-10-CM | POA: Diagnosis not present

## 2019-09-25 DIAGNOSIS — O209 Hemorrhage in early pregnancy, unspecified: Secondary | ICD-10-CM | POA: Diagnosis not present

## 2019-09-25 DIAGNOSIS — O4690 Antepartum hemorrhage, unspecified, unspecified trimester: Secondary | ICD-10-CM

## 2019-09-25 DIAGNOSIS — Z6790 Unspecified blood type, Rh positive: Secondary | ICD-10-CM

## 2019-09-25 DIAGNOSIS — Z8759 Personal history of other complications of pregnancy, childbirth and the puerperium: Secondary | ICD-10-CM | POA: Insufficient documentation

## 2019-09-25 LAB — URINALYSIS, ROUTINE W REFLEX MICROSCOPIC
Bilirubin Urine: NEGATIVE
Glucose, UA: 50 mg/dL — AB
Ketones, ur: NEGATIVE mg/dL
Nitrite: NEGATIVE
Protein, ur: NEGATIVE mg/dL
Specific Gravity, Urine: 1.006 (ref 1.005–1.030)
pH: 6 (ref 5.0–8.0)

## 2019-09-25 LAB — WET PREP, GENITAL
Clue Cells Wet Prep HPF POC: NONE SEEN
Sperm: NONE SEEN
Trich, Wet Prep: NONE SEEN

## 2019-09-25 LAB — CBC
HCT: 38.6 % (ref 36.0–46.0)
Hemoglobin: 12.9 g/dL (ref 12.0–15.0)
MCH: 28.5 pg (ref 26.0–34.0)
MCHC: 33.4 g/dL (ref 30.0–36.0)
MCV: 85.4 fL (ref 80.0–100.0)
Platelets: 261 10*3/uL (ref 150–400)
RBC: 4.52 MIL/uL (ref 3.87–5.11)
RDW: 12.6 % (ref 11.5–15.5)
WBC: 8.9 10*3/uL (ref 4.0–10.5)
nRBC: 0 % (ref 0.0–0.2)

## 2019-09-25 LAB — ABO/RH: ABO/RH(D): O POS

## 2019-09-25 LAB — HCG, QUANTITATIVE, PREGNANCY: hCG, Beta Chain, Quant, S: 164993 m[IU]/mL — ABNORMAL HIGH (ref <5)

## 2019-09-25 MED ORDER — VITAFOL ULTRA 29-0.6-0.4-200 MG PO CAPS: 1.0000 | ORAL_CAPSULE | Freq: Every day | ORAL | 11 refills | Status: DC

## 2019-09-25 MED ORDER — DOXYLAMINE-PYRIDOXINE 10-10 MG PO TBEC: 2.0000 | DELAYED_RELEASE_TABLET | Freq: Every day | ORAL | 1 refills | Status: DC

## 2019-09-25 NOTE — MAU Note (Addendum)
First noted last night.  Dark brown when she wipes, also seen today. Pain in lower back and abd, started last night.  preg confirmed at health dept, copy made and sent to medical records.Marland Kitchen

## 2019-09-25 NOTE — Discharge Instructions (Signed)
Vaginal Bleeding During Pregnancy, First Trimester  A small amount of bleeding (spotting) from the vagina is common during early pregnancy. Sometimes the bleeding is normal and does not cause problems. At other times, though, bleeding may be a sign of something serious. Tell your doctor about any bleeding from your vagina right away. Follow these instructions at home: Activity  Follow your doctor's instructions about how active you can be.  If needed, make plans for someone to help with your normal activities.  Do not have sex or orgasms until your doctor says that this is safe. General instructions  Take over-the-counter and prescription medicines only as told by your doctor.  Watch your condition for any changes.  Write down: ? The number of pads you use each day. ? How often you change pads. ? How soaked (saturated) your pads are.  Do not use tampons.  Do not douche.  If you pass any tissue from your vagina, save it to show to your doctor.  Keep all follow-up visits as told by your doctor. This is important. Contact a doctor if:  You have vaginal bleeding at any time while you are pregnant.  You have cramps.  You have a fever. Get help right away if:  You have very bad cramps in your back or belly (abdomen).  You pass large clots or a lot of tissue from your vagina.  Your bleeding gets worse.  You feel light-headed.  You feel weak.  You pass out (faint).  You have chills.  You are leaking fluid from your vagina.  You have a gush of fluid from your vagina. Summary  Sometimes vaginal bleeding during pregnancy is normal and does not cause problems. At other times, bleeding may be a sign of something serious.  Tell your doctor about any bleeding from your vagina right away.  Follow your doctor's instructions about how active you can be. You may need someone to help you with your normal activities. This information is not intended to replace advice given to  you by your health care provider. Make sure you discuss any questions you have with your health care provider. Document Revised: 06/25/2018 Document Reviewed: 06/07/2016 Elsevier Patient Education  2020 [employer] Inc.   Morning Sickness  Morning sickness is when you feel sick to your stomach (nauseous) during pregnancy. You may feel sick to your stomach and throw up (vomit). You may feel sick in the morning, but you can feel this way at any time of day. Some women feel very sick to their stomach and cannot stop throwing up (hyperemesis gravidarum). Follow these instructions at home: Medicines  Take over-the-counter and prescription medicines only as told by your doctor. Do not take any medicines until you talk with your doctor about them first.  Taking multivitamins before getting pregnant can stop or lessen the harshness of morning sickness. Eating and drinking  Eat dry toast or crackers before getting out of bed.  Eat 5 or 6 small meals a day.  Eat dry and bland foods like rice and baked potatoes.  Do not eat greasy, fatty, or spicy foods.  Have someone cook for you if the smell of food causes you to feel sick or throw up.  If you feel sick to your stomach after taking prenatal vitamins, take them at night or with a snack.  Eat protein when you need a snack. Nuts, yogurt, and cheese are good choices.  Drink fluids throughout the day.  Try ginger ale made with real ginger,   ginger tea made from fresh grated ginger, or ginger candies. General instructions  Do not use any products that have nicotine or tobacco in them, such as cigarettes and e-cigarettes. If you need help quitting, ask your doctor.  Use an air purifier to keep the air in your house free of smells.  Get lots of fresh air.  Try to avoid smells that make you feel sick.  Try: ? Wearing a bracelet that is used for seasickness (acupressure wristband). ? Going to a doctor who puts thin needles into certain body  points (acupuncture) to improve how you feel. Contact a doctor if:  You need medicine to feel better.  You feel dizzy or light-headed.  You are losing weight. Get help right away if:  You feel very sick to your stomach and cannot stop throwing up.  You pass out (faint).  You have very bad pain in your belly. Summary  Morning sickness is when you feel sick to your stomach (nauseous) during pregnancy.  You may feel sick in the morning, but you can feel this way at any time of day.  Making some changes to what you eat may help your symptoms go away. This information is not intended to replace advice given to you by your health care provider. Make sure you discuss any questions you have with your health care provider. Document Revised: 02/16/2017 Document Reviewed: 04/06/2016 Elsevier Patient Education  2020 [employer].

## 2019-09-25 NOTE — MAU Provider Note (Signed)
History     CSN: [id number]  Arrival date and time: 09/25/19 1108   First Provider Initiated Contact with Patient 09/25/19 1212     [id number] @[redacted]w[redacted]d  by LMP presenting with brown discharge. Started last night, saw brown when she wiped. No recent sex. Having mild low abdominal cramping. Has not tried anything for it. Endorses dysuria but no other urinary sx. Having daily nausea and vomiting at times. She is requesting antiemetics.  OB History    Gravida  5   Para  3   Term  3   Preterm  0   AB  1   Living  3     SAB  1   TAB  0   Ectopic  0   Multiple  0   Live Births  3           Past Medical History:  Diagnosis Date  . Eczema   . Postpartum hemorrhage     Past Surgical History:  Procedure Laterality Date  . DILATION AND EVACUATION N/A 04/20/2015   Procedure: DILATATION AND EVACUATION;  Surgeon: 04/22/2015, MD;  Location: [facility];  Service: Gynecology;  Laterality: N/A;    No family history on file.  Social History   Tobacco Use  . Smoking status: Never Smoker  . Smokeless tobacco: Never Used  Substance Use Topics  . Alcohol use: No  . Drug use: No    Allergies: No Known Allergies  No medications prior to admission.    Review of Systems  Gastrointestinal: Positive for abdominal pain, nausea and vomiting.  Genitourinary: Positive for dysuria, vaginal bleeding and vaginal discharge. Negative for frequency and urgency.  Musculoskeletal: Positive for back pain.   Physical Exam   Blood pressure 98/75, pulse 91, temperature 98.1 F (36.7 C), temperature source Oral, resp. rate 16, height 5\' 2"  (1.575 m), weight 81.8 kg, last menstrual period 08/12/2019, SpO2 100 %, unknown if currently breastfeeding.  Physical Exam Vitals and nursing note reviewed. Exam conducted with a chaperone present.  Constitutional:      General: She is not in acute distress. HENT:     Head: Normocephalic.  Cardiovascular:     Rate and Rhythm: Normal rate.   Pulmonary:     Effort: Pulmonary effort is normal. No respiratory distress.  Abdominal:     General: There is no distension.     Palpations: Abdomen is soft.     Tenderness: There is no abdominal tenderness. There is no guarding or rebound.  Genitourinary:    Comments: External: no lesions or erythema Vagina: rugated, pink, moist, scant brown bloody discharge Uterus: + enlarged, anteverted, + tender, no CMT Adnexae: no masses, no tenderness left, no tenderness right Cervix closed/long  Musculoskeletal:        General: Normal range of motion.  Skin:    General: Skin is warm and dry.  Neurological:     General: No focal deficit present.     Mental Status: She is alert.  Psychiatric:        Mood and Affect: Mood normal.    Results for orders placed or performed during the hospital encounter of 09/25/19 (from the past 24 hour(s))  ABO/Rh     Status: None   Collection Time: 09/25/19 12:30 PM  Result Value Ref Range   ABO/RH(D) O POS    No rh immune globuloin      NOT A RH IMMUNE GLOBULIN CANDIDATE, PT RH POSITIVE Performed at [facility], [address], [place], [place] [address]   CBC     Status: None   Collection Time: 09/25/19 12:30 PM  Result Value Ref Range   WBC 8.9 4.0 - 10.5 K/uL   RBC 4.52 3.87 - 5.11 MIL/uL   Hemoglobin 12.9 12.0 - 15.0 g/dL   HCT 10.2 36 - 46 %   MCV 85.4 80.0 - 100.0 fL   MCH 28.5 26.0 - 34.0 pg   MCHC 33.4 30.0 - 36.0 g/dL   RDW 72.5 36.6 - 44.0 %   Platelets 261 150 - 400 K/uL   nRBC 0.0 0.0 - 0.2 %  hCG, quantitative, pregnancy     Status: Abnormal   Collection Time: 09/25/19 12:30 PM  Result Value Ref Range   hCG, Beta Chain, Quant, S 164,993 (H) <5 mIU/mL  Wet prep, genital     Status: Abnormal   Collection Time: 09/25/19 12:31 PM  Result Value Ref Range   Yeast Wet Prep HPF POC PRESENT (A) NONE SEEN   Trich, Wet Prep NONE SEEN NONE SEEN   Clue Cells Wet Prep HPF POC NONE SEEN NONE SEEN   WBC, Wet Prep HPF POC MANY (A)  NONE SEEN   Sperm NONE SEEN   Urinalysis, Routine w reflex microscopic     Status: Abnormal   Collection Time: 09/25/19 12:49 PM  Result Value Ref Range   Color, Urine YELLOW YELLOW   APPearance CLOUDY (A) CLEAR   Specific Gravity, Urine 1.006 1.005 - 1.030   pH 6.0 5.0 - 8.0   Glucose, UA 50 (A) NEGATIVE mg/dL   Hgb urine dipstick LARGE (A) NEGATIVE   Bilirubin Urine NEGATIVE NEGATIVE   Ketones, ur NEGATIVE NEGATIVE mg/dL   Protein, ur NEGATIVE NEGATIVE mg/dL   Nitrite NEGATIVE NEGATIVE   Leukocytes,Ua LARGE (A) NEGATIVE   RBC / HPF 6-10 0 - 5 RBC/hpf   WBC, UA 11-20 0 - 5 WBC/hpf   Bacteria, UA RARE (A) NONE SEEN   Squamous Epithelial / LPF 21-50 0 - 5   Mucus PRESENT    US OB LESS THAN 14 WEEKS WITH OB TRANSVAGINAL  Result Date: 09/25/2019 CLINICAL DATA:  Vaginal bleeding in first trimester of pregnancy, LMP 08/12/2019 EXAM: OBSTETRIC <14 WK [place] AND TRANSVAGINAL OB US TECHNIQUE: Both transabdominal and transvaginal ultrasound examinations were performed for complete evaluation of the gestation as well as the maternal uterus, adnexal regions, and pelvic cul-de-sac. Transvaginal technique was performed to assess early pregnancy. COMPARISON:  None for this gestation FINDINGS: Intrauterine gestational sac: Present, single Yolk sac:  Present Embryo:  Present Cardiac Activity: Present Heart Rate: 144 bpm CRL:  11.6 mm   7 w   2 d                  [place] EDC: 05/11/2020 Subchorionic hemorrhage:  None visualized Maternal uterus/adnexae: RIGHT ovary normal size and morphology, 2.5 x 1.3 x 1.2 cm. LEFT ovary measures 4.9 x 4.7 x 5.0 cm and contains a simple cyst 4.6 x 4.1 x 4.4 cm. No free pelvic fluid or adnexal masses. IMPRESSION: Single live intrauterine gestation at 7 weeks 2 days EGA by crown-rump length. Simple cyst LEFT ovary 4.6 cm diameter Electronically Signed   By: Ulyses Southward M.D.   On: 09/25/2019 13:51   MAU Course  Procedures  MDM Labs and [place] ordered and reviewed. Viable IUP on [place], no  SCH. Pt reassured. Ovarian cyst present, may be source of pain. Stable for discharge home.  Assessment and Plan  1. Vaginal bleeding in pregnancy   2. Blood type, Rh positive   3. [redacted] weeks gestation of pregnancy   4. Morning sickness   5. Left ovarian cyst    Discharge home Follow up at [facility] to start care SAB precautions Rx Diclegis  Allergies as of 09/25/2019   No Known Allergies     Medication List    TAKE these medications   Doxylamine-Pyridoxine 10-10 MG Tbec Take 2 tablets by mouth at bedtime. May also take 1 tab in am and 1 tab in afternoon   Prenatal Vitamins 0.8 MG tablet Take 1 tablet by mouth daily.   Vitafol Ultra 29-0.6-0.4-200 MG Caps Take 1 capsule by mouth daily.      Donette Larry, CNM 09/25/2019, 3:06 PM

## 2019-09-25 NOTE — MAU Note (Signed)
No urine culture due to scant urine collection

## 2019-09-26 LAB — GC/CHLAMYDIA PROBE AMP (~~LOC~~) NOT AT ARMC
Chlamydia: NEGATIVE
Comment: NEGATIVE
Comment: NORMAL
Neisseria Gonorrhea: NEGATIVE

## 2019-10-30 ENCOUNTER — Telehealth (INDEPENDENT_AMBULATORY_CARE_PROVIDER_SITE_OTHER): Payer: Medicaid Other | Admitting: *Deleted

## 2019-10-30 ENCOUNTER — Other Ambulatory Visit: Payer: Self-pay

## 2019-10-30 DIAGNOSIS — O0990 Supervision of high risk pregnancy, unspecified, unspecified trimester: Secondary | ICD-10-CM | POA: Insufficient documentation

## 2019-10-30 DIAGNOSIS — O2240 Hemorrhoids in pregnancy, unspecified trimester: Secondary | ICD-10-CM

## 2019-10-30 DIAGNOSIS — O99891 Other specified diseases and conditions complicating pregnancy: Secondary | ICD-10-CM

## 2019-10-30 DIAGNOSIS — K649 Unspecified hemorrhoids: Secondary | ICD-10-CM

## 2019-10-30 DIAGNOSIS — Z3490 Encounter for supervision of normal pregnancy, unspecified, unspecified trimester: Secondary | ICD-10-CM | POA: Insufficient documentation

## 2019-10-30 DIAGNOSIS — Z8759 Personal history of other complications of pregnancy, childbirth and the puerperium: Secondary | ICD-10-CM

## 2019-10-30 DIAGNOSIS — O99210 Obesity complicating pregnancy, unspecified trimester: Secondary | ICD-10-CM | POA: Insufficient documentation

## 2019-10-30 MED ORDER — BLOOD PRESSURE KIT DEVI: 1.0000 | 0 refills | Status: DC | PRN

## 2019-10-30 NOTE — Patient Instructions (Signed)
  At [facility], we work as an integrated team, providing care to address both physical and emotional health. Your medical provider may refer you to see our Behavioral Health Clinician (BHC) on the same day you see your medical provider, as availability permits.  Our BHC is available to all patients, visits generally last between 20-30 minutes, but can be longer or shorter, depending on patient need. The BHC offers help with stress management, coping with symptoms of depression and anxiety, major life changes , sleep issues, changing risky behavior, grief and loss, life stress, working on personal life goals, and  behavioral health issues, as these all affect your overall health and wellness.  The BHC is NOT available for the following: court-ordered evaluations, specialty assessments (custody or disability), letters to employers, or obtaining certification for an emotional support animal. The BHC does not provide long-term therapy. You have the right to refuse integrated behavioral health services, or to reschedule to see the BHC at a later date.  Exception: If you are having thoughts of suicide, we require that you either see the BHC for further assessment, or contract for safety with your medical provider prior to checking out.  Confidentiality exception: If it is suspected that a child or disabled adult is being abused or neglected, we are required by law to report that to either Child Protective Services or Adult Protective Services.  If you have a diagnosis of Bipolar affective disorder, Schizophrenia, or recurrent Major depressive disorder, we will recommend that you establish care with a psychiatrist, as these are lifelong, chronic conditions, and we want your overall emotional health and medications to be more closely monitored. If you anticipate needing extended maternity leave due to mental illness, it it recommended you inform your medical  provider, so we can put in a referral to a  psychiatrist as soon as possible. The BHC is unable to recommend an extended maternity leave for mental health issues. Your medical provider or BHC may refer you to a therapist for ongoing, traditional therapy, or to a psychiatrist, for medication management, if it would benefit your overall health. Depending on your insurance, you may have a copay to see the BHC. If you are uninsured, it is recommended that you apply for financial assistance. (Forms may be requested at the front desk for in-person visits, via MyChart, or request a form during a virtual visit).  If you see the BHC more than 6 times, you will have to complete a comprehensive clinical assessment interview with the BHC to resume integrated services.  For virtual visits with the BHC, you must be physically in the state of [place] at the time of the visit. For example, if you live in [place], you will have to do an in-person visit with the BHC. If you are going out of the state or country for any reason, the BHC may see you virtually when you return to , but not while you are physically outside of .   

## 2019-10-30 NOTE — Progress Notes (Addendum)
I connected with  Paula Stark on 10/30/19 at  9:15 AM EDT by virtually and verified that I am speaking with the correct person using two identifiers.   I discussed the limitations, risks, security and privacy concerns of performing an evaluation and management service by virtually and the availability of in person appointments. I also discussed with the patient that there may be a patient responsible charge related to this service. The patient expressed understanding and agreed to proceed. We had some lagging during virtual visit.   I explained I am completing her New OB Intake today. She had pregnancy confirmed in MAU - she was having bleeding then. States only had brown discharge 2- 3 days, then stopped. We discussed Her EDD and that it is based on  Early [place]. She reports she had 2 periods in May .  I reviewed her allergies, meds, OB History, Medical /Surgical history, and appropriate screenings. I informed her of [facility] services.  She has hemorrhoids and we discussed preventing constipation and can discuss meds she can take if needed.   She is G 5 P3013 with history of postpartum hemorrhage.  I explained we will have her take her blood pressure weekly during her pregnancy.  She verified she does not  have a blood pressure cuff.  I explained I will send a prescription for a Blood pressure cuff to [facility] and she will pick up the prescription.  I asked her to bring the blood pressure cuff with her to her first ob appointment so we can show her how to use it.  I explained after her first ob appointment   we will have her take her blood pressure weekly I  explained we will have her log her blood pressures into an app that I will send her called Babyscripts. The  app was sent to her while on phone.  I explained she will have some visits in office and some virtually. She already has Sports coach.  I reviewed her new ob  appointment date/ time with her , our location and to wear mask,  I explained she will  have a pelvic exam, ob bloodwork, hemoglobin a1C, cbg ,(pap done recently ), and  genetic testing if desired,- she does want a panorama.  I  scheduled an [place] at 19 weeks and gave her the appointment later in MyChart. She voices understanding.   Deola Rewis,RN 10/30/2019  9:21 AM  Addendum: at time of visit Patient was at home and provider was [facility]. Legrand Como

## 2019-11-12 ENCOUNTER — Encounter: Payer: Medicaid Other | Admitting: Family Medicine

## 2019-11-13 ENCOUNTER — Telehealth (INDEPENDENT_AMBULATORY_CARE_PROVIDER_SITE_OTHER): Payer: Medicaid Other | Admitting: Family Medicine

## 2019-11-13 DIAGNOSIS — Z3492 Encounter for supervision of normal pregnancy, unspecified, second trimester: Secondary | ICD-10-CM

## 2019-11-13 NOTE — Telephone Encounter (Signed)
Patient has requested a call back from the clinical staff about some brown spotting she has been experiencing.

## 2019-11-13 NOTE — Telephone Encounter (Signed)
Returned patients call in regards to brown discharge. She reports it started Monday and noted when she wipes after voiding. She denies having Sexual intercourse recently. Reviewed this can be normal in early pregnancy and if bright red bleeding like a period or severe abdominal pain then would need to be evaluated in the MAU at the [facility]. Patient voiced understanding. She has not other questions or concerns at this time.

## 2019-11-20 DIAGNOSIS — Z3490 Encounter for supervision of normal pregnancy, unspecified, unspecified trimester: Secondary | ICD-10-CM | POA: Diagnosis not present

## 2019-12-09 ENCOUNTER — Ambulatory Visit (INDEPENDENT_AMBULATORY_CARE_PROVIDER_SITE_OTHER): Payer: Medicaid Other | Admitting: Family Medicine

## 2019-12-09 ENCOUNTER — Encounter: Payer: Self-pay | Admitting: Family Medicine

## 2019-12-09 ENCOUNTER — Other Ambulatory Visit: Payer: Self-pay

## 2019-12-09 ENCOUNTER — Other Ambulatory Visit (HOSPITAL_COMMUNITY)
Admission: RE | Admit: 2019-12-09 | Discharge: 2019-12-09 | Disposition: A | Discharge status: Home / Self Care | Payer: Medicaid Other | Source: Ambulatory Visit | Attending: Family Medicine | Admitting: Family Medicine

## 2019-12-09 VITALS — BP 107/70 | HR 82 | Wt 177.2 lb

## 2019-12-09 DIAGNOSIS — O99210 Obesity complicating pregnancy, unspecified trimester: Secondary | ICD-10-CM

## 2019-12-09 DIAGNOSIS — Z31430 Encounter of female for testing for genetic disease carrier status for procreative management: Secondary | ICD-10-CM | POA: Diagnosis not present

## 2019-12-09 DIAGNOSIS — Z3492 Encounter for supervision of normal pregnancy, unspecified, second trimester: Secondary | ICD-10-CM | POA: Diagnosis not present

## 2019-12-09 DIAGNOSIS — N898 Other specified noninflammatory disorders of vagina: Secondary | ICD-10-CM

## 2019-12-09 DIAGNOSIS — Z8719 Personal history of other diseases of the digestive system: Secondary | ICD-10-CM

## 2019-12-09 DIAGNOSIS — Z3482 Encounter for supervision of other normal pregnancy, second trimester: Secondary | ICD-10-CM | POA: Diagnosis not present

## 2019-12-09 MED ORDER — ASPIRIN EC 81 MG PO TBEC: 81.0000 mg | DELAYED_RELEASE_TABLET | Freq: Every day | ORAL | 9 refills | Status: DC

## 2019-12-09 MED ORDER — HYDROCORT-PRAMOXINE (PERIANAL) 1-1 % EX FOAM: 1.0000 | Freq: Two times a day (BID) | CUTANEOUS | 10 refills | Status: DC | PRN

## 2019-12-09 NOTE — Progress Notes (Signed)
History:   Paula Stark is a 29 y.o. E5U3149 at 16w0dby early ultrasound being seen today for her first obstetrical visit.  Her obstetrical history is significant for obesity. Patient does intend to breast feed. Pregnancy history fully reviewed.  Patient reports vaginal irritation for the past month. She denies any abnormal vaginal discharge or odor. She denies dysuria, hematuria, urgency, frequency. She denies concern for STI.      HISTORY: OB History  Gravida Para Term Preterm AB Living  '5 3 3 ' 0 1 3  SAB TAB Ectopic Multiple Live Births  1 0 0 0 3    # Outcome Date GA Lbr Len/2nd Weight Sex Delivery Anes PTL Lv  5 Current           4 Term 02/29/16 345w2d2:55 / 06:10 8 lb 11.7 oz (3.96 kg) M Vag-Spont EPI  LIV     Birth Comments: caput     [facility]   Apgar1: 8  Apgar5: 9  3 SAB 2016          2 Term 05/22/13 4057w1d:31 / 03:47 8 lb (3.63 kg) M Vag-Spont EPI  LIV     [facility]  Apgar1: 9  Apgar5: 9  1 Term 07/12/11 41w9w0d46 / 00:55 7 lb 8.8 oz (3.425 kg) M Vag-Spont None  LIV     Birth Comments: none     Name: Castrellon,BOY [facility] Apgar1: 9  [facility]   Last pap smear was done 07/14/19 and was normal  Past Medical History:  Diagnosis Date  . Eczema   . Hemorrhoid   . Postpartum hemorrhage    Past Surgical History:  Procedure Laterality Date  . DILATION AND EVACUATION N/A 04/20/2015   Procedure: DILATATION AND EVACUATION;  Surgeon: JameWoodroe Mode;  Location: [facility];  Service: Gynecology;  Laterality: N/A;   History reviewed. No pertinent family history. Social History   Tobacco Use  . Smoking status: Never Smoker  . Smokeless tobacco: Never Used  Vaping Use  . Vaping Use: Never used  Substance Use Topics  . Alcohol use: No  . Drug use: No   No Known Allergies Current Outpatient Medications on File Prior to Visit  Medication Sig Dispense Refill  . Blood Pressure Monitoring (BLOOD PRESSURE KIT) DEVI 1 Device by Does not apply route as  needed. 1 each 0  . Doxylamine-Pyridoxine 10-10 MG TBEC Take 2 tablets by mouth at bedtime. May also take 1 tab in am and 1 tab in afternoon 100 tablet 1  . Prenat-Fe Poly-Methfol-FA-DHA (VITAFOL ULTRA) 29-0.6-0.4-200 MG CAPS Take 1 capsule by mouth daily. 30 capsule 11  . Prenatal Multivit-Min-Fe-FA (PRENATAL VITAMINS) 0.8 MG tablet Take 1 tablet by mouth daily. (Patient not taking: Reported on 12/17/2018) 30 tablet 12   No current facility-administered medications on file prior to visit.    Review of Systems Pertinent items noted in HPI and remainder of comprehensive ROS otherwise negative. Physical Exam:   Vitals:   12/09/19 1012  BP: 107/70  Pulse: 82  Weight: 177 lb 3.2 oz (80.4 kg)   Fetal Heart Rate (bpm): 156  Uterus:     Pelvic Exam: Perineum: no hemorrhoids, normal perineum   Vulva: normal external genitalia, no lesions   Vagina:  normal mucosa, normal discharge   Cervix: Not examined   Adnexa: Not examined   Bony Pelvis: Not examined  System: General: well-developed, well-nourished female  in no acute distress   Breasts:  normal appearance, no masses or tenderness bilaterally   Skin: normal coloration and turgor, no rashes   Neurologic: oriented, normal, negative, normal mood   Extremities: normal strength, tone, and muscle mass, ROM of all joints is normal   HEENT PERRLA, extraocular movement intact and sclera clear, anicteric   Mouth/Teeth mucous membranes moist, pharynx normal without lesions and dental hygiene good   Neck supple and no masses   Cardiovascular: regular rate and rhythm   Respiratory:  no respiratory distress, normal breath sounds   Abdomen: soft, non-tender; bowel sounds normal; no masses,  no organomegaly    Assessment:    Pregnancy: [id number] Patient Active Problem List   Diagnosis Date Noted  . Supervision of low-risk pregnancy 10/30/2019  . Obesity affecting pregnancy 10/30/2019     Plan:    1. Encounter for supervision of low-risk  pregnancy in second trimester -Taking PNV, concerns as noted below -Discuss flu/COVID vaccination at next appt - CBC/D/Plt+RPR+Rh+ABO+Rub Ab... - Culture, OB Urine - Genetic Screening - Hemoglobin A1c - Cervicovaginal ancillary only( [place]) - AFP, Serum, Open Spina Bifida - aspirin EC 81 MG tablet; Take 1 tablet (81 mg total) by mouth daily. Swallow whole.  Dispense: 30 tablet; Refill: 9  2. Obesity affecting pregnancy, antepartum - aspirin EC 81 MG tablet; Take 1 tablet (81 mg total) by mouth daily. Swallow whole.  Dispense: 30 tablet; Refill: 9  3. Vaginal irritation -Symptoms x1, suspect likely due to new body wash -gcc/wet prep and urine culture completed today to rule out infection -external genitalia normal appearing  4. History of hemorrhoids  -Patient denies any issues currently but would like a refill of proctofoam as this has worked well for her in pregnancy in the past, rx sent to pharmacy  Initial labs drawn. Continue prenatal vitamins. Problem list reviewed and updated. Genetic Screening discussed, AFP, Quad screen and NIPS: ordered. Ultrasound discussed; fetal anatomic survey: already scheduled. Anticipatory guidance about prenatal visits given including labs, ultrasounds, and testing. Discussed usage of Babyscripts and virtual visits as additional source of managing and completing prenatal visits in midst of coronavirus and pandemic.   Encouraged to complete MyChart Registration for her ability to review results, send requests, and have questions addressed.  The nature of [facility] with multiple MDs and Advanced Practice Providers was explained to patient; also emphasized that residents, students are part of our team. Routine obstetric precautions reviewed. Encouraged to seek out care at office or emergency room [facility] MAU preferred) for urgent and/or emergent concerns. No follow-ups on file.     Arrie Senate, MD OB Fellow, [place] [facility] [place] 12/09/2019 10:47 AM

## 2019-12-09 NOTE — Patient Instructions (Signed)
-  take baby aspirin daily (81 mg), prescription sent to pharmacy, if over the counter is cheaper that is fine! -proctofoam hemorrhoid foam sent to pharmacy as well

## 2019-12-10 ENCOUNTER — Encounter: Payer: Self-pay | Admitting: *Deleted

## 2019-12-10 LAB — CERVICOVAGINAL ANCILLARY ONLY
Bacterial Vaginitis (gardnerella): POSITIVE — AB
Candida Glabrata: NEGATIVE
Candida Vaginitis: POSITIVE — AB
Chlamydia: NEGATIVE
Comment: NEGATIVE
Comment: NEGATIVE
Comment: NEGATIVE
Comment: NEGATIVE
Comment: NEGATIVE
Comment: NORMAL
Neisseria Gonorrhea: NEGATIVE
Trichomonas: NEGATIVE

## 2019-12-11 ENCOUNTER — Other Ambulatory Visit: Payer: Self-pay | Admitting: Family Medicine

## 2019-12-11 DIAGNOSIS — B9689 Other specified bacterial agents as the cause of diseases classified elsewhere: Secondary | ICD-10-CM

## 2019-12-11 DIAGNOSIS — B3731 Acute candidiasis of vulva and vagina: Secondary | ICD-10-CM

## 2019-12-11 DIAGNOSIS — B373 Candidiasis of vulva and vagina: Secondary | ICD-10-CM

## 2019-12-11 LAB — AFP, SERUM, OPEN SPINA BIFIDA
AFP MoM: 0.99
AFP Value: 39.9 ng/mL
Gest. Age on Collection Date: 18 weeks
Maternal Age At EDD: 32.1 yr
OSBR Risk 1 IN: 10000
Test Results:: NEGATIVE
Weight: 177 [lb_av]

## 2019-12-11 LAB — CBC/D/PLT+RPR+RH+ABO+RUB AB...
Antibody Screen: NEGATIVE
Basophils Absolute: 0 10*3/uL (ref 0.0–0.2)
Basos: 0 %
EOS (ABSOLUTE): 0.2 10*3/uL (ref 0.0–0.4)
Eos: 1 %
HCV Ab: 0.2 s/co ratio (ref 0.0–0.9)
HIV Screen 4th Generation wRfx: NONREACTIVE
Hematocrit: 32.6 % — ABNORMAL LOW (ref 34.0–46.6)
Hemoglobin: 11.2 g/dL (ref 11.1–15.9)
Hepatitis B Surface Ag: NEGATIVE
Immature Grans (Abs): 0 10*3/uL (ref 0.0–0.1)
Immature Granulocytes: 0 %
Lymphocytes Absolute: 2 10*3/uL (ref 0.7–3.1)
Lymphs: 19 %
MCH: 29.2 pg (ref 26.6–33.0)
MCHC: 34.4 g/dL (ref 31.5–35.7)
MCV: 85 fL (ref 79–97)
Monocytes Absolute: 0.8 10*3/uL (ref 0.1–0.9)
Monocytes: 7 %
Neutrophils Absolute: 7.8 10*3/uL — ABNORMAL HIGH (ref 1.4–7.0)
Neutrophils: 73 %
Platelets: 259 10*3/uL (ref 150–450)
RBC: 3.84 x10E6/uL (ref 3.77–5.28)
RDW: 12.4 % (ref 11.7–15.4)
RPR Ser Ql: NONREACTIVE
Rh Factor: POSITIVE
Rubella Antibodies, IGG: 6.27 index (ref >0.99)
WBC: 10.8 10*3/uL (ref 3.4–10.8)

## 2019-12-11 LAB — HCV INTERPRETATION

## 2019-12-11 LAB — HEMOGLOBIN A1C
Est. average glucose Bld gHb Est-mCnc: 114 mg/dL
Hgb A1c MFr Bld: 5.6 % (ref 4.8–5.6)

## 2019-12-11 LAB — CULTURE, OB URINE

## 2019-12-11 LAB — URINE CULTURE, OB REFLEX

## 2019-12-11 MED ORDER — FLUCONAZOLE 150 MG PO TABS: 150.0000 mg | ORAL_TABLET | Freq: Once | ORAL | 0 refills | Status: AC

## 2019-12-11 MED ORDER — METRONIDAZOLE 500 MG PO TABS: 500.0000 mg | ORAL_TABLET | Freq: Two times a day (BID) | ORAL | 0 refills | Status: AC

## 2019-12-16 ENCOUNTER — Other Ambulatory Visit: Payer: Self-pay | Admitting: *Deleted

## 2019-12-16 ENCOUNTER — Encounter: Payer: Self-pay | Admitting: Family Medicine

## 2019-12-16 ENCOUNTER — Ambulatory Visit: Payer: Medicaid Other | Attending: Family Medicine

## 2019-12-16 ENCOUNTER — Other Ambulatory Visit: Payer: Self-pay

## 2019-12-16 DIAGNOSIS — Z3490 Encounter for supervision of normal pregnancy, unspecified, unspecified trimester: Secondary | ICD-10-CM

## 2019-12-16 DIAGNOSIS — O99210 Obesity complicating pregnancy, unspecified trimester: Secondary | ICD-10-CM

## 2019-12-16 DIAGNOSIS — O4440 Low lying placenta NOS or without hemorrhage, unspecified trimester: Secondary | ICD-10-CM

## 2020-01-06 ENCOUNTER — Telehealth (INDEPENDENT_AMBULATORY_CARE_PROVIDER_SITE_OTHER): Payer: Medicaid Other | Admitting: Student

## 2020-01-06 DIAGNOSIS — Z3A22 22 weeks gestation of pregnancy: Secondary | ICD-10-CM

## 2020-01-06 DIAGNOSIS — O26892 Other specified pregnancy related conditions, second trimester: Secondary | ICD-10-CM

## 2020-01-06 DIAGNOSIS — Z3492 Encounter for supervision of normal pregnancy, unspecified, second trimester: Secondary | ICD-10-CM

## 2020-01-06 DIAGNOSIS — O4440 Low lying placenta NOS or without hemorrhage, unspecified trimester: Secondary | ICD-10-CM

## 2020-01-06 DIAGNOSIS — O2242 Hemorrhoids in pregnancy, second trimester: Secondary | ICD-10-CM

## 2020-01-06 DIAGNOSIS — O4442 Low lying placenta NOS or without hemorrhage, second trimester: Secondary | ICD-10-CM

## 2020-01-06 DIAGNOSIS — M549 Dorsalgia, unspecified: Secondary | ICD-10-CM

## 2020-01-06 MED ORDER — BLOOD PRESSURE KIT DEVI
1.0000 | Freq: Every day | 0 refills | Status: DC
Start: 2020-01-06 — End: 2020-06-04

## 2020-01-06 NOTE — Patient Instructions (Signed)

## 2020-01-06 NOTE — Progress Notes (Signed)
I connected with  Paula Stark on 01/06/20 at  9:15 AM EDT by telephone and verified that I am speaking with the correct person using two identifiers.   I discussed the limitations, risks, security and privacy concerns of performing an evaluation and management service by telephone and the availability of in person appointments. I also discussed with the patient that there may be a patient responsible charge related to this service. The patient expressed understanding and agreed to proceed.  Janene Madeira Maylea Soria, CMA 01/06/2020  9:25 AM
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
## 2020-01-07 ENCOUNTER — Encounter: Payer: Self-pay | Admitting: *Deleted

## 2020-01-13 ENCOUNTER — Other Ambulatory Visit: Payer: Self-pay

## 2020-01-13 ENCOUNTER — Other Ambulatory Visit: Payer: Medicaid Other

## 2020-01-13 ENCOUNTER — Ambulatory Visit: Payer: Medicaid Other | Attending: Obstetrics and Gynecology

## 2020-01-13 DIAGNOSIS — Z3A23 23 weeks gestation of pregnancy: Secondary | ICD-10-CM

## 2020-01-13 DIAGNOSIS — Z3492 Encounter for supervision of normal pregnancy, unspecified, second trimester: Secondary | ICD-10-CM

## 2020-01-13 DIAGNOSIS — O4440 Low lying placenta NOS or without hemorrhage, unspecified trimester: Secondary | ICD-10-CM | POA: Insufficient documentation

## 2020-01-13 DIAGNOSIS — O4442 Low lying placenta NOS or without hemorrhage, second trimester: Secondary | ICD-10-CM | POA: Diagnosis not present

## 2020-01-13 DIAGNOSIS — Z362 Encounter for other antenatal screening follow-up: Secondary | ICD-10-CM

## 2020-01-13 MED ORDER — COMFORT FIT MATERNITY SUPP MED MISC: 1.0000 | Freq: Once | 0 refills | Status: AC

## 2020-01-13 NOTE — Patient Instructions (Addendum)
Blood Pressure Cuff  [facility] [address] [place], [place] [address] [phone]      PREGNANCY SUPPORT BELT: You are not alone, Seventy-five percent of women have some sort of abdominal or back pain at some point in their pregnancy. Your baby is growing at a fast pace, which means that your whole body is rapidly trying to adjust to the changes. As your uterus grows, your back may start feeling a bit under stress and this can result in back or abdominal pain that can go from mild, and therefore bearable, to severe pains that will not allow you to sit or lay down comfortably, When it comes to dealing with pregnancy-related pains and cramps, some pregnant women usually prefer natural remedies, which the market is filled with nowadays. For example, wearing a pregnancy support belt can help ease and lessen your discomfort and pain. WHAT ARE THE BENEFITS OF WEARING A PREGNANCY SUPPORT BELT? A pregnancy support belt provides support to the lower portion of the belly taking some of the weight of the growing uterus and distributing to the other parts of your body. It is designed make you comfortable and gives you extra support. Over the years, the pregnancy apparel market has been studying the needs and wants of pregnant women and they have come up with the most comfortable pregnancy support belts that woman could ever ask for. In fact, you will no longer have to wear a stretched-out or bulky pregnancy belt that is visible underneath your clothes and makes you feel even more uncomfortable. Nowadays, a pregnancy support belt is made of comfortable and stretchy materials that will not irritate your skin but will actually make you feel at ease and you will not even notice you are wearing it. They are easy to put on and adjust during the day and can be worn at night for additional support.  BENEFITS: . Relives Back pain . Relieves Abdominal Muscle and Leg Pain . Stabilizes the Pelvic  Ring . Offers a Cushioned Abdominal Lift Pad . Relieves pressure on the Sciatic Nerve Within Minutes WHERE TO GET YOUR PREGNANCY BELT: [employer] [phone] @2301  [address] [place], [place]

## 2020-01-13 NOTE — Progress Notes (Unsigned)
Pt here today for urine culture and printed rx of pregnancy support belt per Judeth Horn, NP. Instructions for [employer] included in AVS.  Fleet Contras RN

## 2020-01-15 LAB — URINE CULTURE, OB REFLEX

## 2020-01-15 LAB — CULTURE, OB URINE

## 2020-01-21 ENCOUNTER — Encounter: Payer: Self-pay | Admitting: *Deleted

## 2020-02-03 ENCOUNTER — Ambulatory Visit (INDEPENDENT_AMBULATORY_CARE_PROVIDER_SITE_OTHER): Payer: Medicaid Other | Admitting: Nurse Practitioner

## 2020-02-03 ENCOUNTER — Other Ambulatory Visit: Payer: Self-pay

## 2020-02-03 VITALS — BP 107/66 | HR 82 | Wt 180.0 lb

## 2020-02-03 DIAGNOSIS — Z3492 Encounter for supervision of normal pregnancy, unspecified, second trimester: Secondary | ICD-10-CM | POA: Diagnosis not present

## 2020-02-03 DIAGNOSIS — Z23 Encounter for immunization: Secondary | ICD-10-CM

## 2020-02-03 DIAGNOSIS — O99210 Obesity complicating pregnancy, unspecified trimester: Secondary | ICD-10-CM

## 2020-02-03 DIAGNOSIS — O2242 Hemorrhoids in pregnancy, second trimester: Secondary | ICD-10-CM

## 2020-02-03 DIAGNOSIS — K649 Unspecified hemorrhoids: Secondary | ICD-10-CM | POA: Insufficient documentation

## 2020-02-03 MED ORDER — DOCUSATE CALCIUM 240 MG PO CAPS
240.0000 mg | ORAL_CAPSULE | Freq: Every day | ORAL | 2 refills | Status: DC
Start: 2020-02-03 — End: 2020-02-17

## 2020-02-03 MED ORDER — HYDROCORTISONE ACETATE 25 MG RE SUPP
25.0000 mg | Freq: Two times a day (BID) | RECTAL | 0 refills | Status: DC
Start: 2020-02-03 — End: 2020-03-03

## 2020-02-03 NOTE — Progress Notes (Signed)
    Subjective:  Paula Stark is a 29 y.o. [id number] at [redacted]w[redacted]d being seen today for ongoing prenatal care.  She is currently monitored for the following issues for this low-risk pregnancy and has Supervision of low-risk pregnancy; Obesity affecting pregnancy; and Hemorrhoids during pregnancy in second trimester on their problem list.  Patient reports hemorrhoids are painful at times.  .  Contractions: Not present. Vag. Bleeding: None.  Movement: Present. Denies leaking of fluid.   The following portions of the patient's history were reviewed and updated as appropriate: allergies, current medications, past family history, past medical history, past social history, past surgical history and problem list. Problem list updated.  Objective:   Vitals:   02/03/20 0949  BP: 107/66  Pulse: 82  Weight: 180 lb (81.6 kg)    Fetal Status: Fetal Heart Rate (bpm): 140 Fundal Height: 25 cm Movement: Present     General:  Alert, oriented and cooperative. Patient is in no acute distress.  Skin: Skin is warm and dry. No rash noted.   Cardiovascular: Normal heart rate noted  Respiratory: Normal respiratory effort, no problems with respiration noted  Abdomen: Soft, gravid, appropriate for gestational age. Pain/Pressure: Present     Pelvic:  Cervical exam deferred        Extremities: Normal range of motion.  Edema: None  Mental Status: Normal mood and affect. Normal behavior. Normal judgment and thought content.   Urinalysis:      Assessment and Plan:  Pregnancy: G5P3013 at [redacted]w[redacted]d  1. Encounter for supervision of low-risk pregnancy in second trimester Will return in 2 weeks for 2 hr glucola  - CBC; Future - Glucose Tolerance, 2 Hours w/1 Hour; Future - HIV Antibody (routine testing w rflx); Future - RPR; Future - Tdap vaccine greater than or equal to 7yo IM - RPR - HIV Antibody (routine testing w rflx) - Glucose Tolerance, 2 Hours w/1 Hour - CBC  2. Hemorrhoids during pregnancy in second trimester  Using proctofoam occasionally.  Will try suppositories to see if they give more relief and prescribed stool softener so stools are soft and daily to decrease pain.  3.  Obesity Taking low dose aspirin  Preterm labor symptoms and general obstetric precautions including but not limited to vaginal bleeding, contractions, leaking of fluid and fetal movement were reviewed in detail with the patient. Please refer to After Visit Summary for other counseling recommendations.  Return in about 2 weeks (around 02/17/2020) for needs early AM appointment for fasting 2 hr glucola.  Nolene Bernheim, RN, MSN, NP-BC Nurse Practitioner, [facility] for [employer], [facility] 02/03/2020 1:08 PM

## 2020-02-03 NOTE — Patient Instructions (Signed)

## 2020-02-10 ENCOUNTER — Other Ambulatory Visit: Payer: Self-pay | Admitting: *Deleted

## 2020-02-10 DIAGNOSIS — Z3490 Encounter for supervision of normal pregnancy, unspecified, unspecified trimester: Secondary | ICD-10-CM

## 2020-02-17 ENCOUNTER — Other Ambulatory Visit: Payer: Self-pay

## 2020-02-17 ENCOUNTER — Other Ambulatory Visit: Payer: Medicaid Other

## 2020-02-17 ENCOUNTER — Ambulatory Visit (INDEPENDENT_AMBULATORY_CARE_PROVIDER_SITE_OTHER): Payer: Medicaid Other | Admitting: Family Medicine

## 2020-02-17 ENCOUNTER — Encounter: Payer: Self-pay | Admitting: Family Medicine

## 2020-02-17 VITALS — BP 102/66 | HR 76 | Wt 183.0 lb

## 2020-02-17 DIAGNOSIS — Z3490 Encounter for supervision of normal pregnancy, unspecified, unspecified trimester: Secondary | ICD-10-CM | POA: Diagnosis not present

## 2020-02-17 DIAGNOSIS — O2242 Hemorrhoids in pregnancy, second trimester: Secondary | ICD-10-CM

## 2020-02-17 DIAGNOSIS — Z3493 Encounter for supervision of normal pregnancy, unspecified, third trimester: Secondary | ICD-10-CM

## 2020-02-17 DIAGNOSIS — O99210 Obesity complicating pregnancy, unspecified trimester: Secondary | ICD-10-CM

## 2020-02-17 MED ORDER — POLYETHYLENE GLYCOL 3350 17 GM/SCOOP PO POWD: 17.0000 g | Freq: Every day | ORAL | 1 refills | Status: DC | PRN

## 2020-02-17 NOTE — Progress Notes (Signed)
   Subjective:  Paula Stark is a 29 y.o. [id number] at [redacted]w[redacted]d being seen today for ongoing prenatal care.  She is currently monitored for the following issues for this low-risk pregnancy and has Supervision of low-risk pregnancy; Obesity affecting pregnancy; and Hemorrhoids during pregnancy in second trimester on their problem list.  Patient reports hemorrhoids.  Contractions: Not present. Vag. Bleeding: None.  Movement: Present. Denies leaking of fluid.   The following portions of the patient's history were reviewed and updated as appropriate: allergies, current medications, past family history, past medical history, past social history, past surgical history and problem list. Problem list updated.  Objective:   Vitals:   02/17/20 0844  BP: 102/66  Pulse: 76  Weight: 183 lb (83 kg)    Fetal Status: Fetal Heart Rate (bpm): 145   Movement: Present     General:  Alert, oriented and cooperative. Patient is in no acute distress.  Skin: Skin is warm and dry. No rash noted.   Cardiovascular: Normal heart rate noted  Respiratory: Normal respiratory effort, no problems with respiration noted  Abdomen: Soft, gravid, appropriate for gestational age. Pain/Pressure: Absent     Pelvic: Vag. Bleeding: None     Cervical exam deferred        Extremities: Normal range of motion.  Edema: None  Mental Status: Normal mood and affect. Normal behavior. Normal judgment and thought content.   Urinalysis:      Assessment and Plan:  Pregnancy: G5P3013 at [redacted]w[redacted]d  1. Encounter for supervision of low-risk pregnancy in third trimester BP and FHR normal Tdap today 28wk labs today Still deciding between nexplanon (has had twice before) and IUD, discussed possible post placental placement Some constipation and hemorrhoids, miralax sent to pharmacy  2. Obesity affecting pregnancy, antepartum   3. Hemorrhoids during pregnancy in second trimester   Preterm labor symptoms and general obstetric precautions  including but not limited to vaginal bleeding, contractions, leaking of fluid and fetal movement were reviewed in detail with the patient. Please refer to After Visit Summary for other counseling recommendations.  Return in 2 weeks (on 03/02/2020) for [facility], ob visit.   Venora Maples, MD

## 2020-02-17 NOTE — Patient Instructions (Signed)
 Third Trimester of Pregnancy The third trimester is from week 28 through week 40 (months 7 through 9). The third trimester is a time when the unborn baby (fetus) is growing rapidly. At the end of the ninth month, the fetus is about 20 inches in length and weighs 6-10 pounds. Body changes during your third trimester Your body will continue to go through many changes during pregnancy. The changes vary from woman to woman. During the third trimester:  Your weight will continue to increase. You can expect to gain 25-35 pounds (11-16 kg) by the end of the pregnancy.  You may begin to get stretch marks on your hips, abdomen, and breasts.  You may urinate more often because the fetus is moving lower into your pelvis and pressing on your bladder.  You may develop or continue to have heartburn. This is caused by increased hormones that slow down muscles in the digestive tract.  You may develop or continue to have constipation because increased hormones slow digestion and cause the muscles that push waste through your intestines to relax.  You may develop hemorrhoids. These are swollen veins (varicose veins) in the rectum that can itch or be painful.  You may develop swollen, bulging veins (varicose veins) in your legs.  You may have increased body aches in the pelvis, back, or thighs. This is due to weight gain and increased hormones that are relaxing your joints.  You may have changes in your hair. These can include thickening of your hair, rapid growth, and changes in texture. Some women also have hair loss during or after pregnancy, or hair that feels dry or thin. Your hair will most likely return to normal after your baby is born.  Your breasts will continue to grow and they will continue to become tender. A yellow fluid (colostrum) may leak from your breasts. This is the first milk you are producing for your baby.  Your belly button may stick out.  You may notice more swelling in your  hands, face, or ankles.  You may have increased tingling or numbness in your hands, arms, and legs. The skin on your belly may also feel numb.  You may feel short of breath because of your expanding uterus.  You may have more problems sleeping. This can be caused by the size of your belly, increased need to urinate, and an increase in your body's metabolism.  You may notice the fetus "dropping," or moving lower in your abdomen (lightening).  You may have increased vaginal discharge.  You may notice your joints feel loose and you may have pain around your pelvic bone. What to expect at prenatal visits You will have prenatal exams every 2 weeks until week 36. Then you will have weekly prenatal exams. During a routine prenatal visit:  You will be weighed to make sure you and the baby are growing normally.  Your blood pressure will be taken.  Your abdomen will be measured to track your baby's growth.  The fetal heartbeat will be listened to.  Any test results from the previous visit will be discussed.  You may have a cervical check near your due date to see if your cervix has softened or thinned (effaced).  You will be tested for Group B streptococcus. This happens between 35 and 37 weeks. Your health care provider may ask you:  What your birth plan is.  How you are feeling.  If you are feeling the baby move.  If you have had any   abnormal symptoms, such as leaking fluid, bleeding, severe headaches, or abdominal cramping.  If you are using any tobacco products, including cigarettes, chewing tobacco, and electronic cigarettes.  If you have any questions. Other tests or screenings that may be performed during your third trimester include:  Blood tests that check for low iron levels (anemia).  Fetal testing to check the health, activity level, and growth of the fetus. Testing is done if you have certain medical conditions or if there are problems during the  pregnancy.  Nonstress test (NST). This test checks the health of your baby to make sure there are no signs of problems, such as the baby not getting enough oxygen. During this test, a belt is placed around your belly. The baby is made to move, and its heart rate is monitored during movement. What is false labor? False labor is a condition in which you feel small, irregular tightenings of the muscles in the womb (contractions) that usually go away with rest, changing position, or drinking water. These are called Braxton Hicks contractions. Contractions may last for hours, days, or even weeks before true labor sets in. If contractions come at regular intervals, become more frequent, increase in intensity, or become painful, you should see your health care provider. What are the signs of labor?  Abdominal cramps.  Regular contractions that start at 10 minutes apart and become stronger and more frequent with time.  Contractions that start on the top of the uterus and spread down to the lower abdomen and back.  Increased pelvic pressure and dull back pain.  A watery or bloody mucus discharge that comes from the vagina.  Leaking of amniotic fluid. This is also known as your "water breaking." It could be a slow trickle or a gush. Let your health care provider know if it has a color or strange odor. If you have any of these signs, call your health care provider right away, even if it is before your due date. Follow these instructions at home: Medicines  Follow your health care provider's instructions regarding medicine use. Specific medicines may be either safe or unsafe to take during pregnancy.  Take a prenatal vitamin that contains at least 600 micrograms (mcg) of folic acid.  If you develop constipation, try taking a stool softener if your health care provider approves. Eating and drinking   Eat a balanced diet that includes fresh fruits and vegetables, whole grains, good sources of protein  such as meat, eggs, or tofu, and low-fat dairy. Your health care provider will help you determine the amount of weight gain that is right for you.  Avoid raw meat and uncooked cheese. These carry germs that can cause birth defects in the baby.  If you have low calcium intake from food, talk to your health care provider about whether you should take a daily calcium supplement.  Eat four or five small meals rather than three large meals a day.  Limit foods that are high in fat and processed sugars, such as fried and sweet foods.  To prevent constipation: ? Drink enough fluid to keep your urine clear or pale yellow. ? Eat foods that are high in fiber, such as fresh fruits and vegetables, whole grains, and beans. Activity  Exercise only as directed by your health care provider. Most women can continue their usual exercise routine during pregnancy. Try to exercise for 30 minutes at least 5 days a week. Stop exercising if you experience uterine contractions.  Avoid heavy lifting.    Do not exercise in extreme heat or humidity, or at high altitudes.  Wear low-heel, comfortable shoes.  Practice good posture.  You may continue to have sex unless your health care provider tells you otherwise. Relieving pain and discomfort  Take frequent breaks and rest with your legs elevated if you have leg cramps or low back pain.  Take warm sitz baths to soothe any pain or discomfort caused by hemorrhoids. Use hemorrhoid cream if your health care provider approves.  Wear a good support bra to prevent discomfort from breast tenderness.  If you develop varicose veins: ? Wear support pantyhose or compression stockings as told by your healthcare provider. ? Elevate your feet for 15 minutes, 3-4 times a day. Prenatal care  Write down your questions. Take them to your prenatal visits.  Keep all your prenatal visits as told by your health care provider. This is important. Safety  Wear your seat belt at  all times when driving.  Make a list of emergency phone numbers, including numbers for family, friends, the hospital, and police and fire departments. General instructions  Avoid cat litter boxes and soil used by cats. These carry germs that can cause birth defects in the baby. If you have a cat, ask someone to clean the litter box for you.  Do not travel far distances unless it is absolutely necessary and only with the approval of your health care provider.  Do not use hot tubs, steam rooms, or saunas.  Do not drink alcohol.  Do not use any products that contain nicotine or tobacco, such as cigarettes and e-cigarettes. If you need help quitting, ask your health care provider.  Do not use any medicinal herbs or unprescribed drugs. These chemicals affect the formation and growth of the baby.  Do not douche or use tampons or scented sanitary pads.  Do not cross your legs for long periods of time.  To prepare for the arrival of your baby: ? Take prenatal classes to understand, practice, and ask questions about labor and delivery. ? Make a trial run to the hospital. ? Visit the hospital and tour the maternity area. ? Arrange for maternity or paternity leave through employers. ? Arrange for family and friends to take care of pets while you are in the hospital. ? Purchase a rear-facing car seat and make sure you know how to install it in your car. ? Pack your hospital bag. ? Prepare the baby's nursery. Make sure to remove all pillows and stuffed animals from the baby's crib to prevent suffocation.  Visit your dentist if you have not gone during your pregnancy. Use a soft toothbrush to brush your teeth and be gentle when you floss. Contact a health care provider if:  You are unsure if you are in labor or if your water has broken.  You become dizzy.  You have mild pelvic cramps, pelvic pressure, or nagging pain in your abdominal area.  You have lower back pain.  You have persistent  nausea, vomiting, or diarrhea.  You have an unusual or bad smelling vaginal discharge.  You have pain when you urinate. Get help right away if:  Your water breaks before 37 weeks.  You have regular contractions less than 5 minutes apart before 37 weeks.  You have a fever.  You are leaking fluid from your vagina.  You have spotting or bleeding from your vagina.  You have severe abdominal pain or cramping.  You have rapid weight loss or weight gain.  You   have shortness of breath with chest pain.  You notice sudden or extreme swelling of your face, hands, ankles, feet, or legs.  Your baby makes fewer than 10 movements in 2 hours.  You have severe headaches that do not go away when you take medicine.  You have vision changes. Summary  The third trimester is from week 28 through week 40, months 7 through 9. The third trimester is a time when the unborn baby (fetus) is growing rapidly.  During the third trimester, your discomfort may increase as you and your baby continue to gain weight. You may have abdominal, leg, and back pain, sleeping problems, and an increased need to urinate.  During the third trimester your breasts will keep growing and they will continue to become tender. A yellow fluid (colostrum) may leak from your breasts. This is the first milk you are producing for your baby.  False labor is a condition in which you feel small, irregular tightenings of the muscles in the womb (contractions) that eventually go away. These are called Braxton Hicks contractions. Contractions may last for hours, days, or even weeks before true labor sets in.  Signs of labor can include: abdominal cramps; regular contractions that start at 10 minutes apart and become stronger and more frequent with time; watery or bloody mucus discharge that comes from the vagina; increased pelvic pressure and dull back pain; and leaking of amniotic fluid. This information is not intended to replace advice  given to you by your health care provider. Make sure you discuss any questions you have with your health care provider. Document Revised: 06/27/2018 Document Reviewed: 04/11/2016 Else[employer] Patient Education  2020 [employer]   Contraception Choices Contraception, also called birth control, refers to methods or devices that prevent pregnancy. Hormonal methods Contraceptive implant  A contraceptive implant is a thin, plastic tube that contains a hormone. It is inserted into the upper part of the arm. It can remain in place for up to 3 years. Progestin-only injections Progestin-only injections are injections of progestin, a synthetic form of the hormone progesterone. They are given every 3 months by a health care provider. Birth control pills  Birth control pills are pills that contain hormones that prevent pregnancy. They must be taken once a day, preferably at the same time each day. Birth control patch  The birth control patch contains hormones that prevent pregnancy. It is placed on the skin and must be changed once a week for three weeks and removed on the fourth week. A prescription is needed to use this method of contraception. Vaginal ring  A vaginal ring contains hormones that prevent pregnancy. It is placed in the vagina for three weeks and removed on the fourth week. After that, the process is repeated with a new ring. A prescription is needed to use this method of contraception. Emergency contraceptive Emergency contraceptives prevent pregnancy after unprotected sex. They come in pill form and can be taken up to 5 days after sex. They work best the sooner they are taken after having sex. Most emergency contraceptives are available without a prescription. This method should not be used as your only form of birth control. Barrier methods Female condom  A female condom is a thin sheath that is worn over the penis during sex. Condoms keep sperm from going inside a woman's body. They can  be used with a spermicide to increase their effectiveness. They should be disposed after a single use. Female condom  A female condom is a soft,   loose-fitting sheath that is put into the vagina before sex. The condom keeps sperm from going inside a woman's body. They should be disposed after a single use. Diaphragm  A diaphragm is a soft, dome-shaped barrier. It is inserted into the vagina before sex, along with a spermicide. The diaphragm blocks sperm from entering the uterus, and the spermicide kills sperm. A diaphragm should be left in the vagina for 6-8 hours after sex and removed within 24 hours. A diaphragm is prescribed and fitted by a health care provider. A diaphragm should be replaced every 1-2 years, after giving birth, after gaining more than 15 lb (6.8 kg), and after pelvic surgery. Cervical cap  A cervical cap is a round, soft latex or plastic cup that fits over the cervix. It is inserted into the vagina before sex, along with spermicide. It blocks sperm from entering the uterus. The cap should be left in place for 6-8 hours after sex and removed within 48 hours. A cervical cap must be prescribed and fitted by a health care provider. It should be replaced every 2 years. Sponge  A sponge is a soft, circular piece of polyurethane foam with spermicide on it. The sponge helps block sperm from entering the uterus, and the spermicide kills sperm. To use it, you make it wet and then insert it into the vagina. It should be inserted before sex, left in for at least 6 hours after sex, and removed and thrown away within 30 hours. Spermicides Spermicides are chemicals that kill or block sperm from entering the cervix and uterus. They can come as a cream, jelly, suppository, foam, or tablet. A spermicide should be inserted into the vagina with an applicator at least 10-15 minutes before sex to allow time for it to work. The process must be repeated every time you have sex. Spermicides do not require  a prescription. Intrauterine contraception Intrauterine device (IUD) An IUD is a T-shaped device that is put in a woman's uterus. There are two types:  Hormone IUD.This type contains progestin, a synthetic form of the hormone progesterone. This type can stay in place for 3-5 years.  Copper IUD.This type is wrapped in copper wire. It can stay in place for 10 years.  Permanent methods of contraception Female tubal ligation In this method, a woman's fallopian tubes are sealed, tied, or blocked during surgery to prevent eggs from traveling to the uterus. Hysteroscopic sterilization In this method, a small, flexible insert is placed into each fallopian tube. The inserts cause scar tissue to form in the fallopian tubes and block them, so sperm cannot reach an egg. The procedure takes about 3 months to be effective. Another form of birth control must be used during those 3 months. Female sterilization This is a procedure to tie off the tubes that carry sperm (vasectomy). After the procedure, the man can still ejaculate fluid (semen). Natural planning methods Natural family planning In this method, a couple does not have sex on days when the woman could become pregnant. Calendar method This means keeping track of the length of each menstrual cycle, identifying the days when pregnancy can happen, and not having sex on those days. Ovulation method In this method, a couple avoids sex during ovulation. Symptothermal method This method involves not having sex during ovulation. The woman typically checks for ovulation by watching changes in her temperature and in the consistency of cervical mucus. Post-ovulation method In this method, a couple waits to have sex until after ovulation. Summary    Contraception, also called birth control, means methods or devices that prevent pregnancy.  Hormonal methods of contraception include implants, injections, pills, patches, vaginal rings, and emergency  contraceptives.  Barrier methods of contraception can include female condoms, female condoms, diaphragms, cervical caps, sponges, and spermicides.  There are two types of IUDs (intrauterine devices). An IUD can be put in a woman's uterus to prevent pregnancy for 3-5 years.  Permanent sterilization can be done through a procedure for males, females, or both.  Natural family planning methods involve not having sex on days when the woman could become pregnant. This information is not intended to replace advice given to you by your health care provider. Make sure you discuss any questions you have with your health care provider. Document Revised: 03/08/2017 Document Reviewed: 04/08/2016 [employer] Patient Education  2020 [employer]   Breastfeeding  Choosing to breastfeed is one of the best decisions you can make for yourself and your baby. A change in hormones during pregnancy causes your breasts to make breast milk in your milk-producing glands. Hormones prevent breast milk from being released before your baby is born. They also prompt milk flow after birth. Once breastfeeding has begun, thoughts of your baby, as well as his or her sucking or crying, can stimulate the release of milk from your milk-producing glands. Benefits of breastfeeding Research shows that breastfeeding offers many health benefits for infants and mothers. It also offers a cost-free and convenient way to feed your baby. For your baby  Your first milk (colostrum) helps your baby's digestive system to function better.  Special cells in your milk (antibodies) help your baby to fight off infections.  Breastfed babies are less likely to develop asthma, allergies, obesity, or type 2 diabetes. They are also at lower risk for sudden infant death syndrome (SIDS).  Nutrients in breast milk are better able to meet your baby's needs compared to infant formula.  Breast milk improves your baby's brain development. For  you  Breastfeeding helps to create a very special bond between you and your baby.  Breastfeeding is convenient. Breast milk costs nothing and is always available at the correct temperature.  Breastfeeding helps to burn calories. It helps you to lose the weight that you gained during pregnancy.  Breastfeeding makes your uterus return faster to its size before pregnancy. It also slows bleeding (lochia) after you give birth.  Breastfeeding helps to lower your risk of developing type 2 diabetes, osteoporosis, rheumatoid arthritis, cardiovascular disease, and breast, ovarian, uterine, and endometrial cancer later in life. Breastfeeding basics Starting breastfeeding  Find a comfortable place to sit or lie down, with your neck and back well-supported.  Place a pillow or a rolled-up blanket under your baby to bring him or her to the level of your breast (if you are seated). Nursing pillows are specially designed to help support your arms and your baby while you breastfeed.  Make sure that your baby's tummy (abdomen) is facing your abdomen.  Gently massage your breast. With your fingertips, massage from the outer edges of your breast inward toward the nipple. This encourages milk flow. If your milk flows slowly, you may need to continue this action during the feeding.  Support your breast with 4 fingers underneath and your thumb above your nipple (make the letter "C" with your hand). Make sure your fingers are well away from your nipple and your baby's mouth.  Stroke your baby's lips gently with your finger or nipple.  When your baby's mouth is open   wide enough, quickly bring your baby to your breast, placing your entire nipple and as much of the areola as possible into your baby's mouth. The areola is the colored area around your nipple. ? More areola should be visible above your baby's upper lip than below the lower lip. ? Your baby's lips should be opened and extended outward (flanged) to  ensure an adequate, comfortable latch. ? Your baby's tongue should be between his or her lower gum and your breast.  Make sure that your baby's mouth is correctly positioned around your nipple (latched). Your baby's lips should create a seal on your breast and be turned out (everted).  It is common for your baby to suck about 2-3 minutes in order to start the flow of breast milk. Latching Teaching your baby how to latch onto your breast properly is very important. An improper latch can cause nipple pain, decreased milk supply, and poor weight gain in your baby. Also, if your baby is not latched onto your nipple properly, he or she may swallow some air during feeding. This can make your baby fussy. Burping your baby when you switch breasts during the feeding can help to get rid of the air. However, teaching your baby to latch on properly is still the best way to prevent fussiness from swallowing air while breastfeeding. Signs that your baby has successfully latched onto your nipple  Silent tugging or silent sucking, without causing you pain. Infant's lips should be extended outward (flanged).  Swallowing heard between every 3-4 sucks once your milk has started to flow (after your let-down milk reflex occurs).  Muscle movement above and in front of his or her ears while sucking. Signs that your baby has not successfully latched onto your nipple  Sucking sounds or smacking sounds from your baby while breastfeeding.  Nipple pain. If you think your baby has not latched on correctly, slip your finger into the corner of your baby's mouth to break the suction and place it between your baby's gums. Attempt to start breastfeeding again. Signs of successful breastfeeding Signs from your baby  Your baby will gradually decrease the number of sucks or will completely stop sucking.  Your baby will fall asleep.  Your baby's body will relax.  Your baby will retain a small amount of milk in his or her  mouth.  Your baby will let go of your breast by himself or herself. Signs from you  Breasts that have increased in firmness, weight, and size 1-3 hours after feeding.  Breasts that are softer immediately after breastfeeding.  Increased milk volume, as well as a change in milk consistency and color by the fifth day of breastfeeding.  Nipples that are not sore, cracked, or bleeding. Signs that your baby is getting enough milk  Wetting at least 1-2 diapers during the first 24 hours after birth.  Wetting at least 5-6 diapers every 24 hours for the first week after birth. The urine should be clear or pale yellow by the age of 5 days.  Wetting 6-8 diapers every 24 hours as your baby continues to grow and develop.  At least 3 stools in a 24-hour period by the age of 5 days. The stool should be soft and yellow.  At least 3 stools in a 24-hour period by the age of 7 days. The stool should be seedy and yellow.  No loss of weight greater than 10% of birth weight during the first 3 days of life.  Average weight gain   of 4-7 oz (113-198 g) per week after the age of 4 days.  Consistent daily weight gain by the age of 5 days, without weight loss after the age of 2 weeks. After a feeding, your baby may spit up a small amount of milk. This is normal. Breastfeeding frequency and duration Frequent feeding will help you make more milk and can prevent sore nipples and extremely full breasts (breast engorgement). Breastfeed when you feel the need to reduce the fullness of your breasts or when your baby shows signs of hunger. This is called "breastfeeding on demand." Signs that your baby is hungry include:  Increased alertness, activity, or restlessness.  Movement of the head from side to side.  Opening of the mouth when the corner of the mouth or cheek is stroked (rooting).  Increased sucking sounds, smacking lips, cooing, sighing, or squeaking.  Hand-to-mouth movements and sucking on fingers or  hands.  Fussing or crying. Avoid introducing a pacifier to your baby in the first 4-6 weeks after your baby is born. After this time, you may choose to use a pacifier. Research has shown that pacifier use during the first year of a baby's life decreases the risk of sudden infant death syndrome (SIDS). Allow your baby to feed on each breast as long as he or she wants. When your baby unlatches or falls asleep while feeding from the first breast, offer the second breast. Because newborns are often sleepy in the first few weeks of life, you may need to awaken your baby to get him or her to feed. Breastfeeding times will vary from baby to baby. However, the following rules can serve as a guide to help you make sure that your baby is properly fed:  Newborns (babies 4 weeks of age or younger) may breastfeed every 1-3 hours.  Newborns should not go without breastfeeding for longer than 3 hours during the day or 5 hours during the night.  You should breastfeed your baby a minimum of 8 times in a 24-hour period. Breast milk pumping     Pumping and storing breast milk allows you to make sure that your baby is exclusively fed your breast milk, even at times when you are unable to breastfeed. This is especially important if you go back to work while you are still breastfeeding, or if you are not able to be present during feedings. Your lactation consultant can help you find a method of pumping that works best for you and give you guidelines about how long it is safe to store breast milk. Caring for your breasts while you breastfeed Nipples can become dry, cracked, and sore while breastfeeding. The following recommendations can help keep your breasts moisturized and healthy:  Avoid using soap on your nipples.  Wear a supportive bra designed especially for nursing. Avoid wearing underwire-style bras or extremely tight bras (sports bras).  Air-dry your nipples for 3-4 minutes after each feeding.  Use only  cotton bra pads to absorb leaked breast milk. Leaking of breast milk between feedings is normal.  Use lanolin on your nipples after breastfeeding. Lanolin helps to maintain your skin's normal moisture barrier. Pure lanolin is not harmful (not toxic) to your baby. You may also hand express a few drops of breast milk and gently massage that milk into your nipples and allow the milk to air-dry. In the first few weeks after giving birth, some women experience breast engorgement. Engorgement can make your breasts feel heavy, warm, and tender to the touch. Engorgement   peaks within 3-5 days after you give birth. The following recommendations can help to ease engorgement:  Completely empty your breasts while breastfeeding or pumping. You may want to start by applying warm, moist heat (in the shower or with warm, water-soaked hand towels) just before feeding or pumping. This increases circulation and helps the milk flow. If your baby does not completely empty your breasts while breastfeeding, pump any extra milk after he or she is finished.  Apply ice packs to your breasts immediately after breastfeeding or pumping, unless this is too uncomfortable for you. To do this: ? Put ice in a plastic bag. ? Place a towel between your skin and the bag. ? Leave the ice on for 20 minutes, 2-3 times a day.  Make sure that your baby is latched on and positioned properly while breastfeeding. If engorgement persists after 48 hours of following these recommendations, contact your health care provider or a lactation consultant. Overall health care recommendations while breastfeeding  Eat 3 healthy meals and 3 snacks every day. Well-nourished mothers who are breastfeeding need an additional 450-500 calories a day. You can meet this requirement by increasing the amount of a balanced diet that you eat.  Drink enough water to keep your urine pale yellow or clear.  Rest often, relax, and continue to take your prenatal vitamins  to prevent fatigue, stress, and low vitamin and mineral levels in your body (nutrient deficiencies).  Do not use any products that contain nicotine or tobacco, such as cigarettes and e-cigarettes. Your baby may be harmed by chemicals from cigarettes that pass into breast milk and exposure to secondhand smoke. If you need help quitting, ask your health care provider.  Avoid alcohol.  Do not use illegal drugs or marijuana.  Talk with your health care provider before taking any medicines. These include over-the-counter and prescription medicines as well as vitamins and herbal supplements. Some medicines that may be harmful to your baby can pass through breast milk.  It is possible to become pregnant while breastfeeding. If birth control is desired, ask your health care provider about options that will be safe while breastfeeding your baby. Where to find more information: [employer]: www.llli.org Contact a health care provider if:  You feel like you want to stop breastfeeding or have become frustrated with breastfeeding.  Your nipples are cracked or bleeding.  Your breasts are red, tender, or warm.  You have: ? Painful breasts or nipples. ? A swollen area on either breast. ? A fever or chills. ? Nausea or vomiting. ? Drainage other than breast milk from your nipples.  Your breasts do not become full before feedings by the fifth day after you give birth.  You feel sad and depressed.  Your baby is: ? Too sleepy to eat well. ? Having trouble sleeping. ? More than 1 week old and wetting fewer than 6 diapers in a 24-hour period. ? Not gaining weight by 5 days of age.  Your baby has fewer than 3 stools in a 24-hour period.  Your baby's skin or the white parts of his or her eyes become yellow. Get help right away if:  Your baby is overly tired (lethargic) and does not want to wake up and feed.  Your baby develops an unexplained fever. Summary  Breastfeeding  offers many health benefits for infant and mothers.  Try to breastfeed your infant when he or she shows early signs of hunger.  Gently tickle or stroke your baby's lips with   your finger or nipple to allow the baby to open his or her mouth. Bring the baby to your breast. Make sure that much of the areola is in your baby's mouth. Offer one side and burp the baby before you offer the other side.  Talk with your health care provider or lactation consultant if you have questions or you face problems as you breastfeed. This information is not intended to replace advice given to you by your health care provider. Make sure you discuss any questions you have with your health care provider. Document Revised: 05/31/2017 Document Reviewed: 04/07/2016 Elsevier Patient Education  2020 [employer]  

## 2020-02-18 ENCOUNTER — Telehealth: Payer: Self-pay | Admitting: Family Medicine

## 2020-02-18 ENCOUNTER — Encounter: Payer: Self-pay | Admitting: Family Medicine

## 2020-02-18 DIAGNOSIS — O24419 Gestational diabetes mellitus in pregnancy, unspecified control: Secondary | ICD-10-CM | POA: Insufficient documentation

## 2020-02-18 DIAGNOSIS — Z8632 Personal history of gestational diabetes: Secondary | ICD-10-CM | POA: Insufficient documentation

## 2020-02-18 DIAGNOSIS — Z3490 Encounter for supervision of normal pregnancy, unspecified, unspecified trimester: Secondary | ICD-10-CM

## 2020-02-18 LAB — CBC
Hematocrit: 34.9 % (ref 34.0–46.6)
Hemoglobin: 11.7 g/dL (ref 11.1–15.9)
MCH: 28.8 pg (ref 26.6–33.0)
MCHC: 33.5 g/dL (ref 31.5–35.7)
MCV: 86 fL (ref 79–97)
Platelets: 228 10*3/uL (ref 150–450)
RBC: 4.06 x10E6/uL (ref 3.77–5.28)
RDW: 12.6 % (ref 11.7–15.4)
WBC: 12.6 10*3/uL — ABNORMAL HIGH (ref 3.4–10.8)

## 2020-02-18 LAB — GLUCOSE TOLERANCE, 2 HOURS W/ 1HR
Glucose, 1 hour: 203 mg/dL — ABNORMAL HIGH (ref 65–179)
Glucose, 2 hour: 160 mg/dL — ABNORMAL HIGH (ref 65–152)
Glucose, Fasting: 92 mg/dL — ABNORMAL HIGH (ref 65–91)

## 2020-02-18 LAB — HIV ANTIBODY (ROUTINE TESTING W REFLEX): HIV Screen 4th Generation wRfx: NONREACTIVE

## 2020-02-18 LAB — RPR: RPR Ser Ql: NONREACTIVE

## 2020-02-18 NOTE — Telephone Encounter (Signed)
Please call patient and let her know her 2hr GTT came back abnormal c/w GDM.   Schedule nutrition/education and order testing supplies, she will also need a follow up growth [place] scheduled.

## 2020-02-24 ENCOUNTER — Other Ambulatory Visit: Payer: Self-pay | Admitting: Lactation Services

## 2020-02-24 ENCOUNTER — Ambulatory Visit: Payer: Medicaid Other | Admitting: Registered"

## 2020-02-24 ENCOUNTER — Other Ambulatory Visit: Payer: Self-pay

## 2020-02-24 DIAGNOSIS — O24419 Gestational diabetes mellitus in pregnancy, unspecified control: Secondary | ICD-10-CM

## 2020-02-24 MED ORDER — ACCU-CHEK GUIDE VI STRP: ORAL_STRIP | 12 refills | Status: DC

## 2020-02-24 MED ORDER — ACCU-CHEK SOFTCLIX LANCETS MISC: 12 refills | Status: DC

## 2020-02-24 NOTE — Progress Notes (Signed)
Patient speaks English but prefers written information in [place]ese  Patient was seen on 02/24/20 for Gestational Diabetes self-management. EDD 05/11/20. [redacted]w[redacted]d Patient states no history of GDM. Diet history obtained. Patient eats variety of all food groups. Beverages include water, coconut juice, lemonade, orange, WIC approved juice.  Patient reports no structured physical activity.  The following learning objectives were met by the patient :   States the definition of Gestational Diabetes  States why dietary management is important in controlling blood glucose  Describes the effects of carbohydrates on blood glucose levels  Demonstrates ability to create a balanced meal plan  Demonstrates carbohydrate counting   States when to check blood glucose levels  Demonstrates proper blood glucose monitoring techniques  States the effect of stress and exercise on blood glucose levels  States the importance of limiting caffeine and abstaining from alcohol and smoking  Plan:  Aim for 3 Carbohydrate Choices per meal (45 grams) +/- 1 either way  Aim for 1-2 Carbohydrate Choices per snack Begin reading food labels for Total Carbohydrate of foods If OK with your MD, consider  increasing your activity level by walking, Arm Chair Exercises or other activity daily as tolerated Begin checking Blood Glucose before breakfast and 2 hours after first bite of breakfast, lunch and dinner as directed by MD  Bring Log Book/Sheet and meter to every medical appointment  Baby Scripts: (BS 2.0 not capable of glucose management at this time.) Patient to record blood sugar on glucose log sheet  Take medication if directed by MD  Blood glucose monitor given: Accu-chek Guide Me Lot [id number]: 01/06/21 CBG: 140 mg/dL  Rx order placed for  Accu-chek Guide strips and Softclix lancets  Patient instructed to monitor glucose levels: FBS: 60 - 95 mg/dl 2 hour: <120 mg/dl  Patient received the following  handouts:  Nutrition Diabetes and Pregnancy  Carbohydrate Counting List  Blood glucose Log Sheet  Patient will be seen for follow-up 1 in weeks or as needed.

## 2020-02-24 NOTE — Progress Notes (Signed)
Accu Chek Srips and lancets ordered per Heywood Bene, RD. Patient with GDM.

## 2020-02-24 NOTE — Telephone Encounter (Signed)
Per chart review, supplies have been ordered and pt seen by diabetes education. Follow-up US scheduled for first available appt 03/15/20 with 1315 arrival time. Called pt; VM left with appt information. MyChart message sent.

## 2020-03-02 ENCOUNTER — Other Ambulatory Visit: Payer: Medicaid Other

## 2020-03-02 ENCOUNTER — Encounter: Payer: Medicaid Other | Attending: Family Medicine | Admitting: Registered"

## 2020-03-02 ENCOUNTER — Other Ambulatory Visit: Payer: Self-pay

## 2020-03-02 ENCOUNTER — Ambulatory Visit: Payer: Medicaid Other | Admitting: Registered"

## 2020-03-02 VITALS — Wt 181.0 lb

## 2020-03-02 DIAGNOSIS — Z3A00 Weeks of gestation of pregnancy not specified: Secondary | ICD-10-CM | POA: Diagnosis not present

## 2020-03-02 DIAGNOSIS — O24419 Gestational diabetes mellitus in pregnancy, unspecified control: Secondary | ICD-10-CM | POA: Insufficient documentation

## 2020-03-02 NOTE — Progress Notes (Signed)
Patient was seen on 03/02/20 for follow-up assessment and education for Gestational Diabetes. EDD 05/11/20.   Patient is testing blood glucose as directed pre breakfast and 2 hours after each meal. Pt states the missing data in log sheet is when she skips a meal.   The following learning objectives reviewed during follow-up visit:   Effect of juice and sweetened beverages on blood sugar  Exercise role in blood sugar control  Plan:  Consider drinking sugar free beverages  Squeeze some lemon juice in water  Use Crystal Light or other sugar-free drop-in flavor  Diet varieties of drink or juice  Daily exercise. Consider doing after meals especially if there is extra rice  Patient instructed to monitor glucose levels: FBS: 60 - 95 mg/dl 2 hour: <856 mg/dl  Patient received the following handouts:  none  Patient will be seen for follow-up as needed.

## 2020-03-02 NOTE — Patient Instructions (Signed)
Consider drinking sugar free beverages  Squeeze some lemon juice in water  Use Crystal Light or other sugar-free drop-in flavor  Diet varieties of drink or juice  Daily exercise. Consider doing after meals especially if there is extra rice

## 2020-03-03 ENCOUNTER — Encounter: Payer: Self-pay | Admitting: Family Medicine

## 2020-03-03 ENCOUNTER — Ambulatory Visit (INDEPENDENT_AMBULATORY_CARE_PROVIDER_SITE_OTHER): Payer: Medicaid Other | Admitting: Family Medicine

## 2020-03-03 VITALS — BP 104/68 | HR 78 | Wt 180.0 lb

## 2020-03-03 DIAGNOSIS — Z3493 Encounter for supervision of normal pregnancy, unspecified, third trimester: Secondary | ICD-10-CM

## 2020-03-03 DIAGNOSIS — O99213 Obesity complicating pregnancy, third trimester: Secondary | ICD-10-CM

## 2020-03-03 DIAGNOSIS — O24419 Gestational diabetes mellitus in pregnancy, unspecified control: Secondary | ICD-10-CM

## 2020-03-03 LAB — POCT URINALYSIS DIP (DEVICE)
Bilirubin Urine: NEGATIVE
Glucose, UA: NEGATIVE mg/dL
Ketones, ur: NEGATIVE mg/dL
Nitrite: NEGATIVE
Protein, ur: NEGATIVE mg/dL
Specific Gravity, Urine: 1.025 (ref 1.005–1.030)
Urobilinogen, UA: 0.2 mg/dL (ref 0.0–1.0)
pH: 6.5 (ref 5.0–8.0)

## 2020-03-03 MED ORDER — METFORMIN HCL 500 MG PO TABS: 500.0000 mg | ORAL_TABLET | Freq: Two times a day (BID) | ORAL | 5 refills | Status: DC

## 2020-03-03 MED ORDER — PRAMOXINE-HC 1-2.5 % EX CREA
TOPICAL_CREAM | Freq: Three times a day (TID) | CUTANEOUS | 3 refills | Status: DC
Start: 2020-03-03 — End: 2020-03-15

## 2020-03-03 NOTE — Progress Notes (Signed)
   Subjective:  Paula Stark is a 29 y.o. [id number] at [redacted]w[redacted]d being seen today for ongoing prenatal care.  She is currently monitored for the following issues for this high-risk pregnancy and has Supervision of low-risk pregnancy; Obesity affecting pregnancy; Hemorrhoids during pregnancy in second trimester; and GDM (gestational diabetes mellitus) on their problem list.  Patient reports no complaints.  Contractions: Not present. Vag. Bleeding: None.  Movement: Present. Denies leaking of fluid.   The following portions of the patient's history were reviewed and updated as appropriate: allergies, current medications, past family history, past medical history, past social history, past surgical history and problem list. Problem list updated.  Objective:   Vitals:   03/03/20 0926  BP: 104/68  Pulse: 78  Weight: 180 lb (81.6 kg)    Fetal Status: Fetal Heart Rate (bpm): 138   Movement: Present     General:  Alert, oriented and cooperative. Patient is in no acute distress.  Skin: Skin is warm and dry. No rash noted.   Cardiovascular: Normal heart rate noted  Respiratory: Normal respiratory effort, no problems with respiration noted  Abdomen: Soft, gravid, appropriate for gestational age. Pain/Pressure: Present     Pelvic: Vag. Bleeding: None     Cervical exam deferred        Extremities: Normal range of motion.  Edema: None  Mental Status: Normal mood and affect. Normal behavior. Normal judgment and thought content.   Urinalysis:      Assessment and Plan:  Pregnancy: [id number] at [redacted]w[redacted]d  1. Encounter for supervision of low-risk pregnancy in third trimester BP and FHR normal Reviewed IUD vs Nexlanon, prefers nexplanon  2. Gestational diabetes mellitus (GDM), antepartum, gestational diabetes method of control unspecified Diagnosed at last visit, has seen nutrition for education last week and then had follow up yesterday At that visit sugars were only 1/3 at goal, fastings close to goal but post  prandials vary widely from 91-225 with majority not at goal Discussed starting metformin and improving dietary adherence, she is in agreement Has f/u growth scheduled for 03/15/2020, will also need to start weekly BPP at that time  3. Obesity affecting pregnancy in third trimester   Preterm labor symptoms and general obstetric precautions including but not limited to vaginal bleeding, contractions, leaking of fluid and fetal movement were reviewed in detail with the patient. Please refer to After Visit Summary for other counseling recommendations.  Return in 2 weeks (on 03/17/2020) for [place], ob visit.   Venora Maples, MD

## 2020-03-03 NOTE — Patient Instructions (Signed)
 Contraception Choices Contraception, also called birth control, refers to methods or devices that prevent pregnancy. Hormonal methods Contraceptive implant  A contraceptive implant is a thin, plastic tube that contains a hormone. It is inserted into the upper part of the arm. It can remain in place for up to 3 years. Progestin-only injections Progestin-only injections are injections of progestin, a synthetic form of the hormone progesterone. They are given every 3 months by a health care provider. Birth control pills  Birth control pills are pills that contain hormones that prevent pregnancy. They must be taken once a day, preferably at the same time each day. Birth control patch  The birth control patch contains hormones that prevent pregnancy. It is placed on the skin and must be changed once a week for three weeks and removed on the fourth week. A prescription is needed to use this method of contraception. Vaginal ring  A vaginal ring contains hormones that prevent pregnancy. It is placed in the vagina for three weeks and removed on the fourth week. After that, the process is repeated with a new ring. A prescription is needed to use this method of contraception. Emergency contraceptive Emergency contraceptives prevent pregnancy after unprotected sex. They come in pill form and can be taken up to 5 days after sex. They work best the sooner they are taken after having sex. Most emergency contraceptives are available without a prescription. This method should not be used as your only form of birth control. Barrier methods Female condom  A female condom is a thin sheath that is worn over the penis during sex. Condoms keep sperm from going inside a woman's body. They can be used with a spermicide to increase their effectiveness. They should be disposed after a single use. Female condom  A female condom is a soft, loose-fitting sheath that is put into the vagina before sex. The condom keeps  sperm from going inside a woman's body. They should be disposed after a single use. Diaphragm  A diaphragm is a soft, dome-shaped barrier. It is inserted into the vagina before sex, along with a spermicide. The diaphragm blocks sperm from entering the uterus, and the spermicide kills sperm. A diaphragm should be left in the vagina for 6-8 hours after sex and removed within 24 hours. A diaphragm is prescribed and fitted by a health care provider. A diaphragm should be replaced every 1-2 years, after giving birth, after gaining more than 15 lb (6.8 kg), and after pelvic surgery. Cervical cap  A cervical cap is a round, soft latex or plastic cup that fits over the cervix. It is inserted into the vagina before sex, along with spermicide. It blocks sperm from entering the uterus. The cap should be left in place for 6-8 hours after sex and removed within 48 hours. A cervical cap must be prescribed and fitted by a health care provider. It should be replaced every 2 years. Sponge  A sponge is a soft, circular piece of polyurethane foam with spermicide on it. The sponge helps block sperm from entering the uterus, and the spermicide kills sperm. To use it, you make it wet and then insert it into the vagina. It should be inserted before sex, left in for at least 6 hours after sex, and removed and thrown away within 30 hours. Spermicides Spermicides are chemicals that kill or block sperm from entering the cervix and uterus. They can come as a cream, jelly, suppository, foam, or tablet. A spermicide should be inserted into   the vagina with an applicator at least 10-15 minutes before sex to allow time for it to work. The process must be repeated every time you have sex. Spermicides do not require a prescription. Intrauterine contraception Intrauterine device (IUD) An IUD is a T-shaped device that is put in a woman's uterus. There are two types:  Hormone IUD.This type contains progestin, a synthetic form of the  hormone progesterone. This type can stay in place for 3-5 years.  Copper IUD.This type is wrapped in copper wire. It can stay in place for 10 years.  Permanent methods of contraception Female tubal ligation In this method, a woman's fallopian tubes are sealed, tied, or blocked during surgery to prevent eggs from traveling to the uterus. Hysteroscopic sterilization In this method, a small, flexible insert is placed into each fallopian tube. The inserts cause scar tissue to form in the fallopian tubes and block them, so sperm cannot reach an egg. The procedure takes about 3 months to be effective. Another form of birth control must be used during those 3 months. Female sterilization This is a procedure to tie off the tubes that carry sperm (vasectomy). After the procedure, the man can still ejaculate fluid (semen). Natural planning methods Natural family planning In this method, a couple does not have sex on days when the woman could become pregnant. Calendar method This means keeping track of the length of each menstrual cycle, identifying the days when pregnancy can happen, and not having sex on those days. Ovulation method In this method, a couple avoids sex during ovulation. Symptothermal method This method involves not having sex during ovulation. The woman typically checks for ovulation by watching changes in her temperature and in the consistency of cervical mucus. Post-ovulation method In this method, a couple waits to have sex until after ovulation. Summary  Contraception, also called birth control, means methods or devices that prevent pregnancy.  Hormonal methods of contraception include implants, injections, pills, patches, vaginal rings, and emergency contraceptives.  Barrier methods of contraception can include female condoms, female condoms, diaphragms, cervical caps, sponges, and spermicides.  There are two types of IUDs (intrauterine devices). An IUD can be put in a woman's  uterus to prevent pregnancy for 3-5 years.  Permanent sterilization can be done through a procedure for males, females, or both.  Natural family planning methods involve not having sex on days when the woman could become pregnant. This information is not intended to replace advice given to you by your health care provider. Make sure you discuss any questions you have with your health care provider. Document Revised: 03/08/2017 Document Reviewed: 04/08/2016 Elsevier Patient Education  2020 [employer]   Breastfeeding  Choosing to breastfeed is one of the best decisions you can make for yourself and your baby. A change in hormones during pregnancy causes your breasts to make breast milk in your milk-producing glands. Hormones prevent breast milk from being released before your baby is born. They also prompt milk flow after birth. Once breastfeeding has begun, thoughts of your baby, as well as his or her sucking or crying, can stimulate the release of milk from your milk-producing glands. Benefits of breastfeeding Research shows that breastfeeding offers many health benefits for infants and mothers. It also offers a cost-free and convenient way to feed your baby. For your baby  Your first milk (colostrum) helps your baby's digestive system to function better.  Special cells in your milk (antibodies) help your baby to fight off infections.  Breastfed babies are   less likely to develop asthma, allergies, obesity, or type 2 diabetes. They are also at lower risk for sudden infant death syndrome (SIDS).  Nutrients in breast milk are better able to meet your baby's needs compared to infant formula.  Breast milk improves your baby's brain development. For you  Breastfeeding helps to create a very special bond between you and your baby.  Breastfeeding is convenient. Breast milk costs nothing and is always available at the correct temperature.  Breastfeeding helps to burn calories. It helps you  to lose the weight that you gained during pregnancy.  Breastfeeding makes your uterus return faster to its size before pregnancy. It also slows bleeding (lochia) after you give birth.  Breastfeeding helps to lower your risk of developing type 2 diabetes, osteoporosis, rheumatoid arthritis, cardiovascular disease, and breast, ovarian, uterine, and endometrial cancer later in life. Breastfeeding basics Starting breastfeeding  Find a comfortable place to sit or lie down, with your neck and back well-supported.  Place a pillow or a rolled-up blanket under your baby to bring him or her to the level of your breast (if you are seated). Nursing pillows are specially designed to help support your arms and your baby while you breastfeed.  Make sure that your baby's tummy (abdomen) is facing your abdomen.  Gently massage your breast. With your fingertips, massage from the outer edges of your breast inward toward the nipple. This encourages milk flow. If your milk flows slowly, you may need to continue this action during the feeding.  Support your breast with 4 fingers underneath and your thumb above your nipple (make the letter "C" with your hand). Make sure your fingers are well away from your nipple and your baby's mouth.  Stroke your baby's lips gently with your finger or nipple.  When your baby's mouth is open wide enough, quickly bring your baby to your breast, placing your entire nipple and as much of the areola as possible into your baby's mouth. The areola is the colored area around your nipple. ? More areola should be visible above your baby's upper lip than below the lower lip. ? Your baby's lips should be opened and extended outward (flanged) to ensure an adequate, comfortable latch. ? Your baby's tongue should be between his or her lower gum and your breast.  Make sure that your baby's mouth is correctly positioned around your nipple (latched). Your baby's lips should create a seal on your  breast and be turned out (everted).  It is common for your baby to suck about 2-3 minutes in order to start the flow of breast milk. Latching Teaching your baby how to latch onto your breast properly is very important. An improper latch can cause nipple pain, decreased milk supply, and poor weight gain in your baby. Also, if your baby is not latched onto your nipple properly, he or she may swallow some air during feeding. This can make your baby fussy. Burping your baby when you switch breasts during the feeding can help to get rid of the air. However, teaching your baby to latch on properly is still the best way to prevent fussiness from swallowing air while breastfeeding. Signs that your baby has successfully latched onto your nipple  Silent tugging or silent sucking, without causing you pain. Infant's lips should be extended outward (flanged).  Swallowing heard between every 3-4 sucks once your milk has started to flow (after your let-down milk reflex occurs).  Muscle movement above and in front of his or her   ears while sucking. Signs that your baby has not successfully latched onto your nipple  Sucking sounds or smacking sounds from your baby while breastfeeding.  Nipple pain. If you think your baby has not latched on correctly, slip your finger into the corner of your baby's mouth to break the suction and place it between your baby's gums. Attempt to start breastfeeding again. Signs of successful breastfeeding Signs from your baby  Your baby will gradually decrease the number of sucks or will completely stop sucking.  Your baby will fall asleep.  Your baby's body will relax.  Your baby will retain a small amount of milk in his or her mouth.  Your baby will let go of your breast by himself or herself. Signs from you  Breasts that have increased in firmness, weight, and size 1-3 hours after feeding.  Breasts that are softer immediately after breastfeeding.  Increased milk  volume, as well as a change in milk consistency and color by the fifth day of breastfeeding.  Nipples that are not sore, cracked, or bleeding. Signs that your baby is getting enough milk  Wetting at least 1-2 diapers during the first 24 hours after birth.  Wetting at least 5-6 diapers every 24 hours for the first week after birth. The urine should be clear or pale yellow by the age of 5 days.  Wetting 6-8 diapers every 24 hours as your baby continues to grow and develop.  At least 3 stools in a 24-hour period by the age of 5 days. The stool should be soft and yellow.  At least 3 stools in a 24-hour period by the age of 7 days. The stool should be seedy and yellow.  No loss of weight greater than 10% of birth weight during the first 3 days of life.  Average weight gain of 4-7 oz (113-198 g) per week after the age of 4 days.  Consistent daily weight gain by the age of 5 days, without weight loss after the age of 2 weeks. After a feeding, your baby may spit up a small amount of milk. This is normal. Breastfeeding frequency and duration Frequent feeding will help you make more milk and can prevent sore nipples and extremely full breasts (breast engorgement). Breastfeed when you feel the need to reduce the fullness of your breasts or when your baby shows signs of hunger. This is called "breastfeeding on demand." Signs that your baby is hungry include:  Increased alertness, activity, or restlessness.  Movement of the head from side to side.  Opening of the mouth when the corner of the mouth or cheek is stroked (rooting).  Increased sucking sounds, smacking lips, cooing, sighing, or squeaking.  Hand-to-mouth movements and sucking on fingers or hands.  Fussing or crying. Avoid introducing a pacifier to your baby in the first 4-6 weeks after your baby is born. After this time, you may choose to use a pacifier. Research has shown that pacifier use during the first year of a baby's life  decreases the risk of sudden infant death syndrome (SIDS). Allow your baby to feed on each breast as long as he or she wants. When your baby unlatches or falls asleep while feeding from the first breast, offer the second breast. Because newborns are often sleepy in the first few weeks of life, you may need to awaken your baby to get him or her to feed. Breastfeeding times will vary from baby to baby. However, the following rules can serve as a guide to   help you make sure that your baby is properly fed:  Newborns (babies 4 weeks of age or younger) may breastfeed every 1-3 hours.  Newborns should not go without breastfeeding for longer than 3 hours during the day or 5 hours during the night.  You should breastfeed your baby a minimum of 8 times in a 24-hour period. Breast milk pumping     Pumping and storing breast milk allows you to make sure that your baby is exclusively fed your breast milk, even at times when you are unable to breastfeed. This is especially important if you go back to work while you are still breastfeeding, or if you are not able to be present during feedings. Your lactation consultant can help you find a method of pumping that works best for you and give you guidelines about how long it is safe to store breast milk. Caring for your breasts while you breastfeed Nipples can become dry, cracked, and sore while breastfeeding. The following recommendations can help keep your breasts moisturized and healthy:  Avoid using soap on your nipples.  Wear a supportive bra designed especially for nursing. Avoid wearing underwire-style bras or extremely tight bras (sports bras).  Air-dry your nipples for 3-4 minutes after each feeding.  Use only cotton bra pads to absorb leaked breast milk. Leaking of breast milk between feedings is normal.  Use lanolin on your nipples after breastfeeding. Lanolin helps to maintain your skin's normal moisture barrier. Pure lanolin is not harmful (not  toxic) to your baby. You may also hand express a few drops of breast milk and gently massage that milk into your nipples and allow the milk to air-dry. In the first few weeks after giving birth, some women experience breast engorgement. Engorgement can make your breasts feel heavy, warm, and tender to the touch. Engorgement peaks within 3-5 days after you give birth. The following recommendations can help to ease engorgement:  Completely empty your breasts while breastfeeding or pumping. You may want to start by applying warm, moist heat (in the shower or with warm, water-soaked hand towels) just before feeding or pumping. This increases circulation and helps the milk flow. If your baby does not completely empty your breasts while breastfeeding, pump any extra milk after he or she is finished.  Apply ice packs to your breasts immediately after breastfeeding or pumping, unless this is too uncomfortable for you. To do this: ? Put ice in a plastic bag. ? Place a towel between your skin and the bag. ? Leave the ice on for 20 minutes, 2-3 times a day.  Make sure that your baby is latched on and positioned properly while breastfeeding. If engorgement persists after 48 hours of following these recommendations, contact your health care provider or a lactation consultant. Overall health care recommendations while breastfeeding  Eat 3 healthy meals and 3 snacks every day. Well-nourished mothers who are breastfeeding need an additional 450-500 calories a day. You can meet this requirement by increasing the amount of a balanced diet that you eat.  Drink enough water to keep your urine pale yellow or clear.  Rest often, relax, and continue to take your prenatal vitamins to prevent fatigue, stress, and low vitamin and mineral levels in your body (nutrient deficiencies).  Do not use any products that contain nicotine or tobacco, such as cigarettes and e-cigarettes. Your baby may be harmed by chemicals from  cigarettes that pass into breast milk and exposure to secondhand smoke. If you need help quitting, ask your   health care provider.  Avoid alcohol.  Do not use illegal drugs or marijuana.  Talk with your health care provider before taking any medicines. These include over-the-counter and prescription medicines as well as vitamins and herbal supplements. Some medicines that may be harmful to your baby can pass through breast milk.  It is possible to become pregnant while breastfeeding. If birth control is desired, ask your health care provider about options that will be safe while breastfeeding your baby. Where to find more information: [employer]: www.llli.org Contact a health care provider if:  You feel like you want to stop breastfeeding or have become frustrated with breastfeeding.  Your nipples are cracked or bleeding.  Your breasts are red, tender, or warm.  You have: ? Painful breasts or nipples. ? A swollen area on either breast. ? A fever or chills. ? Nausea or vomiting. ? Drainage other than breast milk from your nipples.  Your breasts do not become full before feedings by the fifth day after you give birth.  You feel sad and depressed.  Your baby is: ? Too sleepy to eat well. ? Having trouble sleeping. ? More than 1 week old and wetting fewer than 6 diapers in a 24-hour period. ? Not gaining weight by 5 days of age.  Your baby has fewer than 3 stools in a 24-hour period.  Your baby's skin or the white parts of his or her eyes become yellow. Get help right away if:  Your baby is overly tired (lethargic) and does not want to wake up and feed.  Your baby develops an unexplained fever. Summary  Breastfeeding offers many health benefits for infant and mothers.  Try to breastfeed your infant when he or she shows early signs of hunger.  Gently tickle or stroke your baby's lips with your finger or nipple to allow the baby to open his or her mouth.  Bring the baby to your breast. Make sure that much of the areola is in your baby's mouth. Offer one side and burp the baby before you offer the other side.  Talk with your health care provider or lactation consultant if you have questions or you face problems as you breastfeed. This information is not intended to replace advice given to you by your health care provider. Make sure you discuss any questions you have with your health care provider. Document Revised: 05/31/2017 Document Reviewed: 04/07/2016 Elsevier Patient Education  2020 [employer].  

## 2020-03-15 ENCOUNTER — Ambulatory Visit: Payer: Medicaid Other

## 2020-03-15 ENCOUNTER — Other Ambulatory Visit: Payer: Self-pay

## 2020-03-15 ENCOUNTER — Ambulatory Visit: Payer: Medicaid Other | Attending: Family Medicine | Admitting: *Deleted

## 2020-03-15 ENCOUNTER — Encounter: Payer: Self-pay | Admitting: *Deleted

## 2020-03-15 ENCOUNTER — Ambulatory Visit (HOSPITAL_BASED_OUTPATIENT_CLINIC_OR_DEPARTMENT_OTHER): Payer: Medicaid Other

## 2020-03-15 ENCOUNTER — Telehealth: Payer: Self-pay | Admitting: Lactation Services

## 2020-03-15 ENCOUNTER — Other Ambulatory Visit: Payer: Self-pay | Admitting: *Deleted

## 2020-03-15 DIAGNOSIS — O24419 Gestational diabetes mellitus in pregnancy, unspecified control: Secondary | ICD-10-CM | POA: Diagnosis not present

## 2020-03-15 DIAGNOSIS — O24415 Gestational diabetes mellitus in pregnancy, controlled by oral hypoglycemic drugs: Secondary | ICD-10-CM | POA: Insufficient documentation

## 2020-03-15 DIAGNOSIS — Z3490 Encounter for supervision of normal pregnancy, unspecified, unspecified trimester: Secondary | ICD-10-CM | POA: Diagnosis not present

## 2020-03-15 DIAGNOSIS — Z3A31 31 weeks gestation of pregnancy: Secondary | ICD-10-CM | POA: Insufficient documentation

## 2020-03-15 DIAGNOSIS — Z3493 Encounter for supervision of normal pregnancy, unspecified, third trimester: Secondary | ICD-10-CM

## 2020-03-15 MED ORDER — PRAMOXINE-HC 1-2.5 % EX CREA: TOPICAL_CREAM | Freq: Three times a day (TID) | CUTANEOUS | 3 refills | Status: DC

## 2020-03-15 MED ORDER — POLYETHYLENE GLYCOL 3350 17 GM/SCOOP PO POWD: 17.0000 g | Freq: Every day | ORAL | 1 refills | Status: DC | PRN

## 2020-03-15 NOTE — Addendum Note (Signed)
Addended by: Merian Capron on: 03/15/2020 10:44 AM   Modules accepted: Orders

## 2020-03-15 NOTE — Telephone Encounter (Signed)
Fine to give refill, sent to pharmacy.   Needs to take miralax daily, will take some time for her to see improvement.

## 2020-03-15 NOTE — Telephone Encounter (Signed)
Returned call to patient after advisement from Dr. Crissie Reese.   Patient did not answer, LM for her to call the office with further questions or concerns. Will send My Chart message.

## 2020-03-15 NOTE — Telephone Encounter (Signed)
Patient came into the office and was asking for a refill on Proctofoam.   She reports she has hemorrhoids are there daily and she has used all the 11 Proctofoam prescriptions that was sent in. She reports that the cream that was prescribed is not covered by [facility] and she would like Proctofoam refilled. She has an appointment on Thursday for follow up.   She reports she has been taking the Miralax but still has hemorrhoids. Will route to Dr. Crissie Reese for advisement.

## 2020-03-18 ENCOUNTER — Other Ambulatory Visit: Payer: Self-pay

## 2020-03-18 ENCOUNTER — Ambulatory Visit (INDEPENDENT_AMBULATORY_CARE_PROVIDER_SITE_OTHER): Payer: Medicaid Other | Admitting: Family Medicine

## 2020-03-18 VITALS — BP 109/82 | HR 96

## 2020-03-18 DIAGNOSIS — L299 Pruritus, unspecified: Secondary | ICD-10-CM | POA: Diagnosis not present

## 2020-03-18 DIAGNOSIS — O99713 Diseases of the skin and subcutaneous tissue complicating pregnancy, third trimester: Secondary | ICD-10-CM | POA: Diagnosis not present

## 2020-03-18 DIAGNOSIS — O2242 Hemorrhoids in pregnancy, second trimester: Secondary | ICD-10-CM

## 2020-03-18 DIAGNOSIS — O99213 Obesity complicating pregnancy, third trimester: Secondary | ICD-10-CM

## 2020-03-18 DIAGNOSIS — Z3493 Encounter for supervision of normal pregnancy, unspecified, third trimester: Secondary | ICD-10-CM

## 2020-03-18 DIAGNOSIS — O24419 Gestational diabetes mellitus in pregnancy, unspecified control: Secondary | ICD-10-CM

## 2020-03-18 MED ORDER — HYDROCORTISONE 1 % EX OINT: 1.0000 "application " | TOPICAL_OINTMENT | Freq: Two times a day (BID) | CUTANEOUS | 0 refills | Status: DC

## 2020-03-18 MED ORDER — PROCTOFOAM HC 1-1 % EX FOAM
1.0000 | Freq: Two times a day (BID) | CUTANEOUS | 11 refills | Status: DC
Start: 2020-03-18 — End: 2020-05-05

## 2020-03-18 MED ORDER — LORATADINE 10 MG PO TABS: 10.0000 mg | ORAL_TABLET | Freq: Every day | ORAL | 2 refills | Status: DC

## 2020-03-18 NOTE — Patient Instructions (Signed)
Gestational Diabetes Mellitus, Diagnosis Gestational diabetes (gestational diabetes mellitus) is a temporary form of diabetes that some women develop during pregnancy. It usually occurs around weeks 24-28 of pregnancy, and it goes away after delivery. Hormonal changes during pregnancy can interfere with insulin production and function, which may result in one or both of these problems:  The pancreas does not make enough of a hormone called insulin.  Cells in the body do not respond properly to insulin that the body makes (insulin resistance). Normally, insulin allows blood sugar (glucose) to enter cells in the body. The cells use glucose for energy. Insulin resistance or lack of insulin causes excess glucose to build up in the blood instead of going into cells. As a result, high blood glucose (hyperglycemia) develops. If gestational diabetes is treated, it is not likely to cause problems. If it is not controlled with treatment, it may cause problems during labor and delivery, and some of those problems can be harmful to the unborn baby (fetus) and the mother. Women who get gestational diabetes are more likely to develop it if they get pregnant again, and they are more likely to develop type 2 diabetes in the future. What increases the risk? This condition may be more likely to develop in pregnant women who:  Are older than age 25 during pregnancy.  Have a family history of diabetes.  Are overweight.  Had gestational diabetes in the past.  Have polycystic ovary syndrome (PCOS).  Are pregnant with twins or multiples.  Are of American-Indian, African-American, Hispanic/Latino, or Asian/Pacific Islander descent. What are the signs or symptoms? Most women do not notice symptoms of gestational diabetes because the symptoms are similar to normal symptoms of pregnancy. Symptoms of gestational diabetes may include:  Increased thirst (polydipsia).  Increased hunger(polyphagia).  Increased  urination (polyuria). How is this diagnosed? This condition may be diagnosed based on your blood glucose level, which may be checked with one or more of the following blood tests:  A fasting blood glucose (FBG) test. You will not be allowed to eat (you will fast) for 8 hours or longer before a blood sample is taken.  A random blood glucose test. This checks your blood glucose at any time of day regardless of when you ate.  An oral glucose tolerance test (OGTT). This is usually done during weeks 24-28 of pregnancy. ? For this test, you will have an FBG test done. Then, you will drink a beverage that contains glucose. Your blood glucose will be tested again one hour after you drink the glucose beverage (1-hour OGTT). ? If the 1-hour OGTT result is at or above 140 mg/dL (7.8 mmol/L), you will repeat the OGTT. This time, your blood glucose will be tested 3 hours after you drink the glucose beverage (3-hour OGTT). If you have risk factors, you may be screened for undiagnosed type 2 diabetes at your first health care visit during your pregnancy (prenatal visit). How is this treated?     Your treatment may be managed by a specialist called an endocrinologist. This condition is treated by following instructions from your health care provider about:  Eating a healthy diet and getting more physical activity. These changes are the most important ways to manage gestational diabetes.  Checking your blood glucose. Do this as often as told.  Taking diabetes medicines or insulin every day. These will only be prescribed if they are needed. ? If you use insulin, you may need to adjust your dosage based on how physically active   active you are and what foods you eat. Your health care provider will tell you how to do this. Your health care provider will set treatment goals for you based on the stage of your pregnancy and any other medical conditions you have. Generally, the goal of treatment is to maintain the following  blood glucose levels during pregnancy:  Before meals (preprandial): at or below 95 mg/dL (5.3 mmol/L).  After meals (postprandial): ? One hour after a meal: at or below 140 mg/dL (7.8 mmol/L). ? Two hours after a meal: at or below 120 mg/dL (6.7 mmol/L).  A1c (hemoglobin A1c) level: 6-6.5%. Follow these instructions at home: Questions to ask your health care provider  Consider asking the following questions: ? Do I need to meet with a diabetes educator? ? What equipment will I need to manage my diabetes at home? ? What diabetes medicines do I need, and when should I take them? ? How often do I need to check my blood glucose? ? What number can I call if I have questions? ? When is my next appointment? General instructions  Take over-the-counter and prescription medicines only as told by your health care provider.  Manage your weight gain during pregnancy. The amount of weight that you are expected to gain depends on your pre-pregnancy BMI (body mass index).  Keep all follow-up visits as told by your health care provider. This is important.  For more information about diabetes, visit: ? American Diabetes Association (ADA): www.diabetes.org ? American Association of Diabetes Educators (AADE): www.diabeteseducator.org Contact a health care provider if:  Your blood glucose level is at or above 240 mg/dL (32.4 mmol/L).  Your blood glucose level is at or above 200 mg/dL (40.1 mmol/L) and you have ketones in your urine.  You have been sick or have had a fever for 2 days or longer and you are not getting better.  You have any of the following problems for more than 6 hours: ? You cannot eat or drink. ? You have nausea and vomiting. ? You have diarrhea. Get help right away if:  Your blood glucose is lower than 54 mg/dL (3 mmol/L).  You become confused or you have trouble thinking clearly.  You have difficulty breathing.  You have moderate or large ketone levels in your  urine.  Your baby is moving around less than usual.  You develop unusual discharge or bleeding from your vagina.  You start having contractions early (prematurely). Contractions may feel like a tightening in your lower abdomen. Summary  Gestational diabetes (gestational diabetes mellitus) is a temporary form of diabetes that some women develop during pregnancy. It usually occurs around weeks 24-28 of pregnancy, and it goes away after delivery.  This condition is treated by making diet and lifestyle changes and taking diabetes medicines or insulin, if needed.  Women who get gestational diabetes are more likely to develop it if they get pregnant again, and they are more likely to develop type 2 diabetes in the future. This information is not intended to replace advice given to you by your health care provider. Make sure you discuss any questions you have with your health care provider. Document Revised: 04/12/2017 Document Reviewed: 04/09/2015 Elsevier Patient Education  2020 [employer].   Contraception Choices Contraception, also called birth control, refers to methods or devices that prevent pregnancy. Hormonal methods Contraceptive implant  A contraceptive implant is a thin, plastic tube that contains a hormone. It is inserted into the upper part of the arm. It  can remain in place for up to 3 years. Progestin-only injections Progestin-only injections are injections of progestin, a synthetic form of the hormone progesterone. They are given every 3 months by a health care provider. Birth control pills  Birth control pills are pills that contain hormones that prevent pregnancy. They must be taken once a day, preferably at the same time each day. Birth control patch  The birth control patch contains hormones that prevent pregnancy. It is placed on the skin and must be changed once a week for three weeks and removed on the fourth week. A prescription is needed to use this method of  contraception. Vaginal ring  A vaginal ring contains hormones that prevent pregnancy. It is placed in the vagina for three weeks and removed on the fourth week. After that, the process is repeated with a new ring. A prescription is needed to use this method of contraception. Emergency contraceptive Emergency contraceptives prevent pregnancy after unprotected sex. They come in pill form and can be taken up to 5 days after sex. They work best the sooner they are taken after having sex. Most emergency contraceptives are available without a prescription. This method should not be used as your only form of birth control. Barrier methods Female condom  A female condom is a thin sheath that is worn over the penis during sex. Condoms keep sperm from going inside a woman's body. They can be used with a spermicide to increase their effectiveness. They should be disposed after a single use. Female condom  A female condom is a soft, loose-fitting sheath that is put into the vagina before sex. The condom keeps sperm from going inside a woman's body. They should be disposed after a single use. Diaphragm  A diaphragm is a soft, dome-shaped barrier. It is inserted into the vagina before sex, along with a spermicide. The diaphragm blocks sperm from entering the uterus, and the spermicide kills sperm. A diaphragm should be left in the vagina for 6-8 hours after sex and removed within 24 hours. A diaphragm is prescribed and fitted by a health care provider. A diaphragm should be replaced every 1-2 years, after giving birth, after gaining more than 15 lb (6.8 kg), and after pelvic surgery. Cervical cap  A cervical cap is a round, soft latex or plastic cup that fits over the cervix. It is inserted into the vagina before sex, along with spermicide. It blocks sperm from entering the uterus. The cap should be left in place for 6-8 hours after sex and removed within 48 hours. A cervical cap must be prescribed and fitted by a  health care provider. It should be replaced every 2 years. Sponge  A sponge is a soft, circular piece of polyurethane foam with spermicide on it. The sponge helps block sperm from entering the uterus, and the spermicide kills sperm. To use it, you make it wet and then insert it into the vagina. It should be inserted before sex, left in for at least 6 hours after sex, and removed and thrown away within 30 hours. Spermicides Spermicides are chemicals that kill or block sperm from entering the cervix and uterus. They can come as a cream, jelly, suppository, foam, or tablet. A spermicide should be inserted into the vagina with an applicator at least 10-15 minutes before sex to allow time for it to work. The process must be repeated every time you have sex. Spermicides do not require a prescription. Intrauterine contraception Intrauterine device (IUD) An IUD is a  T-shaped device that is put in a woman's uterus. There are two types:  Hormone IUD.This type contains progestin, a synthetic form of the hormone progesterone. This type can stay in place for 3-5 years.  Copper IUD.This type is wrapped in copper wire. It can stay in place for 10 years.  Permanent methods of contraception Female tubal ligation In this method, a woman's fallopian tubes are sealed, tied, or blocked during surgery to prevent eggs from traveling to the uterus. Hysteroscopic sterilization In this method, a small, flexible insert is placed into each fallopian tube. The inserts cause scar tissue to form in the fallopian tubes and block them, so sperm cannot reach an egg. The procedure takes about 3 months to be effective. Another form of birth control must be used during those 3 months. Female sterilization This is a procedure to tie off the tubes that carry sperm (vasectomy). After the procedure, the man can still ejaculate fluid (semen). Natural planning methods Natural family planning In this method, a couple does not have sex on  days when the woman could become pregnant. Calendar method This means keeping track of the length of each menstrual cycle, identifying the days when pregnancy can happen, and not having sex on those days. Ovulation method In this method, a couple avoids sex during ovulation. Symptothermal method This method involves not having sex during ovulation. The woman typically checks for ovulation by watching changes in her temperature and in the consistency of cervical mucus. Post-ovulation method In this method, a couple waits to have sex until after ovulation. Summary  Contraception, also called birth control, means methods or devices that prevent pregnancy.  Hormonal methods of contraception include implants, injections, pills, patches, vaginal rings, and emergency contraceptives.  Barrier methods of contraception can include female condoms, female condoms, diaphragms, cervical caps, sponges, and spermicides.  There are two types of IUDs (intrauterine devices). An IUD can be put in a woman's uterus to prevent pregnancy for 3-5 years.  Permanent sterilization can be done through a procedure for males, females, or both.  Natural family planning methods involve not having sex on days when the woman could become pregnant. This information is not intended to replace advice given to you by your health care provider. Make sure you discuss any questions you have with your health care provider. Document Revised: 03/08/2017 Document Reviewed: 04/08/2016 Elsevier Patient Education  2020 [employer].   Breastfeeding  Choosing to breastfeed is one of the best decisions you can make for yourself and your baby. A change in hormones during pregnancy causes your breasts to make breast milk in your milk-producing glands. Hormones prevent breast milk from being released before your baby is born. They also prompt milk flow after birth. Once breastfeeding has begun, thoughts of your baby, as well as his or her  sucking or crying, can stimulate the release of milk from your milk-producing glands. Benefits of breastfeeding Research shows that breastfeeding offers many health benefits for infants and mothers. It also offers a cost-free and convenient way to feed your baby. For your baby  Your first milk (colostrum) helps your baby's digestive system to function better.  Special cells in your milk (antibodies) help your baby to fight off infections.  Breastfed babies are less likely to develop asthma, allergies, obesity, or type 2 diabetes. They are also at lower risk for sudden infant death syndrome (SIDS).  Nutrients in breast milk are better able to meet your baby's needs compared to infant formula.  Breast milk  improves your baby's brain development. For you  Breastfeeding helps to create a very special bond between you and your baby.  Breastfeeding is convenient. Breast milk costs nothing and is always available at the correct temperature.  Breastfeeding helps to burn calories. It helps you to lose the weight that you gained during pregnancy.  Breastfeeding makes your uterus return faster to its size before pregnancy. It also slows bleeding (lochia) after you give birth.  Breastfeeding helps to lower your risk of developing type 2 diabetes, osteoporosis, rheumatoid arthritis, cardiovascular disease, and breast, ovarian, uterine, and endometrial cancer later in life. Breastfeeding basics Starting breastfeeding  Find a comfortable place to sit or lie down, with your neck and back well-supported.  Place a pillow or a rolled-up blanket under your baby to bring him or her to the level of your breast (if you are seated). Nursing pillows are specially designed to help support your arms and your baby while you breastfeed.  Make sure that your baby's tummy (abdomen) is facing your abdomen.  Gently massage your breast. With your fingertips, massage from the outer edges of your breast inward toward  the nipple. This encourages milk flow. If your milk flows slowly, you may need to continue this action during the feeding.  Support your breast with 4 fingers underneath and your thumb above your nipple (make the letter "C" with your hand). Make sure your fingers are well away from your nipple and your baby's mouth.  Stroke your baby's lips gently with your finger or nipple.  When your baby's mouth is open wide enough, quickly bring your baby to your breast, placing your entire nipple and as much of the areola as possible into your baby's mouth. The areola is the colored area around your nipple. ? More areola should be visible above your baby's upper lip than below the lower lip. ? Your baby's lips should be opened and extended outward (flanged) to ensure an adequate, comfortable latch. ? Your baby's tongue should be between his or her lower gum and your breast.  Make sure that your baby's mouth is correctly positioned around your nipple (latched). Your baby's lips should create a seal on your breast and be turned out (everted).  It is common for your baby to suck about 2-3 minutes in order to start the flow of breast milk. Latching Teaching your baby how to latch onto your breast properly is very important. An improper latch can cause nipple pain, decreased milk supply, and poor weight gain in your baby. Also, if your baby is not latched onto your nipple properly, he or she may swallow some air during feeding. This can make your baby fussy. Burping your baby when you switch breasts during the feeding can help to get rid of the air. However, teaching your baby to latch on properly is still the best way to prevent fussiness from swallowing air while breastfeeding. Signs that your baby has successfully latched onto your nipple  Silent tugging or silent sucking, without causing you pain. Infant's lips should be extended outward (flanged).  Swallowing heard between every 3-4 sucks once your milk has  started to flow (after your let-down milk reflex occurs).  Muscle movement above and in front of his or her ears while sucking. Signs that your baby has not successfully latched onto your nipple  Sucking sounds or smacking sounds from your baby while breastfeeding.  Nipple pain. If you think your baby has not latched on correctly, slip your finger into the  corner of your baby's mouth to break the suction and place it between your baby's gums. Attempt to start breastfeeding again. Signs of successful breastfeeding Signs from your baby  Your baby will gradually decrease the number of sucks or will completely stop sucking.  Your baby will fall asleep.  Your baby's body will relax.  Your baby will retain a small amount of milk in his or her mouth.  Your baby will let go of your breast by himself or herself. Signs from you  Breasts that have increased in firmness, weight, and size 1-3 hours after feeding.  Breasts that are softer immediately after breastfeeding.  Increased milk volume, as well as a change in milk consistency and color by the fifth day of breastfeeding.  Nipples that are not sore, cracked, or bleeding. Signs that your baby is getting enough milk  Wetting at least 1-2 diapers during the first 24 hours after birth.  Wetting at least 5-6 diapers every 24 hours for the first week after birth. The urine should be clear or pale yellow by the age of 5 days.  Wetting 6-8 diapers every 24 hours as your baby continues to grow and develop.  At least 3 stools in a 24-hour period by the age of 5 days. The stool should be soft and yellow.  At least 3 stools in a 24-hour period by the age of 7 days. The stool should be seedy and yellow.  No loss of weight greater than 10% of birth weight during the first 3 days of life.  Average weight gain of 4-7 oz (113-198 g) per week after the age of 4 days.  Consistent daily weight gain by the age of 5 days, without weight loss after  the age of 2 weeks. After a feeding, your baby may spit up a small amount of milk. This is normal. Breastfeeding frequency and duration Frequent feeding will help you make more milk and can prevent sore nipples and extremely full breasts (breast engorgement). Breastfeed when you feel the need to reduce the fullness of your breasts or when your baby shows signs of hunger. This is called "breastfeeding on demand." Signs that your baby is hungry include:  Increased alertness, activity, or restlessness.  Movement of the head from side to side.  Opening of the mouth when the corner of the mouth or cheek is stroked (rooting).  Increased sucking sounds, smacking lips, cooing, sighing, or squeaking.  Hand-to-mouth movements and sucking on fingers or hands.  Fussing or crying. Avoid introducing a pacifier to your baby in the first 4-6 weeks after your baby is born. After this time, you may choose to use a pacifier. Research has shown that pacifier use during the first year of a baby's life decreases the risk of sudden infant death syndrome (SIDS). Allow your baby to feed on each breast as long as he or she wants. When your baby unlatches or falls asleep while feeding from the first breast, offer the second breast. Because newborns are often sleepy in the first few weeks of life, you may need to awaken your baby to get him or her to feed. Breastfeeding times will vary from baby to baby. However, the following rules can serve as a guide to help you make sure that your baby is properly fed:  Newborns (babies 77 weeks of age or younger) may breastfeed every 1-3 hours.  Newborns should not go without breastfeeding for longer than 3 hours during the day or 5 hours during the  night.  You should breastfeed your baby a minimum of 8 times in a 24-hour period. Breast milk pumping     Pumping and storing breast milk allows you to make sure that your baby is exclusively fed your breast milk, even at times when  you are unable to breastfeed. This is especially important if you go back to work while you are still breastfeeding, or if you are not able to be present during feedings. Your lactation consultant can help you find a method of pumping that works best for you and give you guidelines about how long it is safe to store breast milk. Caring for your breasts while you breastfeed Nipples can become dry, cracked, and sore while breastfeeding. The following recommendations can help keep your breasts moisturized and healthy:  Avoid using soap on your nipples.  Wear a supportive bra designed especially for nursing. Avoid wearing underwire-style bras or extremely tight bras (sports bras).  Air-dry your nipples for 3-4 minutes after each feeding.  Use only cotton bra pads to absorb leaked breast milk. Leaking of breast milk between feedings is normal.  Use lanolin on your nipples after breastfeeding. Lanolin helps to maintain your skin's normal moisture barrier. Pure lanolin is not harmful (not toxic) to your baby. You may also hand express a few drops of breast milk and gently massage that milk into your nipples and allow the milk to air-dry. In the first few weeks after giving birth, some women experience breast engorgement. Engorgement can make your breasts feel heavy, warm, and tender to the touch. Engorgement peaks within 3-5 days after you give birth. The following recommendations can help to ease engorgement:  Completely empty your breasts while breastfeeding or pumping. You may want to start by applying warm, moist heat (in the shower or with warm, water-soaked hand towels) just before feeding or pumping. This increases circulation and helps the milk flow. If your baby does not completely empty your breasts while breastfeeding, pump any extra milk after he or she is finished.  Apply ice packs to your breasts immediately after breastfeeding or pumping, unless this is too uncomfortable for you. To do  this: ? Put ice in a plastic bag. ? Place a towel between your skin and the bag. ? Leave the ice on for 20 minutes, 2-3 times a day.  Make sure that your baby is latched on and positioned properly while breastfeeding. If engorgement persists after 48 hours of following these recommendations, contact your health care provider or a Advertising copywriter. Overall health care recommendations while breastfeeding  Eat 3 healthy meals and 3 snacks every day. Well-nourished mothers who are breastfeeding need an additional 450-500 calories a day. You can meet this requirement by increasing the amount of a balanced diet that you eat.  Drink enough water to keep your urine pale yellow or clear.  Rest often, relax, and continue to take your prenatal vitamins to prevent fatigue, stress, and low vitamin and mineral levels in your body (nutrient deficiencies).  Do not use any products that contain nicotine or tobacco, such as cigarettes and e-cigarettes. Your baby may be harmed by chemicals from cigarettes that pass into breast milk and exposure to secondhand smoke. If you need help quitting, ask your health care provider.  Avoid alcohol.  Do not use illegal drugs or marijuana.  Talk with your health care provider before taking any medicines. These include over-the-counter and prescription medicines as well as vitamins and herbal supplements. Some medicines that may be harmful  to your baby can pass through breast milk.  It is possible to become pregnant while breastfeeding. If birth control is desired, ask your health care provider about options that will be safe while breastfeeding your baby. Where to find more information: [employer]: www.llli.org Contact a health care provider if:  You feel like you want to stop breastfeeding or have become frustrated with breastfeeding.  Your nipples are cracked or bleeding.  Your breasts are red, tender, or warm.  You have: ? Painful  breasts or nipples. ? A swollen area on either breast. ? A fever or chills. ? Nausea or vomiting. ? Drainage other than breast milk from your nipples.  Your breasts do not become full before feedings by the fifth day after you give birth.  You feel sad and depressed.  Your baby is: ? Too sleepy to eat well. ? Having trouble sleeping. ? More than 87 week old and wetting fewer than 6 diapers in a 24-hour period. ? Not gaining weight by 8 days of age.  Your baby has fewer than 3 stools in a 24-hour period.  Your baby's skin or the white parts of his or her eyes become yellow. Get help right away if:  Your baby is overly tired (lethargic) and does not want to wake up and feed.  Your baby develops an unexplained fever. Summary  Breastfeeding offers many health benefits for infant and mothers.  Try to breastfeed your infant when he or she shows early signs of hunger.  Gently tickle or stroke your baby's lips with your finger or nipple to allow the baby to open his or her mouth. Bring the baby to your breast. Make sure that much of the areola is in your baby's mouth. Offer one side and burp the baby before you offer the other side.  Talk with your health care provider or lactation consultant if you have questions or you face problems as you breastfeed. This information is not intended to replace advice given to you by your health care provider. Make sure you discuss any questions you have with your health care provider. Document Revised: 05/31/2017 Document Reviewed: 04/07/2016 Elsevier Patient Education  2020 [employer].

## 2020-03-18 NOTE — Progress Notes (Signed)
   Subjective:  Paula Stark is a 29 y.o. K1S0109 at [redacted]w[redacted]d being seen today for ongoing prenatal care.  She is currently monitored for the following issues for this high-risk pregnancy and has Supervision of low-risk pregnancy; Obesity affecting pregnancy; Hemorrhoids during pregnancy in second trimester; and GDM (gestational diabetes mellitus) on their problem list.  Patient reports itching on arms and legs, none on hands or feet.  Contractions: Not present. Vag. Bleeding: None.  Movement: Present. Denies leaking of fluid.   The following portions of the patient's history were reviewed and updated as appropriate: allergies, current medications, past family history, past medical history, past social history, past surgical history and problem list. Problem list updated.  Objective:   Vitals:   03/18/20 1051  BP: 109/82  Pulse: 96    Fetal Status: Fetal Heart Rate (bpm): 156   Movement: Present     General:  Alert, oriented and cooperative. Patient is in no acute distress.  Skin: Skin is warm and dry. No rash noted.   Cardiovascular: Normal heart rate noted  Respiratory: Normal respiratory effort, no problems with respiration noted  Abdomen: Soft, gravid, appropriate for gestational age. Pain/Pressure: Present     Pelvic: Vag. Bleeding: None     Cervical exam deferred        Extremities: Normal range of motion.  Edema: None  Mental Status: Normal mood and affect. Normal behavior. Normal judgment and thought content.   Urinalysis:      Assessment and Plan:  Pregnancy: G5P3013 at [redacted]w[redacted]d  1. Gestational diabetes mellitus (GDM) in third trimester, gestational diabetes method of control unspecified Currently on metforin 500mg  BID Sugars at 74% at goal today, almost exclusively dinnner post prandials which range 104-180 Will increase metforing to 1000g BID, counseled on importance of adhering to diet with dinner time meal, will need to start insulin at next visit if not improved Follows w MFM  regularly Start BPP weekly next week  2. Encounter for supervision of low-risk pregnancy in third trimester BP and FHR normal  3. Obesity affecting pregnancy in third trimester   4. Hemorrhoids during pregnancy in second trimester Refill sent for proctofoam  5. Pruritus Started one week ago Itching on arms and legs but not soles of feet or hands No rash on belly Send CMP and bile acids rx claritin and steroid cream  Preterm labor symptoms and general obstetric precautions including but not limited to vaginal bleeding, contractions, leaking of fluid and fetal movement were reviewed in detail with the patient. Please refer to After Visit Summary for other counseling recommendations.  Return in 2 weeks (on 04/01/2020) for [facility], ob visit.   [facility], MD

## 2020-03-20 NOTE — L&D Delivery Note (Addendum)
OB/GYN Faculty Practice Delivery Note  Paula Stark is a 30 y.o. K0S8110 s/p SVD at [redacted]w[redacted]d. She was admitted for induction of labor for A1GDM.   ROM: 0h 68m with clear fluid GBS Status:  Negative/-- (02/02 0946) Maximum Maternal Temperature: 98  Labor Progress: . Initial SVE: 4cm. She was started on pitocin. She then progressed to complete.   Delivery Date/Time: 2/15 at 19:43 Delivery: Called to room and patient was complete and pushing. Head delivered ROA. No nuchal cord present. Shoulder and body delivered in usual fashion. Small gush of port wine colored blood after delivery of baby. Infant with spontaneous cry, placed on mother's abdomen, dried and stimulated. Cord clamped x 2 after 1-minute delay, and cut by FOB. Cord blood drawn. Placenta delivered spontaneously with gentle cord traction. Fundus firm with massage and Pitocin. Labia, perineum, vagina, and cervix inspected inspected with first degree perineal laceration, repaired. Several large clots expressed with fundal rub, administered 1g TXA.    Baby Weight: pending  Placenta: Sent to L&D Complications: possible small placental abruption  Lacerations: first degree, repaired  EBL: 150 mL Analgesia: Epidural   Infant:  APGAR (1 MIN): 9   APGAR (5 MINS): 9   APGAR (10 MINS):     Casper Harrison, MD [facility] Family Medicine Fellow, [facility] for [facility], [facility] 05/04/2020, 8:05 PM

## 2020-03-22 ENCOUNTER — Ambulatory Visit: Payer: Medicaid Other

## 2020-03-22 ENCOUNTER — Ambulatory Visit (INDEPENDENT_AMBULATORY_CARE_PROVIDER_SITE_OTHER): Payer: Medicaid Other | Admitting: *Deleted

## 2020-03-22 ENCOUNTER — Other Ambulatory Visit: Payer: Self-pay

## 2020-03-22 ENCOUNTER — Ambulatory Visit: Payer: Self-pay

## 2020-03-22 VITALS — BP 111/69 | HR 86 | Wt 184.1 lb

## 2020-03-22 DIAGNOSIS — O24415 Gestational diabetes mellitus in pregnancy, controlled by oral hypoglycemic drugs: Secondary | ICD-10-CM

## 2020-03-22 LAB — COMPREHENSIVE METABOLIC PANEL
ALT: 23 IU/L (ref 0–32)
AST: 25 IU/L (ref 0–40)
Albumin/Globulin Ratio: 1.6 (ref 1.2–2.2)
Albumin: 4 g/dL (ref 3.8–4.8)
Alkaline Phosphatase: 104 IU/L (ref 44–121)
BUN/Creatinine Ratio: 16 (ref 9–23)
BUN: 9 mg/dL (ref 6–20)
Bilirubin Total: <0.2 mg/dL (ref 0.0–1.2)
CO2: 15 mmol/L — ABNORMAL LOW (ref 20–29)
Calcium: 9.7 mg/dL (ref 8.7–10.2)
Chloride: 105 mmol/L (ref 96–106)
Creatinine, Ser: 0.55 mg/dL — ABNORMAL LOW (ref 0.57–1.00)
GFR calc Af Amer: 145 mL/min/{1.73_m2} (ref >59)
GFR calc non Af Amer: 125 mL/min/{1.73_m2} (ref >59)
Globulin, Total: 2.5 g/dL (ref 1.5–4.5)
Glucose: 127 mg/dL — ABNORMAL HIGH (ref 65–99)
Potassium: 3.8 mmol/L (ref 3.5–5.2)
Sodium: 140 mmol/L (ref 134–144)
Total Protein: 6.5 g/dL (ref 6.0–8.5)

## 2020-03-22 LAB — BILE ACIDS, TOTAL: Bile Acids Total: 2.8 umol/L (ref 0.0–10.0)

## 2020-03-22 NOTE — Progress Notes (Signed)

## 2020-03-22 NOTE — Progress Notes (Signed)
  NST:  Baseline: 125 bpm, Variability: Good {> 6 bpm), Accelerations: Reactive and Decelerations: Absent

## 2020-03-24 ENCOUNTER — Telehealth: Payer: Self-pay | Admitting: Family Medicine

## 2020-03-24 ENCOUNTER — Encounter: Payer: Self-pay | Admitting: *Deleted

## 2020-03-24 DIAGNOSIS — U071 COVID-19: Secondary | ICD-10-CM | POA: Insufficient documentation

## 2020-03-24 DIAGNOSIS — O98519 Other viral diseases complicating pregnancy, unspecified trimester: Secondary | ICD-10-CM | POA: Insufficient documentation

## 2020-03-24 NOTE — Telephone Encounter (Signed)
Pt calling in to states she tested +Covid yesterday - symptoms started 03-22-20, pt is 33 weeks preg and states that she had some abd pain yesterday, but would like to talk to someone today. Please call pt ASAP. thanks

## 2020-03-25 ENCOUNTER — Encounter (HOSPITAL_COMMUNITY): Payer: Self-pay | Admitting: Emergency Medicine

## 2020-03-25 ENCOUNTER — Ambulatory Visit (HOSPITAL_COMMUNITY)
Admission: EM | Admit: 2020-03-25 | Discharge: 2020-03-25 | Disposition: A | Discharge status: Home / Self Care | Payer: Medicaid Other | Attending: Family Medicine | Admitting: Family Medicine

## 2020-03-25 ENCOUNTER — Other Ambulatory Visit: Payer: Self-pay

## 2020-03-25 DIAGNOSIS — U071 COVID-19: Secondary | ICD-10-CM | POA: Insufficient documentation

## 2020-03-25 DIAGNOSIS — J069 Acute upper respiratory infection, unspecified: Secondary | ICD-10-CM

## 2020-03-25 NOTE — ED Provider Notes (Signed)
[facility]    CSN: [id number] Arrival date & time: 03/25/20  1421      History   Chief Complaint Chief Complaint  Patient presents with  . URI    HPI Roxana Decaire is a 30 y.o. female.   Here today with 4 day history of fever, congestion, cough, body aches, headaches. Denies CP, SOB, abdominal pain, N/V/D. Taking tylenol with minimal relief. Husband positive for COVID. Of note, she is [redacted] weeks pregnant. No hx of pulmonary dz.      Past Medical History:  Diagnosis Date  . Eczema   . Hemorrhoid   . Postpartum hemorrhage     Patient Active Problem List   Diagnosis Date Noted  . COVID-19 affecting pregnancy, antepartum 03/24/2020  . GDM (gestational diabetes mellitus) 02/18/2020  . Hemorrhoids during pregnancy in second trimester 02/03/2020  . Supervision of low-risk pregnancy 10/30/2019  . Obesity affecting pregnancy 10/30/2019    Past Surgical History:  Procedure Laterality Date  . DILATION AND EVACUATION N/A 04/20/2015   Procedure: DILATATION AND EVACUATION;  Surgeon: Woodroe Mode, MD;  Location: [facility];  Service: Gynecology;  Laterality: N/A;    OB History    Gravida  5   Para  3   Term  3   Preterm  0   AB  1   Living  3     SAB  1   IAB  0   Ectopic  0   Multiple  0   Live Births  3            Home Medications    Prior to Admission medications   Medication Sig Start Date End Date Taking? Authorizing Provider  Accu-Chek Softclix Lancets lancets Use as instructed 02/24/20   Gabriel Carina, CNM  Blood Pressure Monitoring (BLOOD PRESSURE KIT) DEVI 1 Device by Does not apply route daily. ICD 10: Z34.00 01/06/20   Jorje Guild, NP  glucose blood (ACCU-CHEK GUIDE) test strip To check blood sugars 4 times a day. Fasting, and 2 hours after Breakfast, Lunch and Dinner 02/24/20   Gaylan Gerold R, CNM  hydrocortisone 1 % ointment Apply 1 application topically 2 (two) times daily. 03/18/20   Clarnce Flock, MD   hydrocortisone-pramoxine (PROCTOFOAM [facility]) rectal foam Place 1 applicator rectally 2 (two) times daily. 03/18/20   Clarnce Flock, MD  loratadine (CLARITIN) 10 MG tablet Take 1 tablet (10 mg total) by mouth daily. 03/18/20   Clarnce Flock, MD  metFORMIN (GLUCOPHAGE) 500 MG tablet Take 1 tablet (500 mg total) by mouth 2 (two) times daily with a meal. 03/03/20   Clarnce Flock, MD  polyethylene glycol powder (GLYCOLAX/MIRALAX) 17 GM/SCOOP powder Take 17 g by mouth daily as needed. 03/15/20   Clarnce Flock, MD  pramoxine-hydrocortisone cream Apply topically 3 (three) times daily. Patient not taking: Reported on 03/18/2020 03/15/20   Clarnce Flock, MD  Prenat-Fe Poly-Methfol-FA-DHA (VITAFOL ULTRA) 29-0.6-0.4-200 MG CAPS Take 1 capsule by mouth daily. 09/25/19   Julianne Handler, CNM    Family History History reviewed. No pertinent family history.  Social History Social History   Tobacco Use  . Smoking status: Never Smoker  . Smokeless tobacco: Never Used  Vaping Use  . Vaping Use: Never used  Substance Use Topics  . Alcohol use: No  . Drug use: No     Allergies   Patient has no known allergies.   Review of Systems Review of Systems PER HPI  Physical Exam Triage Vital Signs ED Triage Vitals  Enc Vitals Group     BP 03/25/20 1632 117/82     Pulse Rate 03/25/20 1632 83     Resp 03/25/20 1632 16     Temp 03/25/20 1632 98 F (36.7 C)     Temp Source 03/25/20 1632 Oral     SpO2 03/25/20 1632 98 %     Weight --      Height --      Head Circumference --      Peak Flow --      Pain Score 03/25/20 1633 0     Pain Loc --      Pain Edu? --      Excl. in [place]? --    No data found.  Updated Vital Signs BP 117/82 (BP Location: Left Arm)   Pulse 83   Temp 98 F (36.7 C) (Oral)   Resp 16   LMP 08/12/2019   SpO2 98%   Visual Acuity Right Eye Distance:   Left Eye Distance:   Bilateral Distance:    Right Eye Near:   Left Eye Near:    Bilateral  Near:     Physical Exam Vitals and nursing note reviewed.  Constitutional:      Appearance: Normal appearance. She is not ill-appearing.  HENT:     Head: Atraumatic.     Right Ear: Tympanic membrane normal.     Left Ear: Tympanic membrane normal.     Nose: Rhinorrhea present.     Mouth/Throat:     Mouth: Mucous membranes are moist.     Pharynx: Oropharynx is clear. Posterior oropharyngeal erythema present.  Eyes:     Extraocular Movements: Extraocular movements intact.     Conjunctiva/sclera: Conjunctivae normal.  Cardiovascular:     Rate and Rhythm: Normal rate and regular rhythm.     Heart sounds: Normal heart sounds.  Pulmonary:     Effort: Pulmonary effort is normal. No respiratory distress.     Breath sounds: Normal breath sounds. No wheezing or rales.  Musculoskeletal:        General: Normal range of motion.     Cervical back: Normal range of motion and neck supple.  Skin:    General: Skin is warm and dry.  Neurological:     Mental Status: She is alert and oriented to person, place, and time.  Psychiatric:        Mood and Affect: Mood normal.        Thought Content: Thought content normal.        Judgment: Judgment normal.      UC Treatments / Results  Labs (all labs ordered are listed, but only abnormal results are displayed) Labs Reviewed  SARS CORONAVIRUS 2 (TAT 6-24 HRS)    EKG   Radiology No results found.  Procedures Procedures (including critical care time)  Medications Ordered in UC Medications - No data to display  Initial Impression / Assessment and Plan / UC Course  I have reviewed the triage vital signs and the nursing notes.  Pertinent labs & imaging results that were available during my care of the patient were reviewed by me and considered in my medical decision making (see chart for details).     Vitals and exam reassuring today, COVID pcr pending, discussed pregnancy safe OTC remedies, supportive care, strict return precautions.  F/u with OBGYN for recheck as scheduled.  Final Clinical Impressions(s) / UC Diagnoses   Final diagnoses:  Viral  URI with cough     Discharge Instructions     Can take delsym, flonase, tylenol, sudafed unless otherwise directed by your OBGYN    ED Prescriptions    None     PDMP not reviewed this encounter.   Volney American, [place] 03/25/20 1739

## 2020-03-25 NOTE — ED Triage Notes (Signed)
Pt c/o cold sx onset 4 days associated w/cough, runny nose, congestion, fever, body aches, headaches  Denies vomiting, diarrhea  Taking OTC acetaminophen   Also reports husband tested positive for COVID  Pt is [redacted] weeks pregnant.   A&O X4... NAD.Marland Kitchen. ambulatory

## 2020-03-25 NOTE — Discharge Instructions (Signed)
Can take delsym, flonase, tylenol, sudafed unless otherwise directed by your OBGYN

## 2020-03-26 ENCOUNTER — Other Ambulatory Visit: Payer: Medicaid Other

## 2020-03-26 ENCOUNTER — Telehealth: Payer: Self-pay | Admitting: Family Medicine

## 2020-03-26 LAB — SARS CORONAVIRUS 2 (TAT 6-24 HRS): SARS Coronavirus 2: POSITIVE — AB

## 2020-03-26 NOTE — Telephone Encounter (Signed)
Reviewed chart with Dr Crissie Reese who called Dr Judeth Cornfield that stated patient can skip NST/BPP next week due to + covid and follow up with [place] the following week for antenatal testing. Front office informed who called and discussed with patient.

## 2020-03-26 NOTE — Telephone Encounter (Signed)
Pt called Wed and stated she has Covid and abd pain , (Notes in chart). Pt states no one called her back. Pt called this morning per +Covid and she has NST this coming Mon and needs to know what to do. I also advised Nicholaus Bloom and she states that she will let providers know and someone will call pt about appt for Mon.

## 2020-03-29 ENCOUNTER — Encounter: Payer: Medicaid Other | Admitting: Obstetrics and Gynecology

## 2020-03-29 ENCOUNTER — Other Ambulatory Visit: Payer: Medicaid Other

## 2020-03-31 ENCOUNTER — Ambulatory Visit: Payer: Medicaid Other

## 2020-04-01 ENCOUNTER — Encounter: Payer: Medicaid Other | Admitting: Obstetrics and Gynecology

## 2020-04-05 ENCOUNTER — Ambulatory Visit: Payer: Medicaid Other

## 2020-04-07 ENCOUNTER — Ambulatory Visit (INDEPENDENT_AMBULATORY_CARE_PROVIDER_SITE_OTHER): Payer: Medicaid Other | Admitting: *Deleted

## 2020-04-07 ENCOUNTER — Ambulatory Visit (INDEPENDENT_AMBULATORY_CARE_PROVIDER_SITE_OTHER): Payer: Medicaid Other | Admitting: Obstetrics & Gynecology

## 2020-04-07 ENCOUNTER — Encounter: Payer: Medicaid Other | Admitting: Obstetrics & Gynecology

## 2020-04-07 ENCOUNTER — Other Ambulatory Visit: Payer: Self-pay

## 2020-04-07 ENCOUNTER — Ambulatory Visit: Payer: Self-pay

## 2020-04-07 ENCOUNTER — Encounter: Payer: Self-pay | Admitting: Obstetrics & Gynecology

## 2020-04-07 VITALS — BP 105/65 | HR 85 | Wt 180.9 lb

## 2020-04-07 DIAGNOSIS — O24415 Gestational diabetes mellitus in pregnancy, controlled by oral hypoglycemic drugs: Secondary | ICD-10-CM

## 2020-04-07 DIAGNOSIS — O0993 Supervision of high risk pregnancy, unspecified, third trimester: Secondary | ICD-10-CM

## 2020-04-07 DIAGNOSIS — L299 Pruritus, unspecified: Secondary | ICD-10-CM

## 2020-04-07 DIAGNOSIS — O2242 Hemorrhoids in pregnancy, second trimester: Secondary | ICD-10-CM

## 2020-04-07 DIAGNOSIS — Z3A35 35 weeks gestation of pregnancy: Secondary | ICD-10-CM

## 2020-04-07 DIAGNOSIS — O99713 Diseases of the skin and subcutaneous tissue complicating pregnancy, third trimester: Secondary | ICD-10-CM

## 2020-04-07 MED ORDER — HYDROCORTISONE ACETATE 25 MG RE SUPP: 25.0000 mg | Freq: Two times a day (BID) | RECTAL | 1 refills | Status: DC

## 2020-04-07 MED ORDER — HYDROCORTISONE 1 % EX OINT: 1.0000 "application " | TOPICAL_OINTMENT | Freq: Two times a day (BID) | CUTANEOUS | 0 refills | Status: DC

## 2020-04-07 NOTE — Progress Notes (Signed)
Pt had recent Covid.  She reports decreased FM x2 days. [place] for growth & BPP @ MFM on 1/24.  Pt requested refill of hydrocortisone ointment - Rx sent

## 2020-04-07 NOTE — Progress Notes (Signed)

## 2020-04-07 NOTE — Patient Instructions (Signed)
Return to office for any scheduled appointments. Call the office or go to the MAU at [facility] if:  You begin to have strong, frequent contractions  Your water breaks.  Sometimes it is a big gush of fluid, sometimes it is just a trickle that keeps getting your panties wet or running down your legs  You have vaginal bleeding.  It is normal to have a small amount of spotting if your cervix was checked.   You do not feel your baby moving like normal.  If you do not, get something to eat and drink and lay down and focus on feeling your baby move.   If your baby is still not moving like normal, you should call the office or go to MAU.  Any other obstetric concerns.   

## 2020-04-07 NOTE — Progress Notes (Signed)
   PRENATAL VISIT NOTE  Subjective:  Paula Stark is a 30 y.o. Y5K3546 at [redacted]w[redacted]d being seen today for ongoing prenatal care.  She is currently monitored for the following issues for this high-risk pregnancy and has Supervision of high-risk pregnancy; Obesity affecting pregnancy; Hemorrhoids during pregnancy in second trimester; GDM (gestational diabetes mellitus); and COVID-19 affecting pregnancy, antepartum on their problem list.  Patient reports slightly decreased fetal movement for the past couple of days. Also desires more medication for her symptomatic hemorrhoids, and refill for the hydrocortisone she uses for dermatitis.  Contractions: Not present. Vag. Bleeding: None.  Movement: (!) Decreased. Denies leaking of fluid.   The following portions of the patient's history were reviewed and updated as appropriate: allergies, current medications, past family history, past medical history, past social history, past surgical history and problem list.   Objective:   Vitals:   04/07/20 0915  BP: 105/65  Pulse: 85  Weight: 180 lb 14.4 oz (82.1 kg)    Fetal Status: Fetal Heart Rate (bpm): NST   Movement: (!) Decreased     General:  Alert, oriented and cooperative. Patient is in no acute distress.  Skin: Skin is warm and dry. No rash noted.   Cardiovascular: Normal heart rate noted  Respiratory: Normal respiratory effort, no problems with respiration noted  Abdomen: Soft, gravid, appropriate for gestational age.  Pain/Pressure: Present     Pelvic: Cervical exam deferred        Extremities: Normal range of motion.  Edema: None  Mental Status: Normal mood and affect. Normal behavior. Normal judgment and thought content.   Assessment and Plan:  Pregnancy: F6C1275 at [redacted]w[redacted]d 1. Gestational diabetes mellitus (GDM) in third trimester controlled on oral hypoglycemic drug Reviewed blood sugars on paper log, had 3 abnormal dinner postprandials in 130-160, rest are normal.  Continue Metformin 500 mg bid  for now.  NST performed today was reviewed and was found to be reactive. Subsequent BPP performed today was also reviewed and was found to be 10/10. AFI was also normal. Continue recommended antenatal testing, growth scan and prenatal care.  Discussed that delivery is recommended at 39 weeks, pending any other complications.  2. Hemorrhoids during pregnancy in second trimester Anusol prescribed, will monitor response. - hydrocortisone (ANUSOL-HC) 25 MG suppository; Place 1 suppository (25 mg total) rectally 2 (two) times daily.  Dispense: 24 suppository; Refill: 1  3. Pruritus of pregnancy in third trimester Refill ordered. - hydrocortisone 1 % ointment; Apply 1 application topically 2 (two) times daily.  Dispense: 30 g; Refill: 0  4. [redacted] weeks gestation of pregnancy 5. Supervision of high risk pregnancy in third trimester She was informed of need for pelvic cultures next visit.  Preterm labor symptoms and general obstetric precautions including but not limited to vaginal bleeding, contractions, leaking of fluid and fetal movement were reviewed in detail with the patient. Please refer to After Visit Summary for other counseling recommendations.   Return in about 5 days (around 04/12/2020) for as scheduled; Needs NST/BPP and HOB on 1/31 or 2/2.  Future Appointments  Date Time Provider Department Center  04/12/2020  8:15 AM [facility] NST [facility]  04/12/2020  9:15 AM Gita Kudo, MD [facility]  04/12/2020  9:45 AM [facility] US[facility]  04/21/2020  9:15 AM Johnney Scarlata, Jethro Bastos, MD [facility]    Jaynie Collins, MD

## 2020-04-12 ENCOUNTER — Other Ambulatory Visit: Payer: Self-pay | Admitting: *Deleted

## 2020-04-12 ENCOUNTER — Ambulatory Visit (INDEPENDENT_AMBULATORY_CARE_PROVIDER_SITE_OTHER): Payer: Medicaid Other | Admitting: *Deleted

## 2020-04-12 ENCOUNTER — Ambulatory Visit: Payer: Medicaid Other | Admitting: *Deleted

## 2020-04-12 ENCOUNTER — Ambulatory Visit: Payer: Medicaid Other | Attending: Obstetrics and Gynecology

## 2020-04-12 ENCOUNTER — Other Ambulatory Visit: Payer: Self-pay

## 2020-04-12 ENCOUNTER — Encounter: Payer: Self-pay | Admitting: *Deleted

## 2020-04-12 ENCOUNTER — Ambulatory Visit (INDEPENDENT_AMBULATORY_CARE_PROVIDER_SITE_OTHER): Payer: Medicaid Other | Admitting: Obstetrics and Gynecology

## 2020-04-12 VITALS — BP 114/76 | HR 78 | Wt 182.6 lb

## 2020-04-12 DIAGNOSIS — U071 COVID-19: Secondary | ICD-10-CM | POA: Insufficient documentation

## 2020-04-12 DIAGNOSIS — O0993 Supervision of high risk pregnancy, unspecified, third trimester: Secondary | ICD-10-CM

## 2020-04-12 DIAGNOSIS — Z3A35 35 weeks gestation of pregnancy: Secondary | ICD-10-CM

## 2020-04-12 DIAGNOSIS — O98519 Other viral diseases complicating pregnancy, unspecified trimester: Secondary | ICD-10-CM | POA: Diagnosis present

## 2020-04-12 DIAGNOSIS — O24415 Gestational diabetes mellitus in pregnancy, controlled by oral hypoglycemic drugs: Secondary | ICD-10-CM | POA: Insufficient documentation

## 2020-04-12 DIAGNOSIS — O99213 Obesity complicating pregnancy, third trimester: Secondary | ICD-10-CM

## 2020-04-12 MED ORDER — DOCUSATE SODIUM 100 MG PO CAPS: 100.0000 mg | ORAL_CAPSULE | Freq: Two times a day (BID) | ORAL | 2 refills | Status: DC | PRN

## 2020-04-12 MED ORDER — DOCUSATE SODIUM 100 MG PO CAPS: 100.0000 mg | ORAL_CAPSULE | Freq: Two times a day (BID) | ORAL | 2 refills | Status: DC

## 2020-04-12 NOTE — Patient Instructions (Signed)
Nonsurgical Procedures for Hemorrhoids, Care After This sheet gives you information about how to care for yourself after your procedure. Your health care provider may also give you more specific instructions. If you have problems or questions, contact your health care provider. What can I expect after the procedure? After the procedure, it is common to have:  Slight rectal bleeding for a few days.  Soreness or a dull ache in the rectal area. Follow these instructions at home: Medicines  Take over-the-counter and prescription medicines only as told by your health care provider.  Use a stool softener or a bulk laxative as told by your health care provider. Activity  Return to your normal activities as told by your health care provider. Ask your health care provider what activities are safe for you.  Do not lift anything that is heavier than 10 lb (4.5 kg), or the limit that you are told, until your health care provider says that it is safe.  Do not sit for long periods of time without moving. Take a walk every day or as told by your health care provider.   Managing pain and swelling  Take warm sitz baths for 20 minutes, 3-4 times a day to ease pain and discomfort. You may do this in a bathtub or using a portable sitz bath that fits over the toilet.  If directed, apply ice to the affected area. Using ice packs between sitz baths may be helpful. ? Put ice in a plastic bag. ? Place a towel between your skin and the bag. ? Leave the ice on for 20 minutes, 2-3 times a day. Eating and drinking  Eat foods that have a lot of fiber in them, such as whole grains, beans, nuts, fruits, and vegetables.  Drink enough fluid to keep your urine pale yellow.   General instructions  Do not strain to have a bowel movement.  Do not spend a long time sitting on the toilet.  Keep all follow-up visits as told by your health care provider. This is important. Contact a health care provider if:  Your  pain medicine is not helping.  You have a fever.  You become constipated.  You continue to have light rectal bleeding for more than a few days.  You are unable to pass urine (urinary retention). Get help right away if you have:  Very bad rectal pain.  Heavy bleeding from your rectum. Summary  After the procedure, it is common to have slight rectal bleeding and soreness in the area.  Taking warm sitz baths and applying ice may be helpful to relieve the discomfort.  Eat foods that have a lot of fiber in them, such as whole grains, beans, nuts, fruits, and vegetables.  Get help right away if you have excessive pain or heavy bleeding from your rectum. This information is not intended to replace advice given to you by your health care provider. Make sure you discuss any questions you have with your health care provider. Document Revised: 06/28/2018 Document Reviewed: 07/26/2017 Elsevier Patient Education  2021 [employer].

## 2020-04-12 NOTE — Progress Notes (Signed)
Pt reports she could not get the Anusol suppositories d/t not covered by Medicaid - cost was $345.  [place] for growth and BPP today.

## 2020-04-12 NOTE — Progress Notes (Signed)
   PRENATAL VISIT NOTE  Subjective:  Paula Stark is a 30 y.o. D5H2992 at [redacted]w[redacted]d being seen today for ongoing prenatal care.  She is currently monitored for the following issues for this high-risk pregnancy and has Supervision of high-risk pregnancy; Obesity affecting pregnancy; Hemorrhoids during pregnancy in second trimester; GDM (gestational diabetes mellitus); and COVID-19 affecting pregnancy, antepartum on their problem list.  Patient reports issues with hemorrhoids. Using cream daily, uses miralax prn. Have continued to get worse - anusol not covered by insurance.  Contractions: Not present. Vag. Bleeding: None.  Movement: Present. Denies leaking of fluid.   Review blood glucose logs - all AM CBG appropriate, has had some elevated readings particularly with dinner, but overall has been appropriate.   The following portions of the patient's history were reviewed and updated as appropriate: allergies, current medications, past family history, past medical history, past social history, past surgical history and problem list.   Objective:   Vitals:   04/12/20 0838  BP: 114/76  Pulse: 78  Weight: 182 lb 9.6 oz (82.8 kg)    Fetal Status: Fetal Heart Rate (bpm): NST   Movement: Present     General:  Alert, oriented and cooperative. Patient is in no acute distress.  Skin: Skin is warm and dry. No rash noted.   Cardiovascular: Normal heart rate noted  Respiratory: Normal respiratory effort, no problems with respiration noted  Abdomen: Soft, gravid, appropriate for gestational age.  Pain/Pressure: Present     Pelvic: Cervical exam deferred        Extremities: Normal range of motion.  Edema: None  Mental Status: Normal mood and affect. Normal behavior. Normal judgment and thought content.   Assessment and Plan:  Pregnancy: [id number] at [redacted]w[redacted]d 1. Supervision of high risk pregnancy in third trimester -here for NST and BPP, return in one week for repeat testing and ROB. Plan for IOL at 39 weeks.    2. Gestational diabetes mellitus (GDM) in third trimester controlled on oral hypoglycemic drug Continue metformin, discussed strategies for decreasing blood glucose levels around dinner time. Patient verbalizes understanding   3. Obesity affecting pregnancy in third trimester   4. [redacted] weeks gestation of pregnancy  5. Hemorrhoids -recommend starting daily colace, miralax. Discussed OTC remedies she can trial.   Term labor symptoms and general obstetric precautions including but not limited to vaginal bleeding, contractions, leaking of fluid and fetal movement were reviewed in detail with the patient. Please refer to After Visit Summary for other counseling recommendations.   Return in about 9 days (around 04/21/2020) for as scheduled;  2/9 - Needs HOB and NST/BPP.  Future Appointments  Date Time Provider Department Center  04/12/2020  9:15 AM Gita Kudo, MD [facility]  04/12/2020  9:30 AM [facility] NURSE [facility]  04/12/2020  9:45 AM [facility] US5 [facility]  04/21/2020  8:15 AM [facility] NST [facility]  04/21/2020  9:15 AM Anyanwu, Jethro Bastos, MD [facility]    Gita Kudo, MD

## 2020-04-12 NOTE — Addendum Note (Signed)
Addended byCasper Harrison on: 04/12/2020 09:46 AM   Modules accepted: Level of Service

## 2020-04-19 ENCOUNTER — Other Ambulatory Visit: Payer: Self-pay

## 2020-04-19 ENCOUNTER — Ambulatory Visit: Payer: Medicaid Other | Attending: Obstetrics

## 2020-04-19 ENCOUNTER — Ambulatory Visit: Payer: Medicaid Other | Admitting: *Deleted

## 2020-04-19 DIAGNOSIS — U071 COVID-19: Secondary | ICD-10-CM | POA: Insufficient documentation

## 2020-04-19 DIAGNOSIS — O24415 Gestational diabetes mellitus in pregnancy, controlled by oral hypoglycemic drugs: Secondary | ICD-10-CM | POA: Diagnosis not present

## 2020-04-19 DIAGNOSIS — O98519 Other viral diseases complicating pregnancy, unspecified trimester: Secondary | ICD-10-CM | POA: Insufficient documentation

## 2020-04-21 ENCOUNTER — Other Ambulatory Visit: Payer: Self-pay

## 2020-04-21 ENCOUNTER — Ambulatory Visit (INDEPENDENT_AMBULATORY_CARE_PROVIDER_SITE_OTHER): Payer: Medicaid Other | Admitting: *Deleted

## 2020-04-21 ENCOUNTER — Ambulatory Visit (INDEPENDENT_AMBULATORY_CARE_PROVIDER_SITE_OTHER): Payer: Medicaid Other | Admitting: Obstetrics and Gynecology

## 2020-04-21 ENCOUNTER — Other Ambulatory Visit (HOSPITAL_COMMUNITY)
Admission: RE | Admit: 2020-04-21 | Discharge: 2020-04-21 | Disposition: A | Discharge status: Home / Self Care | Payer: Medicaid Other | Source: Ambulatory Visit | Attending: Obstetrics & Gynecology | Admitting: Obstetrics & Gynecology

## 2020-04-21 ENCOUNTER — Other Ambulatory Visit: Payer: Self-pay | Admitting: Obstetrics and Gynecology

## 2020-04-21 VITALS — BP 115/77 | HR 75 | Wt 184.4 lb

## 2020-04-21 DIAGNOSIS — O0993 Supervision of high risk pregnancy, unspecified, third trimester: Secondary | ICD-10-CM | POA: Diagnosis not present

## 2020-04-21 DIAGNOSIS — Z3A37 37 weeks gestation of pregnancy: Secondary | ICD-10-CM

## 2020-04-21 DIAGNOSIS — O24415 Gestational diabetes mellitus in pregnancy, controlled by oral hypoglycemic drugs: Secondary | ICD-10-CM

## 2020-04-21 DIAGNOSIS — O99213 Obesity complicating pregnancy, third trimester: Secondary | ICD-10-CM

## 2020-04-21 NOTE — Progress Notes (Signed)
[place] for growth done 1/24

## 2020-04-21 NOTE — Progress Notes (Signed)
Pt had BPP 8/8 on 1/31 @ [facility].  NST only needed today per Dr. Myriam Jacobson.

## 2020-04-21 NOTE — Progress Notes (Signed)
   PRENATAL VISIT NOTE  Subjective:  Paula Stark is a 30 y.o. [id number] at [redacted]w[redacted]d being seen today for ongoing prenatal care.  She is currently monitored for the following issues for this high-risk pregnancy and has Supervision of high-risk pregnancy; Obesity affecting pregnancy; Hemorrhoids during pregnancy in second trimester; GDM (gestational diabetes mellitus); and COVID-19 affecting pregnancy, antepartum on their problem list.  Patient reports no complaints.  Contractions: Irregular. Vag. Bleeding: None.  Movement: Present. Denies leaking of fluid.   Did not bring sugar log today. Reports overall good control. Says that she has had a few post prandial >120, highest was 165 but states otherwise has been in appropriate control.   The following portions of the patient's history were reviewed and updated as appropriate: allergies, current medications, past family history, past medical history, past social history, past surgical history and problem list.   Objective:   Vitals:   04/21/20 0852  BP: 115/77  Pulse: 75  Weight: 184 lb 6.4 oz (83.6 kg)    Fetal Status: Fetal Heart Rate (bpm): NST   Movement: Present     General:  Alert, oriented and cooperative. Patient is in no acute distress.  Skin: Skin is warm and dry. No rash noted.   Cardiovascular: Normal heart rate noted  Respiratory: Normal respiratory effort, no problems with respiration noted  Abdomen: Soft, gravid, appropriate for gestational age.  Pain/Pressure: Absent     Pelvic: Cervical exam deferred        Extremities: Normal range of motion.  Edema: None  Mental Status: Normal mood and affect. Normal behavior. Normal judgment and thought content.   Assessment and Plan:  Pregnancy: [id number] at [redacted]w[redacted]d 1. Supervision of high risk pregnancy in third trimester -induction scheduled for 39 weeks, 2/15.  - GC/Chlamydia probe amp ([place])not at [facility] - Strep Gp B NAA  2. Gestational diabetes mellitus (GDM) in third trimester  controlled on oral hypoglycemic drug -overall reports appropriate CBG but did not bring log. Counseled on importance of glycemic control, will bring log next week.  -NST reactive   3. [redacted] weeks gestation of pregnancy -swabs obtained  4. Obesity affecting pregnancy in third trimester   Term labor symptoms and general obstetric precautions including but not limited to vaginal bleeding, contractions, leaking of fluid and fetal movement were reviewed in detail with the patient. Please refer to After Visit Summary for other counseling recommendations.   Return in about 1 week (around 04/28/2020) for [facility] and NST/BPP as scheduled.  Future Appointments  Date Time Provider Department Center  04/21/2020  9:15 AM Gita Kudo, MD [facility]  04/28/2020  9:15 AM [facility] NST [facility]  04/28/2020 10:15 AM Adam Phenix, MD [facility]  05/05/2020  9:15 AM [facility] NST [facility]  05/05/2020 10:15 AM Venora Maples, MD [facility]  05/12/2020  9:15 AM [facility] NST [facility]  05/12/2020 10:15 AM  Bing, MD [facility]  05/19/2020  9:15 AM [facility] NST [facility]  05/19/2020 10:15 AM Conan Bowens, MD [facility]    Gita Kudo, MD

## 2020-04-22 LAB — GC/CHLAMYDIA PROBE AMP (~~LOC~~) NOT AT ARMC
Chlamydia: NEGATIVE
Comment: NEGATIVE
Comment: NORMAL
Neisseria Gonorrhea: NEGATIVE

## 2020-04-23 LAB — STREP GP B NAA: Strep Gp B NAA: NEGATIVE

## 2020-04-26 ENCOUNTER — Ambulatory Visit: Payer: Medicaid Other

## 2020-04-28 ENCOUNTER — Other Ambulatory Visit: Payer: Self-pay

## 2020-04-28 ENCOUNTER — Telehealth (HOSPITAL_COMMUNITY): Payer: Self-pay | Admitting: *Deleted

## 2020-04-28 ENCOUNTER — Ambulatory Visit: Payer: Self-pay

## 2020-04-28 ENCOUNTER — Encounter (HOSPITAL_COMMUNITY): Payer: Self-pay | Admitting: *Deleted

## 2020-04-28 ENCOUNTER — Other Ambulatory Visit: Payer: Self-pay | Admitting: Advanced Practice Midwife

## 2020-04-28 ENCOUNTER — Ambulatory Visit (INDEPENDENT_AMBULATORY_CARE_PROVIDER_SITE_OTHER): Payer: Medicaid Other | Admitting: *Deleted

## 2020-04-28 ENCOUNTER — Ambulatory Visit (INDEPENDENT_AMBULATORY_CARE_PROVIDER_SITE_OTHER): Payer: Medicaid Other | Admitting: Obstetrics & Gynecology

## 2020-04-28 VITALS — BP 117/76 | HR 82 | Wt 185.7 lb

## 2020-04-28 DIAGNOSIS — O24415 Gestational diabetes mellitus in pregnancy, controlled by oral hypoglycemic drugs: Secondary | ICD-10-CM

## 2020-04-28 DIAGNOSIS — O0993 Supervision of high risk pregnancy, unspecified, third trimester: Secondary | ICD-10-CM

## 2020-04-28 DIAGNOSIS — K649 Unspecified hemorrhoids: Secondary | ICD-10-CM

## 2020-04-28 DIAGNOSIS — O2242 Hemorrhoids in pregnancy, second trimester: Secondary | ICD-10-CM

## 2020-04-28 MED ORDER — LIDOCAINE 5 % EX OINT: 1.0000 "application " | TOPICAL_OINTMENT | CUTANEOUS | 6 refills | Status: DC | PRN

## 2020-04-28 NOTE — Patient Instructions (Signed)
Labor Induction Labor induction is when steps are taken to cause a pregnant woman to begin the labor process. Most women go into labor on their own between 37 weeks and 42 weeks of pregnancy. When this does not happen, or when there is a medical need for labor to begin, steps may be taken to induce, or bring on, labor. Labor induction causes a pregnant woman's uterus to contract. It also causes the cervix to soften (ripen), open (dilate), and thin out. Usually, labor is not induced before 39 weeks of pregnancy unless there is a medical reason to do so. When is labor induction considered? Labor induction may be right for you if:  Your pregnancy lasts longer than 41 to 42 weeks.  Your placenta is separating from your uterus (placental abruption).  You have a rupture of membranes and your labor does not begin.  You have health problems, like diabetes or high blood pressure (preeclampsia) during your pregnancy.  Your baby has stopped growing or does not have enough amniotic fluid. Before labor induction begins, your health care provider will consider the following factors:  Your medical condition and the baby's condition.  How many weeks you have been pregnant.  How mature the baby's lungs are.  The condition of your cervix.  The position of the baby.  The size of your birth canal. Tell a health care provider about:  Any allergies you have.  All medicines you are taking, including vitamins, herbs, eye drops, creams, and over-the-counter medicines.  Any problems you or your family members have had with anesthetic medicines.  Any surgeries you have had.  Any blood disorders you have.  Any medical conditions you have. What are the risks? Generally, this is a safe procedure. However, problems may occur, including:  Failed induction.  Changes in fetal heart rate, such as being too high, too low, or irregular (erratic).  Infection in the mother or the baby.  Increased risk of  having a cesarean delivery.  Breaking off (abruption) of the placenta from the uterus. This is rare.  Rupture of the uterus. This is very rare.  Your baby could fail to get enough blood flow or oxygen. This can be life-threatening. When induction is needed for medical reasons, the benefits generally outweigh the risks. What happens during the procedure? During the procedure, your health care provider will use one of these methods to induce labor:  Stripping the membranes. In this method, the amniotic sac tissue is gently separated from the cervix. This causes the following to happen: ? Your cervix stretches, which in turn causes the release of prostaglandins. ? Prostaglandins induce labor and cause the uterus to contract. ? This procedure is often done in an office visit. You will be sent home to wait for contractions to begin.  Prostaglandin medicine. This medicine starts contractions and causes the cervix to dilate and ripen. This can be taken by mouth (orally) or by being inserted into the vagina (suppository).  Inserting a small, thin tube (catheter) with a balloon into the vagina and then expanding the balloon with water to dilate the cervix.  Breaking the water. In this method, a small instrument is used to make a small hole in the amniotic sac. This eventually causes the amniotic sac to break. Contractions should begin within a few hours.  Medicine to trigger or strengthen contractions. This medicine is given through an IV that is inserted into a vein in your arm. This procedure may vary among health care providers and hospitals.     Where to find more information  March of Dimes: www.marchofdimes.org  The American College of Obstetricians and Gynecologists: www.acog.org Summary  Labor induction causes a pregnant woman's uterus to contract. It also causes the cervix to soften (ripen), open (dilate), and thin out.  Labor is usually not induced before 39 weeks of pregnancy unless  there is a medical reason to do so.  When induction is needed for medical reasons, the benefits generally outweigh the risks.  Talk with your health care provider about which methods of labor induction are right for you. This information is not intended to replace advice given to you by your health care provider. Make sure you discuss any questions you have with your health care provider. Document Revised: 12/18/2019 Document Reviewed: 12/18/2019 Elsevier Patient Education  2021 Elsevier Inc.  

## 2020-04-28 NOTE — Progress Notes (Signed)
Pt reports decreased FM x1 week.  IOL scheduled on 2/15.

## 2020-04-28 NOTE — Progress Notes (Signed)
   PRENATAL VISIT NOTE  Subjective:  Paula Stark is a 30 y.o. [id number] at [redacted]w[redacted]d being seen today for ongoing prenatal care.  She is currently monitored for the following issues for this high-risk pregnancy and has Supervision of high-risk pregnancy; Obesity affecting pregnancy; Hemorrhoids during pregnancy in second trimester; GDM (gestational diabetes mellitus); and COVID-19 affecting pregnancy, antepartum on their problem list.  Patient reports occasional contractions.  Contractions: Not present. Vag. Bleeding: None.  Movement: (!) Decreased. Denies leaking of fluid.   The following portions of the patient's history were reviewed and updated as appropriate: allergies, current medications, past family history, past medical history, past social history, past surgical history and problem list.   Objective:   Vitals:   04/28/20 0943  BP: 117/76  Pulse: 82  Weight: 185 lb 11.2 oz (84.2 kg)    Fetal Status: Fetal Heart Rate (bpm): NST   Movement: (!) Decreased     General:  Alert, oriented and cooperative. Patient is in no acute distress.  Skin: Skin is warm and dry. No rash noted.   Cardiovascular: Normal heart rate noted  Respiratory: Normal respiratory effort, no problems with respiration noted  Abdomen: Soft, gravid, appropriate for gestational age.  Pain/Pressure: Present     Pelvic: Cervical exam deferred        Extremities: Normal range of motion.  Edema: None  Mental Status: Normal mood and affect. Normal behavior. Normal judgment and thought content.   Assessment and Plan:  Pregnancy: U2P5361 at [redacted]w[redacted]d 1. Supervision of high risk pregnancy in third trimester BPP 10/10 today  2. Gestational diabetes mellitus (GDM) in third trimester controlled on oral hypoglycemic drug blood glucose on metformin good control  3. Hemorrhoids during pregnancy in second trimester Lidocaine refilled  4. Hemorrhoids, unspecified hemorrhoid type  - lidocaine (XYLOCAINE) 5 % ointment; Apply 1  application topically as needed.  Dispense: 35.44 g; Refill: 6  Term labor symptoms and general obstetric precautions including but not limited to vaginal bleeding, contractions, leaking of fluid and fetal movement were reviewed in detail with the patient. Please refer to After Visit Summary for other counseling recommendations.   Return if symptoms worsen or fail to improve.  Future Appointments  Date Time Provider Department Center  05/04/2020  7:45 AM [facility] SCHED ROOM [facility] None    Scheryl Darter, MD

## 2020-04-28 NOTE — Telephone Encounter (Signed)
Preadmission screen  

## 2020-05-03 ENCOUNTER — Other Ambulatory Visit (HOSPITAL_COMMUNITY): Payer: Medicaid Other

## 2020-05-03 NOTE — Progress Notes (Signed)
Pt not tested for covid today(05/03/20) due to pt testing + for covid on 03/25/20(results in Epic). Based on the guidelines the pt is in the 90 day window to not retest. The pt is still expected to quarantine until their procedure. Therefore, the pt can still have the scheduled procedure.

## 2020-05-04 ENCOUNTER — Inpatient Hospital Stay (HOSPITAL_COMMUNITY): Payer: Medicaid Other | Admitting: Anesthesiology

## 2020-05-04 ENCOUNTER — Inpatient Hospital Stay (HOSPITAL_COMMUNITY): Payer: Medicaid Other

## 2020-05-04 ENCOUNTER — Other Ambulatory Visit: Payer: Self-pay

## 2020-05-04 ENCOUNTER — Inpatient Hospital Stay (HOSPITAL_COMMUNITY)
Admission: AD | Admit: 2020-05-04 | Discharge: 2020-05-06 | DRG: 805 | Disposition: A | Discharge status: Home / Self Care | Payer: Medicaid Other | Attending: Obstetrics and Gynecology | Admitting: Obstetrics and Gynecology

## 2020-05-04 ENCOUNTER — Encounter (HOSPITAL_COMMUNITY): Payer: Self-pay | Admitting: Obstetrics & Gynecology

## 2020-05-04 DIAGNOSIS — O24425 Gestational diabetes mellitus in childbirth, controlled by oral hypoglycemic drugs: Secondary | ICD-10-CM | POA: Diagnosis not present

## 2020-05-04 DIAGNOSIS — E669 Obesity, unspecified: Secondary | ICD-10-CM | POA: Diagnosis not present

## 2020-05-04 DIAGNOSIS — U071 COVID-19: Secondary | ICD-10-CM | POA: Diagnosis present

## 2020-05-04 DIAGNOSIS — O24419 Gestational diabetes mellitus in pregnancy, unspecified control: Secondary | ICD-10-CM | POA: Diagnosis present

## 2020-05-04 DIAGNOSIS — K649 Unspecified hemorrhoids: Secondary | ICD-10-CM

## 2020-05-04 DIAGNOSIS — O98519 Other viral diseases complicating pregnancy, unspecified trimester: Secondary | ICD-10-CM | POA: Diagnosis present

## 2020-05-04 DIAGNOSIS — O2243 Hemorrhoids in pregnancy, third trimester: Secondary | ICD-10-CM | POA: Diagnosis not present

## 2020-05-04 DIAGNOSIS — O4593 Premature separation of placenta, unspecified, third trimester: Secondary | ICD-10-CM | POA: Diagnosis not present

## 2020-05-04 DIAGNOSIS — Z3A39 39 weeks gestation of pregnancy: Secondary | ICD-10-CM | POA: Diagnosis not present

## 2020-05-04 DIAGNOSIS — O24429 Gestational diabetes mellitus in childbirth, unspecified control: Secondary | ICD-10-CM | POA: Diagnosis not present

## 2020-05-04 DIAGNOSIS — Z3490 Encounter for supervision of normal pregnancy, unspecified, unspecified trimester: Secondary | ICD-10-CM | POA: Diagnosis present

## 2020-05-04 DIAGNOSIS — O24420 Gestational diabetes mellitus in childbirth, diet controlled: Secondary | ICD-10-CM | POA: Diagnosis not present

## 2020-05-04 DIAGNOSIS — O99214 Obesity complicating childbirth: Secondary | ICD-10-CM | POA: Diagnosis not present

## 2020-05-04 DIAGNOSIS — Z8616 Personal history of COVID-19: Secondary | ICD-10-CM | POA: Diagnosis not present

## 2020-05-04 DIAGNOSIS — O2242 Hemorrhoids in pregnancy, second trimester: Secondary | ICD-10-CM

## 2020-05-04 DIAGNOSIS — O0990 Supervision of high risk pregnancy, unspecified, unspecified trimester: Secondary | ICD-10-CM

## 2020-05-04 DIAGNOSIS — Z8632 Personal history of gestational diabetes: Secondary | ICD-10-CM | POA: Diagnosis present

## 2020-05-04 LAB — CBC
HCT: 39.5 % (ref 36.0–46.0)
Hemoglobin: 12.7 g/dL (ref 12.0–15.0)
MCH: 27.1 pg (ref 26.0–34.0)
MCHC: 32.2 g/dL (ref 30.0–36.0)
MCV: 84.2 fL (ref 80.0–100.0)
Platelets: 206 10*3/uL (ref 150–400)
RBC: 4.69 MIL/uL (ref 3.87–5.11)
RDW: 13.9 % (ref 11.5–15.5)
WBC: 11.3 10*3/uL — ABNORMAL HIGH (ref 4.0–10.5)
nRBC: 0 % (ref 0.0–0.2)

## 2020-05-04 LAB — TYPE AND SCREEN
ABO/RH(D): O POS
Antibody Screen: NEGATIVE

## 2020-05-04 LAB — GLUCOSE, CAPILLARY
Glucose-Capillary: 80 mg/dL (ref 70–99)
Glucose-Capillary: 85 mg/dL (ref 70–99)

## 2020-05-04 MED ORDER — BENZOCAINE-MENTHOL 20-0.5 % EX AERO
1.0000 "application " | INHALATION_SPRAY | CUTANEOUS | Status: DC | PRN
  Administered 2020-05-04 – 2020-05-06 (×2): 1 via TOPICAL
  Filled 2020-05-04 (×2): qty 56

## 2020-05-04 MED ORDER — LACTATED RINGERS IV SOLN: 500.0000 mL | INTRAVENOUS | Status: DC | PRN

## 2020-05-04 MED ORDER — DIBUCAINE (PERIANAL) 1 % EX OINT
1.0000 "application " | TOPICAL_OINTMENT | CUTANEOUS | Status: DC | PRN
  Administered 2020-05-04: 1 via RECTAL
  Filled 2020-05-04: qty 28

## 2020-05-04 MED ORDER — TRANEXAMIC ACID-NACL 1000-0.7 MG/100ML-% IV SOLN
INTRAVENOUS | Status: AC
  Filled 2020-05-04: qty 100

## 2020-05-04 MED ORDER — TRANEXAMIC ACID-NACL 1000-0.7 MG/100ML-% IV SOLN
1000.0000 mg | Freq: Once | INTRAVENOUS | Status: AC
  Administered 2020-05-04: 1000 mg via INTRAVENOUS

## 2020-05-04 MED ORDER — DIPHENHYDRAMINE HCL 25 MG PO CAPS: 25.0000 mg | ORAL_CAPSULE | Freq: Four times a day (QID) | ORAL | Status: DC | PRN

## 2020-05-04 MED ORDER — SENNOSIDES-DOCUSATE SODIUM 8.6-50 MG PO TABS
2.0000 | ORAL_TABLET | ORAL | Status: DC
  Administered 2020-05-05 – 2020-05-06 (×2): 2 via ORAL
  Filled 2020-05-04 (×2): qty 2

## 2020-05-04 MED ORDER — ONDANSETRON HCL 4 MG PO TABS: 4.0000 mg | ORAL_TABLET | ORAL | Status: DC | PRN

## 2020-05-04 MED ORDER — HYDROCORT-PRAMOXINE (PERIANAL) 1-1 % EX FOAM
1.0000 | Freq: Two times a day (BID) | CUTANEOUS | Status: DC
  Administered 2020-05-04 – 2020-05-06 (×4): 1 via RECTAL
  Filled 2020-05-04 (×4): qty 10

## 2020-05-04 MED ORDER — LIDOCAINE HCL (PF) 1 % IJ SOLN
INTRAMUSCULAR | Status: DC | PRN
  Administered 2020-05-04: 10 mL via EPIDURAL

## 2020-05-04 MED ORDER — LACTATED RINGERS IV SOLN: 500.0000 mL | Freq: Once | INTRAVENOUS | Status: DC

## 2020-05-04 MED ORDER — IBUPROFEN 600 MG PO TABS
600.0000 mg | ORAL_TABLET | Freq: Four times a day (QID) | ORAL | Status: DC
  Administered 2020-05-04 – 2020-05-06 (×7): 600 mg via ORAL
  Filled 2020-05-04 (×7): qty 1

## 2020-05-04 MED ORDER — OXYTOCIN-SODIUM CHLORIDE 30-0.9 UT/500ML-% IV SOLN
2.5000 [IU]/h | INTRAVENOUS | Status: DC
  Administered 2020-05-04: 2.5 [IU]/h via INTRAVENOUS
  Filled 2020-05-04: qty 500

## 2020-05-04 MED ORDER — DIPHENHYDRAMINE HCL 50 MG/ML IJ SOLN: 12.5000 mg | INTRAMUSCULAR | Status: DC | PRN

## 2020-05-04 MED ORDER — EPHEDRINE 5 MG/ML INJ: 10.0000 mg | INTRAVENOUS | Status: DC | PRN

## 2020-05-04 MED ORDER — ONDANSETRON HCL 4 MG/2ML IJ SOLN: 4.0000 mg | INTRAMUSCULAR | Status: DC | PRN

## 2020-05-04 MED ORDER — WITCH HAZEL-GLYCERIN EX PADS
1.0000 "application " | MEDICATED_PAD | CUTANEOUS | Status: DC | PRN
  Administered 2020-05-04: 1 via TOPICAL

## 2020-05-04 MED ORDER — HYDROCORTISONE ACETATE 25 MG RE SUPP
25.0000 mg | Freq: Two times a day (BID) | RECTAL | Status: DC
  Administered 2020-05-04 – 2020-05-06 (×4): 25 mg via RECTAL
  Filled 2020-05-04 (×6): qty 1

## 2020-05-04 MED ORDER — ACETAMINOPHEN 325 MG PO TABS
650.0000 mg | ORAL_TABLET | ORAL | Status: DC | PRN
  Administered 2020-05-04 – 2020-05-05 (×4): 650 mg via ORAL
  Filled 2020-05-04 (×5): qty 2

## 2020-05-04 MED ORDER — FENTANYL-BUPIVACAINE-NACL 0.5-0.125-0.9 MG/250ML-% EP SOLN
12.0000 mL/h | EPIDURAL | Status: DC | PRN
Start: 2020-05-04 — End: 2020-05-04
  Administered 2020-05-04: 12 mL/h via EPIDURAL
  Filled 2020-05-04: qty 250

## 2020-05-04 MED ORDER — SOD CITRATE-CITRIC ACID 500-334 MG/5ML PO SOLN: 30.0000 mL | ORAL | Status: DC | PRN

## 2020-05-04 MED ORDER — DOCUSATE SODIUM 100 MG PO CAPS
100.0000 mg | ORAL_CAPSULE | Freq: Two times a day (BID) | ORAL | Status: DC
  Administered 2020-05-04 – 2020-05-06 (×4): 100 mg via ORAL
  Filled 2020-05-04 (×4): qty 1

## 2020-05-04 MED ORDER — OXYTOCIN BOLUS FROM INFUSION
333.0000 mL | Freq: Once | INTRAVENOUS | Status: AC
  Administered 2020-05-04: 333 mL via INTRAVENOUS

## 2020-05-04 MED ORDER — FENTANYL CITRATE (PF) 100 MCG/2ML IJ SOLN: 50.0000 ug | INTRAMUSCULAR | Status: DC | PRN

## 2020-05-04 MED ORDER — PHENYLEPHRINE 40 MCG/ML (10ML) SYRINGE FOR IV PUSH (FOR BLOOD PRESSURE SUPPORT): 80.0000 ug | PREFILLED_SYRINGE | INTRAVENOUS | Status: DC | PRN

## 2020-05-04 MED ORDER — PRENATAL MULTIVITAMIN CH
1.0000 | ORAL_TABLET | Freq: Every day | ORAL | Status: DC
  Administered 2020-05-05 – 2020-05-06 (×2): 1 via ORAL
  Filled 2020-05-04 (×2): qty 1

## 2020-05-04 MED ORDER — COCONUT OIL OIL: 1.0000 "application " | TOPICAL_OIL | Status: DC | PRN

## 2020-05-04 MED ORDER — ONDANSETRON HCL 4 MG/2ML IJ SOLN: 4.0000 mg | Freq: Four times a day (QID) | INTRAMUSCULAR | Status: DC | PRN

## 2020-05-04 MED ORDER — DOCUSATE SODIUM 100 MG PO CAPS: 100.0000 mg | ORAL_CAPSULE | Freq: Two times a day (BID) | ORAL | Status: DC | PRN

## 2020-05-04 MED ORDER — OXYTOCIN-SODIUM CHLORIDE 30-0.9 UT/500ML-% IV SOLN
1.0000 m[IU]/min | INTRAVENOUS | Status: DC
  Administered 2020-05-04: 2 m[IU]/min via INTRAVENOUS

## 2020-05-04 MED ORDER — TERBUTALINE SULFATE 1 MG/ML IJ SOLN: 0.2500 mg | Freq: Once | INTRAMUSCULAR | Status: DC | PRN

## 2020-05-04 MED ORDER — LIDOCAINE HCL (PF) 1 % IJ SOLN
30.0000 mL | INTRAMUSCULAR | Status: AC | PRN
  Administered 2020-05-04: 30 mL via SUBCUTANEOUS
  Filled 2020-05-04: qty 30

## 2020-05-04 MED ORDER — LACTATED RINGERS IV SOLN: INTRAVENOUS | Status: DC

## 2020-05-04 MED ORDER — ACETAMINOPHEN 325 MG PO TABS: 650.0000 mg | ORAL_TABLET | ORAL | Status: DC | PRN

## 2020-05-04 MED ORDER — TETANUS-DIPHTH-ACELL PERTUSSIS 5-2.5-18.5 LF-MCG/0.5 IM SUSY: 0.5000 mL | PREFILLED_SYRINGE | Freq: Once | INTRAMUSCULAR | Status: DC

## 2020-05-04 MED ORDER — SIMETHICONE 80 MG PO CHEW: 80.0000 mg | CHEWABLE_TABLET | ORAL | Status: DC | PRN

## 2020-05-04 NOTE — H&P (Signed)
OBSTETRIC ADMISSION HISTORY AND PHYSICAL  Paula Stark is a 30 y.o. female [phone] with IUP at 27w0dby 7wk [place]ore[place] for IOL for A2GDM. She reports +FMs, No LOF, no VB, no blurry vision, headaches or peripheral edema, and RUQ pain.  She plans on breast feeding. She request nexplanon for birth control. [facility]  Dating: By 7wk [place]--->  Estimated Date of Delivery: 05/11/20  Sono:    _0 , CWD, normal anatomy, cephalic presentation, posterior placenta, 2962g, 69% EFW  Pelvis tested to 8lbs 7 oz.   Prenatal History/Complications:  -[id number] on metformin with good control overall  -hemorrhoids  -bmi 34  Past Medical History: Past Medical History:  Diagnosis Date  . Eczema   . Gestational diabetes   . Hemorrhoid   . Postpartum hemorrhage     Past Surgical History: Past Surgical History:  Procedure Laterality Date  . DILATION AND EVACUATION N/A 04/20/2015   Procedure: DILATATION AND EVACUATION;  Surgeon: JWoodroe Mode MD;  [facility];  Service: Gynecology;  Laterality: N/A;    Obstetrical History: OB History    Gravida  5   Para  3   Term  3   Preterm  0   AB  1   Living  3     SAB  1   IAB  0   Ectopic  0   Multiple  0   Live Births  3           Social History Social History   Socioeconomic History  . Marital status: Married    Spouse name: Not on file  . Number of children: Not on file  . Years of education: Not on file  . Highest education level: Not on file  Occupational History  . Not on file  Tobacco Use  . Smoking status: Never Smoker  . Smokeless tobacco: Never Used  Vaping Use  . Vaping Use: Never used  Substance and Sexual Activity  . Alcohol use: No  . Drug use: No  . Sexual activity: Yes    Birth control/protection: None  Other Topics Concern  . Not on file  Social History Narrative  . Not on file   Social Determinants of Health   Financial Resource Strain: Not on file  Food  Insecurity: No Food Insecurity  . Worried About RCharity fundraiserin the Last Year: Never true  . Ran Out of Food in the Last Year: Never true  Transportation Needs: No Transportation Needs  . Lack of Transportation (Medical): No  . Lack of Transportation (Non-Medical): No  Physical Activity: Not on file  Stress: Not on file  Social Connections: Not on file    Family History: Family History  Problem Relation Age of Onset  . Diabetes Neg Hx   . Hypertension Neg Hx   . Obesity Neg Hx     Allergies: No Known Allergies  Medications Prior to Admission  Medication Sig Dispense Refill Last Dose  . Accu-Chek Softclix Lancets lancets Use as instructed 100 each 12   . Blood Pressure Monitoring (BLOOD PRESSURE KIT) DEVI 1 Device by Does not apply route daily. ICD 10: Z34.00 (Patient not taking: No sig reported) 1 each 0   . docusate sodium (COLACE) 100 MG capsule Take 1 capsule (100 mg total) by mouth 2 (two) times daily as needed. 30 capsule 2   . glucose blood (ACCU-CHEK GUIDE) test strip To check blood sugars 4 times a  day. Fasting, and 2 hours after Breakfast, Lunch and Dinner 100 each 12   . hydrocortisone (ANUSOL-HC) 25 MG suppository Place 1 suppository (25 mg total) rectally 2 (two) times daily. (Patient not taking: No sig reported) 24 suppository 1   . hydrocortisone 1 % ointment Apply 1 application topically 2 (two) times daily. (Patient not taking: Reported on 04/21/2020) 30 g 0   . hydrocortisone-pramoxine (PROCTOFOAM HC) rectal foam Place 1 applicator rectally 2 (two) times daily. 10 g 11   . lidocaine (XYLOCAINE) 5 % ointment Apply 1 application topically as needed. 35.44 g 6   . loratadine (CLARITIN) 10 MG tablet Take 1 tablet (10 mg total) by mouth daily. (Patient not taking: Reported on 04/21/2020) 30 tablet 2   . metFORMIN (GLUCOPHAGE) 500 MG tablet Take 1 tablet (500 mg total) by mouth 2 (two) times daily with a meal. 60 tablet 5   . polyethylene glycol powder  (GLYCOLAX/MIRALAX) 17 GM/SCOOP powder Take 17 g by mouth daily as needed. 510 g 1   . pramoxine-hydrocortisone cream Apply topically 3 (three) times daily. (Patient not taking: No sig reported) 57 g 3   . Prenat-Fe Poly-Methfol-FA-DHA (VITAFOL ULTRA) 29-0.6-0.4-200 MG CAPS Take 1 capsule by mouth daily. 30 capsule 11      Review of Systems   All systems reviewed and negative except as stated in HPI  Blood pressure 114/72, pulse 87, temperature 98.6 F (37 C), temperature source Oral, resp. rate 17, last menstrual period 08/12/2019, unknown if currently breastfeeding. General appearance: alert, cooperative and appears stated age Lungs: clear to auscultation bilaterally Heart: regular rate and rhythm Abdomen: soft, non-tender; bowel sounds normal Pelvic: adequate Extremities: Homans sign is negative, no sign of DVT  Presentation: cephalic Fetal monitoring baseline 150, mod variability, pos accels, no decels  Uterine activity irritability  Dilation: 4 Effacement (%): 70 Exam by:: Nikki Dom, RN   Prenatal labs: ABO, Rh: O/Positive/-- (09/21 1142) Antibody: Negative (09/21 1142) Rubella: 6.27 (09/21 1142) RPR: Non Reactive (11/30 0821)  HBsAg: Negative (09/21 1142)  HIV: Non Reactive (11/30 0821)  GBS: Negative/-- (02/02 0946)  2 hr Glucola abnormal Genetic screening  normal Anatomy US low lying placenta, otherwise normal   Prenatal Transfer Tool  Maternal Diabetes: Yes:  Diabetes Type:  Insulin/Medication controlled Genetic Screening: Normal Maternal Ultrasounds/Referrals: Normal Fetal Ultrasounds or other Referrals:  Referred to Materal Fetal Medicine  Maternal Substance Abuse:  No Significant Maternal Medications:  Meds include: Other: metformin  Significant Maternal Lab Results: Group B Strep negative  No results found for this or any previous visit (from the past 24 hour(s)).  Patient Active Problem List   Diagnosis Date Noted  . Encounter for induction of labor  05/04/2020  . COVID-19 affecting pregnancy, antepartum 03/24/2020  . GDM (gestational diabetes mellitus) 02/18/2020  . Hemorrhoids during pregnancy in second trimester 02/03/2020  . Supervision of high-risk pregnancy 10/30/2019  . Obesity affecting pregnancy 10/30/2019    Assessment/Plan:  Paula Stark is a 30 y.o. [id number] at 14w0dhere for IOL for A2GDM.   #Labor: given favorable cervix will proceed with pitocin.   #A2GDM: overall has had good control on metformin. EFW 69%ile_0  weeks. CBG q4hrs   #Pain: Pain meds, epidural prn  #FWB: Cat I. EFW 8lb by Leopold's  #ID:  gbs neg #MOF: both #MOC:nexplanon #Circ:  N/A   JJanet Berlin MD  05/04/2020, 1:10 PM

## 2020-05-04 NOTE — Anesthesia Preprocedure Evaluation (Signed)
Anesthesia Evaluation  Patient identified by MRN, date of birth, ID band Patient awake    Reviewed: Allergy & Precautions, NPO status , Patient's Chart, lab work & pertinent test results  Airway Mallampati: II       Dental no notable dental hx.    Pulmonary neg pulmonary ROS,    Pulmonary exam normal        Cardiovascular negative cardio ROS Normal cardiovascular exam     Neuro/Psych negative neurological ROS  negative psych ROS   GI/Hepatic negative GI ROS, Neg liver ROS,   Endo/Other  diabetes, Gestational  Renal/GU negative Renal ROS  negative genitourinary   Musculoskeletal negative musculoskeletal ROS (+)   Abdominal (+) + obese,   Peds  Hematology negative hematology ROS (+)   Anesthesia Other Findings   Reproductive/Obstetrics                             Anesthesia Physical Anesthesia Plan  ASA: II  Anesthesia Plan: Epidural   Post-op Pain Management:    Induction:   PONV Risk Score and Plan:   Airway Management Planned:   Additional Equipment: None  Intra-op Plan:   Post-operative Plan:   Informed Consent: I have reviewed the patients History and Physical, chart, labs and discussed the procedure including the risks, benefits and alternatives for the proposed anesthesia with the patient or authorized representative who has indicated his/her understanding and acceptance.       Plan Discussed with:   Anesthesia Plan Comments:         Anesthesia Quick Evaluation

## 2020-05-04 NOTE — Progress Notes (Signed)
Labor Progress Note [name] is a 30 y.o. E0P2330 at [redacted]w[redacted]d presented for IOL  S: Patient is resting comfortably.  O:  BP 115/75   Pulse 76   Temp 98.6 F (37 C) (Oral)   Resp 17   Ht 5\' 1"  (1.549 m)   Wt 85.7 kg   LMP 08/12/2019   BMI 35.71 kg/m  EFM: baseline HR 145/mod variability/accels  Toco: contractions 2-4 min   CVE: Dilation: 5.5 Effacement (%): 80 Station: -3 Presentation: Vertex Exam by:: [email or web address], RN   A&P: 30 y.o. [phone] [redacted]w[redacted]d presented for IOL  #Labor: Currently on Pit 10 and progressing well. Will continue to titrate Pit and will AROM once she receives her epidural.  #Pain: analgesics prn #FWB: cat 1 strip  #GBS negative #A2GDM: overall has had good control on metformin. EFW 69%ile@35  weeks. CBG q4hrs   05-29[birth year]1989, DO [name], [facility] 5:54 PM

## 2020-05-04 NOTE — Anesthesia Procedure Notes (Signed)
Epidural Patient location during procedure: OB Start time: 05/04/2020 6:50 PM End time: 05/04/2020 6:53 PM  Staffing Anesthesiologist: Leilani Able, MD Performed: anesthesiologist   Preanesthetic Checklist Completed: patient identified, IV checked, site marked, risks and benefits discussed, surgical consent, monitors and equipment checked, pre-op evaluation and timeout performed  Epidural Patient position: sitting Prep: DuraPrep and site prepped and draped Patient monitoring: continuous pulse ox and blood pressure Approach: midline Location: L3-L4 Injection technique: LOR air  Needle:  Needle type: Tuohy  Needle gauge: 17 G Needle length: 9 cm and 9 Needle insertion depth: 5 cm cm Catheter type: closed end flexible Catheter size: 19 Gauge Catheter at skin depth: 10 cm Test dose: negative and Other  Assessment Events: blood not aspirated, injection not painful, no injection resistance, no paresthesia and negative IV test  Additional Notes Reason for block:procedure for pain

## 2020-05-04 NOTE — Discharge Summary (Incomplete)
Postpartum Discharge Summary  Date of Service updated***     Patient Name: Paula Stark DOB: May 04, 1990 MRN: [id number]  Date of admission: 05/04/2020 Delivery date:05/04/2020  Delivering provider: Janet Berlin  Date of discharge: 05/04/2020  Admitting diagnosis: Encounter for induction of labor [Z34.90] Intrauterine pregnancy: [redacted]w[redacted]d     Secondary diagnosis:  Active Problems:   Hemorrhoids during pregnancy in second trimester   GDM (gestational diabetes mellitus)   Encounter for induction of labor  Additional problems: ***    Discharge diagnosis: {DX.:23714}                                              Post partum procedures:{Postpartum procedures:23558} Augmentation: {SAYTKZSWFUXN:23557} Complications: {OB Labor/Delivery Complications:20784}  Hospital course: {Courses:23701}  Magnesium Sulfate received: {Mag received:30440022} BMZ received: {BMZ received:30440023} Rhophylac:{Rhophylac received:30440032} DUK:{GUR:42706237} T-DaP:{Tdap:23962} Flu: {SEG:31517} Transfusion:{Transfusion received:30440034}  Physical exam  Vitals:   05/04/20 1916 05/04/20 1921 05/04/20 1931 05/04/20 1948  BP: 121/77 125/82 140/78 124/76  Pulse: 72 70 80 72  Resp: 18     Temp: 98.4 F (36.9 C)     TempSrc: Oral     SpO2: 100% 99%    Weight:      Height:       General: {Exam; general:21111117} Lochia: {Desc; appropriate/inappropriate:30686::"appropriate"} Uterine Fundus: {Desc; firm/soft:30687} Incision: {Exam; incision:21111123} DVT Evaluation: {Exam; dvt:2111122} Labs: Lab Results  Component Value Date   WBC 11.3 (H) 05/04/2020   HGB 12.7 05/04/2020   HCT 39.5 05/04/2020   MCV 84.2 05/04/2020   PLT 206 05/04/2020   CMP Latest Ref Rng & Units 03/18/2020  Glucose 65 - 99 mg/dL 127(H)  BUN 6 - 20 mg/dL 9  Creatinine 0.57 - 1.00 mg/dL 0.55(L)  Sodium 134 - 144 mmol/L 140  Potassium 3.5 - 5.2 mmol/L 3.8  Chloride 96 - 106 mmol/L 105  CO2 20 - 29 mmol/L 15(L)  Calcium 8.7 -  10.2 mg/dL 9.7  Total Protein 6.0 - 8.5 g/dL 6.5  Total Bilirubin 0.0 - 1.2 mg/dL <0.2  Alkaline Phos 44 - 121 IU/L 104  AST 0 - 40 IU/L 25  ALT 0 - 32 IU/L 23   Edinburgh Score: No flowsheet data found.   After visit meds:  Allergies as of 05/04/2020   No Known Allergies   Med Rec must be completed prior to using this [facility]***        Discharge home in stable condition Infant Feeding: {Baby feeding:23562} Infant Disposition:{CHL IP OB HOME WITH OHYWVP:71062} Discharge instruction: per After Visit Summary and Postpartum booklet. Activity: Advance as tolerated. Pelvic rest for 6 weeks.  Diet: {OB IRSW:54627035} Future Appointments:No future appointments. Follow up Visit:   Please schedule this patient for a {Visit type:23955} postpartum visit in {Postpartum visit:23953} with the following provider: {Provider type:23954}. Additional Postpartum F/U:{PP Procedure:23957}  {Risk KKXFG:18299} pregnancy complicated by: {BZJIRCVELFYB:01751} Delivery mode:  Vaginal, Spontaneous  Anticipated Birth Control:  {Birth Control:23956}   05/04/2020 Janet Berlin, MD

## 2020-05-04 NOTE — Plan of Care (Signed)

## 2020-05-05 ENCOUNTER — Other Ambulatory Visit: Payer: Medicaid Other

## 2020-05-05 ENCOUNTER — Encounter: Payer: Medicaid Other | Admitting: Family Medicine

## 2020-05-05 LAB — GLUCOSE, CAPILLARY: Glucose-Capillary: 73 mg/dL (ref 70–99)

## 2020-05-05 LAB — RPR: RPR Ser Ql: NONREACTIVE

## 2020-05-05 MED ORDER — LIDOCAINE HCL 1 % IJ SOLN
0.0000 mL | Freq: Once | INTRAMUSCULAR | Status: DC | PRN
  Filled 2020-05-05: qty 20

## 2020-05-05 MED ORDER — OXYCODONE HCL 5 MG PO TABS
10.0000 mg | ORAL_TABLET | ORAL | Status: DC | PRN
  Administered 2020-05-05: 10 mg via ORAL
  Filled 2020-05-05: qty 2

## 2020-05-05 MED ORDER — ETONOGESTREL 68 MG ~~LOC~~ IMPL
68.0000 mg | DRUG_IMPLANT | Freq: Once | SUBCUTANEOUS | Status: DC
  Filled 2020-05-05: qty 1

## 2020-05-05 MED ORDER — OXYCODONE HCL 5 MG PO TABS
5.0000 mg | ORAL_TABLET | ORAL | Status: DC | PRN
  Administered 2020-05-05: 5 mg via ORAL
  Filled 2020-05-05: qty 1

## 2020-05-05 NOTE — Progress Notes (Signed)
Post Partum Day 1 Subjective: no complaints, up ad lib, voiding, tolerating PO and + flatus  Objective: Blood pressure 98/68, pulse 79, temperature 98.1 F (36.7 C), temperature source Oral, resp. rate 17, height 5\' 1"  (1.549 m), weight 85.7 kg, last menstrual period 08/12/2019, SpO2 100 %, unknown if currently breastfeeding.  Physical Exam:  General: alert, cooperative and no distress Lochia: appropriate Uterine Fundus: firm Incision: n/a DVT Evaluation: No evidence of DVT seen on physical exam.  Recent Labs    05/04/20 1258  HGB 12.7  HCT 39.5    Assessment/Plan: Plan for discharge tomorrow   LOS: 1 day   05/06/20 05/05/2020, 4:11 PM

## 2020-05-05 NOTE — Anesthesia Postprocedure Evaluation (Signed)
Anesthesia Post Note  Patient: Paula Stark  Procedure(s) Performed: AN AD HOC LABOR EPIDURAL     Patient location during evaluation: Mother Baby Anesthesia Type: Epidural Level of consciousness: awake, awake and alert and oriented Pain management: pain level not controlled (Patient has back pain. Received ibuprofen at 6am with not much relief.  Asked RN to add tylenol and heat pad which patient would like to get.) Vital Signs Assessment: post-procedure vital signs reviewed and stable Respiratory status: spontaneous breathing, nonlabored ventilation and respiratory function stable Cardiovascular status: stable Postop Assessment: no headache, patient able to bend at knees, no apparent nausea or vomiting, adequate PO intake and able to ambulate Anesthetic complications: no   No complications documented.  Last Vitals:  Vitals:   05/05/20 0230 05/05/20 0618  BP: 118/80 106/80  Pulse: 63 70  Resp: 18 17  Temp: 36.7 C 36.8 C  SpO2: 97% 100%    Last Pain:  Vitals:   05/05/20 0720  TempSrc:   PainSc: 0-No pain   Pain Goal:                   Paula Stark

## 2020-05-06 MED ORDER — ACETAMINOPHEN 500 MG PO TABS: 1000.0000 mg | ORAL_TABLET | Freq: Four times a day (QID) | ORAL | 1 refills | Status: DC | PRN

## 2020-05-06 MED ORDER — IBUPROFEN 600 MG PO TABS: 600.0000 mg | ORAL_TABLET | Freq: Four times a day (QID) | ORAL | 1 refills | Status: DC | PRN

## 2020-05-06 MED ORDER — COCONUT OIL OIL: 1.0000 "application " | TOPICAL_OIL | 0 refills | Status: DC | PRN

## 2020-05-06 NOTE — Discharge Summary (Signed)
Postpartum Discharge Summary  Date of Service updated 05/06/20     Patient Name: Paula Stark DOB: 08-04-1990 MRN: [id number]  Date of admission: 05/04/2020 Delivery date:05/04/2020  Delivering provider: Janet Berlin  Date of discharge: 05/06/2020  Admitting diagnosis: Encounter for induction of labor [Z34.90] Intrauterine pregnancy: [redacted]w[redacted]d    Secondary diagnosis:  Active Problems:   Supervision of high-risk pregnancy   Hemorrhoids during pregnancy in second trimester   GDM (gestational diabetes mellitus)   COVID-19 affecting pregnancy, antepartum   Encounter for induction of labor   Vaginal delivery  Additional problems: none    Discharge diagnosis: Term Pregnancy Delivered and GDM A2                                              Post partum procedures:none Augmentation: Pitocin Complications: Placental Abruption  Hospital course: Induction of Labor With Vaginal Delivery   30y.o. yo [id number] admitted to the hospital 05/04/2020 for induction of labor.  Indication for induction: A2 DM.  Patient had an uncomplicated labor course as follows: Membrane Rupture Time/Date: 7:18 PM ,05/04/2020   Delivery Method:Vaginal, Spontaneous  Episiotomy: None  Lacerations:  1st degree  Details of delivery can be found in separate delivery note.  Patient had a routine postpartum course. Patient is discharged home 05/06/20.  Newborn Data: Birth date:05/04/2020  Birth time:7:43 PM  Gender:Female  Living status:Living  Apgars:9 ,9  Weight:3790 g   Magnesium Sulfate received: No BMZ received: No Rhophylac:N/A MMR:N/A T-DaP:Given prenatally Flu: Yes Transfusion:No  Physical exam  Vitals:   05/05/20 0618 05/05/20 1316 05/05/20 2124 05/06/20 0512  BP: 106/80 98/68 119/82 118/80  Pulse: 70 79 73 73  Resp: '17 17 18 18  ' Temp: 98.2 F (36.8 C) 98.1 F (36.7 C)  97.8 F (36.6 C)  TempSrc: Oral Oral  Oral  SpO2: 100% 100% 100% 100%  Weight:      Height:       General:  alert, cooperative and no distress Lochia: appropriate Uterine Fundus: firm Incision: N/A DVT Evaluation: No evidence of DVT seen on physical exam. Labs: Lab Results  Component Value Date   WBC 11.3 (H) 05/04/2020   HGB 12.7 05/04/2020   HCT 39.5 05/04/2020   MCV 84.2 05/04/2020   PLT 206 05/04/2020   CMP Latest Ref Rng & Units 03/18/2020  Glucose 65 - 99 mg/dL 127(H)  BUN 6 - 20 mg/dL 9  Creatinine 0.57 - 1.00 mg/dL 0.55(L)  Sodium 134 - 144 mmol/L 140  Potassium 3.5 - 5.2 mmol/L 3.8  Chloride 96 - 106 mmol/L 105  CO2 20 - 29 mmol/L 15(L)  Calcium 8.7 - 10.2 mg/dL 9.7  Total Protein 6.0 - 8.5 g/dL 6.5  Total Bilirubin 0.0 - 1.2 mg/dL <0.2  Alkaline Phos 44 - 121 IU/L 104  AST 0 - 40 IU/L 25  ALT 0 - 32 IU/L 23   Edinburgh Score: Edinburgh Postnatal Depression Scale Screening Tool 05/05/2020  I have been able to laugh and see the funny side of things. 0  I have looked forward with enjoyment to things. 0  I have blamed myself unnecessarily when things went wrong. 0  I have been anxious or worried for no good reason. 0  I have felt scared or panicky for no good reason. 0  Things have been getting on top  of me. 0  I have been so unhappy that I have had difficulty sleeping. 0  I have felt sad or miserable. 0  I have been so unhappy that I have been crying. 0  The thought of harming myself has occurred to me. 0  Edinburgh Postnatal Depression Scale Total 0     After visit meds:  Allergies as of 05/06/2020   No Known Allergies     Medication List    STOP taking these medications   Accu-Chek Guide test strip Generic drug: glucose blood   Accu-Chek Softclix Lancets lancets   metFORMIN 500 MG tablet Commonly known as: Glucophage     TAKE these medications   acetaminophen 500 MG tablet Commonly known as: TYLENOL Take 2 tablets (1,000 mg total) by mouth every 6 (six) hours as needed (for pain scale < 4).   Blood Pressure Kit Devi 1 Device by Does not apply route  daily. ICD 10: Z34.00   coconut oil Oil Apply 1 application topically as needed.   docusate sodium 100 MG capsule Commonly known as: COLACE Take 1 capsule (100 mg total) by mouth 2 (two) times daily as needed.   hydrocortisone 1 % ointment Apply 1 application topically 2 (two) times daily.   hydrocortisone 25 MG suppository Commonly known as: ANUSOL-HC Place 1 suppository (25 mg total) rectally 2 (two) times daily.   ibuprofen 600 MG tablet Commonly known as: ADVIL Take 1 tablet (600 mg total) by mouth every 6 (six) hours as needed.   lidocaine 5 % ointment Commonly known as: XYLOCAINE Apply 1 application topically as needed.   polyethylene glycol powder 17 GM/SCOOP powder Commonly known as: GLYCOLAX/MIRALAX Take 17 g by mouth daily as needed.   Vitafol Ultra 29-0.6-0.4-200 MG Caps Take 1 capsule by mouth daily.        Discharge home in stable condition Infant Feeding: Bottle and Breast Infant Disposition:home with mother Discharge instruction: per After Visit Summary and Postpartum booklet. Activity: Advance as tolerated. Pelvic rest for 6 weeks.  Diet: routine diet Future Appointments:No future appointments. Follow up Visit: Message sent to [facility] by Sylvester Harder 05/06/20.   Please schedule this patient for a In person postpartum visit in 4 weeks with the following provider: Any provider. Please also assess hemorrhoids at postpartum visit. Additional Postpartum F/U:2 hour GTT  High risk pregnancy complicated by: GDM Delivery mode:  Vaginal, Spontaneous  Anticipated Birth Control:  Nexplanon, please place at pp visit   08/23/7701 Arrie Senate, MD

## 2020-05-06 NOTE — Discharge Instructions (Signed)
-take tylenol 1000 mg every 6 hours as needed for pain, alternate with ibuprofen 600 mg every 6 hours -drink plenty of water to help with breastfeeding -continue prenatal vitamins while you are breastfeeding -take iron pills every other day with vitamin c, this will help healing as well as breast feeding -think about birth control options-->bedisider.org is a great website! You can get any form of birth control from the health department for free -can use preparation H cream and epsom salt baths for hemorrhoid pain reduction   Postpartum Care After Vaginal Delivery The following information offers guidance about how to care for yourself from the time you deliver your baby to 6-12 weeks after delivery (postpartum period). If you have problems or questions, contact your health care provider for more specific instructions. Follow these instructions at home: Vaginal bleeding  It is normal to have vaginal bleeding (lochia) after delivery. Wear a sanitary pad for bleeding and discharge. ? During the first week after delivery, the amount and appearance of lochia is often similar to a menstrual period. ? Over the next few weeks, it will gradually decrease to a dry, yellow-brown discharge. ? For most women, lochia stops completely by 4-6 weeks after delivery, but can vary.  Change your sanitary pads frequently. Watch for any changes in your flow, such as: ? A sudden increase in volume. ? A change in color. ? Large blood clots.  If you pass a blood clot from your vagina, save it and call your health care provider. Do not flush blood clots down the toilet before talking with your health care provider.  Do not use tampons or douches until your health care provider approves.  If you are not breastfeeding, your period should return 6-8 weeks after delivery. If you are feeding your baby breast milk only, your period may not return until you stop breastfeeding. Perineal care  Keep the area between the  vagina and the anus (perineum) clean and dry. Use medicated pads and pain-relieving sprays and creams as directed.  If you had a surgical cut in the perineum (episiotomy) or a tear, check the area for signs of infection until you are healed. Check for: ? More redness, swelling, or pain. ? Fluid or blood coming from the cut or tear. ? Warmth. ? Pus or a bad smell.  You may be given a squirt bottle to use instead of wiping to clean the perineum area after you use the bathroom. Pat the area gently to dry it.  To relieve pain caused by an episiotomy, a tear, or swollen veins in the anus (hemorrhoids), take a warm sitz bath 2-3 times a day. In a sitz bath, the warm water should only come up to your hips and cover your buttocks.   Breast care  In the first few days after delivery, your breasts may feel heavy, full, and uncomfortable (breast engorgement). Milk may also leak from your breasts. Ask your health care provider about ways to help relieve the discomfort.  If you are breastfeeding: ? Wear a bra that supports your breasts and fits well. Use breast pads to absorb milk that leaks. ? Keep your nipples clean and dry. Apply creams and ointments as told. ? You may have uterine contractions every time you breastfeed for up to several weeks after delivery. This helps your uterus return to its normal size. ? If you have any problems with breastfeeding, notify your health care provider or lactation consultant.  If you are not breastfeeding: ? Avoid touching  your breasts. Do not squeeze out (express) milk. Doing this can make your breasts produce more milk. ? Wear a good-fitting bra and use cold packs to help with swelling. Intimacy and sexuality  Ask your health care provider when you can engage in sexual activity. This may depend upon: ? Your risk of infection. ? How fast you are healing. ? Your comfort and desire to engage in sexual activity.  You are able to get pregnant after delivery, even  if you have not had your period. Talk with your health care provider about methods of birth control (contraception) or family planning if you desire future pregnancies. Medicines  Take over-the-counter and prescription medicines only as told by your health care provider.  Take an over-the-counter stool softener to help ease bowel movements as told by your health care provider.  If you were prescribed an antibiotic medicine, take it as told by your health care provider. Do not stop taking the antibiotic even if you start to feel better.  Review all previous and current prescriptions to check for possible transfer into breast milk. Activity  Gradually return to your normal activities as told by your health care provider.  Rest as much as possible. Nap while your baby is sleeping. Eating and drinking  Drink enough fluid to keep your urine pale yellow.  To help prevent or relieve constipation, eat high-fiber foods every day.  Choose healthy eating to support breastfeeding or weight loss goals.  Take your prenatal vitamins until your health care provider tells you to stop.   General tips/recommendations  Do not use any products that contain nicotine or tobacco. These products include cigarettes, chewing tobacco, and vaping devices, such as e-cigarettes. If you need help quitting, ask your health care provider.  Do not drink alcohol, especially if you are breastfeeding.  Do not take medications or drugs that are not prescribed to you, especially if you are breastfeeding.  Visit your health care provider for a postpartum checkup within the first 3-6 weeks after delivery.  Complete a comprehensive postpartum visit no later than 12 weeks after delivery.  Keep all follow-up visits for you and your baby. Contact a health care provider if:  You feel unusually sad or worried.  Your breasts become red, painful, or hard.  You have a fever or other signs of an infection.  You have  bleeding that is soaking through one pad an hour or you have blood clots.  You have a severe headache that doesn't go away or you have vision changes.  You have nausea and vomiting and are unable to eat or drink anything for 24 hours. Get help right away if:  You have chest pain or difficulty breathing.  You have sudden, severe leg pain.  You faint or have a seizure.  You have thoughts about hurting yourself or your baby. If you ever feel like you may hurt yourself or others, or have thoughts about taking your own life, get help right away. Go to your nearest emergency department or:  Call your local emergency services (911 in the [place]  The National Suicide Prevention Lifeline at 848 716 2162. This suicide crisis helpline is open 24 hours a day.  Text the Crisis Text Line at 623-470-3060 (in the [place] Summary  The period of time after you deliver your newborn up to 6-12 weeks after delivery is called the postpartum period.  Keep all follow-up visits for you and your baby.  Review all previous and current prescriptions to check for possible  transfer into breast milk.  Contact a health care provider if you feel unusually sad or worried during the postpartum period. This information is not intended to replace advice given to you by your health care provider. Make sure you discuss any questions you have with your health care provider. Document Revised: 11/20/2019 Document Reviewed: 11/20/2019 Elsevier Patient Education  2021 Elsevier Inc.  

## 2020-05-12 ENCOUNTER — Other Ambulatory Visit: Payer: Medicaid Other

## 2020-05-12 ENCOUNTER — Encounter: Payer: Medicaid Other | Admitting: Obstetrics and Gynecology

## 2020-05-19 ENCOUNTER — Other Ambulatory Visit: Payer: Medicaid Other

## 2020-05-19 ENCOUNTER — Encounter: Payer: Medicaid Other | Admitting: Obstetrics and Gynecology

## 2020-06-01 ENCOUNTER — Other Ambulatory Visit: Payer: Self-pay | Admitting: Lactation Services

## 2020-06-01 DIAGNOSIS — O24415 Gestational diabetes mellitus in pregnancy, controlled by oral hypoglycemic drugs: Secondary | ICD-10-CM

## 2020-06-04 ENCOUNTER — Other Ambulatory Visit: Payer: Self-pay

## 2020-06-04 ENCOUNTER — Encounter: Payer: Self-pay | Admitting: Family Medicine

## 2020-06-04 ENCOUNTER — Ambulatory Visit (INDEPENDENT_AMBULATORY_CARE_PROVIDER_SITE_OTHER): Payer: Medicaid Other | Admitting: Family Medicine

## 2020-06-04 ENCOUNTER — Other Ambulatory Visit: Payer: Medicaid Other

## 2020-06-04 DIAGNOSIS — K649 Unspecified hemorrhoids: Secondary | ICD-10-CM

## 2020-06-04 DIAGNOSIS — Z3202 Encounter for pregnancy test, result negative: Secondary | ICD-10-CM

## 2020-06-04 DIAGNOSIS — O24419 Gestational diabetes mellitus in pregnancy, unspecified control: Secondary | ICD-10-CM

## 2020-06-04 DIAGNOSIS — O24415 Gestational diabetes mellitus in pregnancy, controlled by oral hypoglycemic drugs: Secondary | ICD-10-CM

## 2020-06-04 DIAGNOSIS — Z30017 Encounter for initial prescription of implantable subdermal contraceptive: Secondary | ICD-10-CM | POA: Diagnosis not present

## 2020-06-04 DIAGNOSIS — M545 Low back pain, unspecified: Secondary | ICD-10-CM

## 2020-06-04 LAB — POCT PREGNANCY, URINE: Preg Test, Ur: NEGATIVE

## 2020-06-04 MED ORDER — ACETAMINOPHEN 500 MG PO TABS: 1000.0000 mg | ORAL_TABLET | Freq: Four times a day (QID) | ORAL | 1 refills | Status: DC | PRN

## 2020-06-04 MED ORDER — POLYETHYLENE GLYCOL 3350 17 GM/SCOOP PO POWD: 17.0000 g | Freq: Every day | ORAL | 1 refills | Status: DC | PRN

## 2020-06-04 MED ORDER — CYCLOBENZAPRINE HCL 5 MG PO TABS: 5.0000 mg | ORAL_TABLET | Freq: Every day | ORAL | 0 refills | Status: DC

## 2020-06-04 MED ORDER — ETONOGESTREL 68 MG ~~LOC~~ IMPL
68.0000 mg | DRUG_IMPLANT | Freq: Once | SUBCUTANEOUS | Status: AC
Start: 2020-06-04 — End: 2020-06-04
  Administered 2020-06-04: 68 mg via SUBCUTANEOUS

## 2020-06-04 MED ORDER — PROCTOFOAM HC 1-1 % EX FOAM
1.0000 | Freq: Two times a day (BID) | CUTANEOUS | 5 refills | Status: DC
Start: 2020-06-04 — End: 2021-01-28

## 2020-06-04 NOTE — Progress Notes (Signed)
     GYNECOLOGY OFFICE PROCEDURE NOTE  Paula Stark is a 30 y.o. [id number] here for post partum Nexplanon insertion.   Nexplanon insertion Procedure Patient identified, informed consent performed, consent signed.   Patient does understand that irregular bleeding is a very common side effect of this medication. She was advised to have backup contraception for one week after placement. Pregnancy test in clinic today was negative.  Appropriate time out taken.  Patient's left arm was prepped and draped in the usual sterile fashion. The ruler used to measure and mark insertion area.  Patient was prepped with alcohol swab and then injected with 3 ml of 1% lidocaine.  She was prepped with betadine, Nexplanon removed from packaging,  Device confirmed in needle, then inserted full length of needle and withdrawn per handbook instructions. Nexplanon was able to palpated in the patient's arm; patient palpated the insert herself. There was minimal blood loss.  Patient insertion site covered with guaze and a pressure bandage to reduce any bruising.  The patient tolerated the procedure well and was given post procedure instructions.  Venora Maples, MD/[facility], [facility] for [employer], [facility]

## 2020-06-04 NOTE — Progress Notes (Signed)
Interpreter [place] [phone] used throughout visit  Post Partum Visit Note  Paula Stark is a 30 y.o. [phone] female who presents for a postpartum visit. She is 4 weeks postpartum following a normal spontaneous vaginal delivery.  I have fully reviewed the prenatal and intrapartum course. The delivery was at 39 gestational weeks.  Anesthesia: epidural. Postpartum course has been uneventful. Baby is doing well. Baby is feeding by both breast and bottle - [place]. Bleeding brown. Bowel function is normal. Bladder function is normal. Patient is not sexually active. Contraception method is none. Postpartum depression screening: {negative   The pregnancy intention screening data noted above was reviewed. Potential methods of contraception were discussed. The patient elected to proceed with Hormonal Implant.    Edinburgh Postnatal Depression Scale - 06/04/20 0954      Edinburgh Postnatal Depression Scale:  In the Past 7 Days   I have been able to laugh and see the funny side of things. 0    I have looked forward with enjoyment to things. 0    I have blamed myself unnecessarily when things went wrong. 0    I have been anxious or worried for no good reason. 0    I have felt scared or panicky for no good reason. 0    Things have been getting on top of me. 0    I have been so unhappy that I have had difficulty sleeping. 0    I have felt sad or miserable. 0    I have been so unhappy that I have been crying. 0    The thought of harming myself has occurred to me. 0    Edinburgh Postnatal Depression Scale Total 0            The following portions of the patient's history were reviewed and updated as appropriate: allergies, current medications, past family history, past medical history, past social history, past surgical history and problem list.  Review of Systems Pertinent items noted in HPI and remainder of comprehensive ROS otherwise negative.    Objective:  BP 127/80   Pulse (!) 51    Wt 174 lb 1.6 oz (79 kg)   BMI 32.90 kg/m    General:  alert, cooperative and appears stated age  Lungs: comfortable on room air   Vulva:  not evaluated        Assessment:    Normal postpartum exam. Pap smear not done at today's visit.   Plan:   Essential components of care per ACOG recommendations:  1.  Mood and well being: Patient with negative depression screening today. Reviewed local resources for support.  - Patient does not use tobacco.  - hx of drug use? No    2. Infant care and feeding:  -Patient currently breastmilk feeding? Yes  -Social determinants of health (SDOH) reviewed in EPIC. No concerns  3. Sexuality, contraception and birth spacing - Patient does not want a pregnancy in the next year.  Desired family size is 4 children.  - Reviewed forms of contraception in tiered fashion. Patient desired Nexplanon today.   - Discussed birth spacing of 18 months  4. Sleep and fatigue -Encouraged family/partner/community support of 4 hrs of uninterrupted sleep to help with mood and fatigue  5. Physical Recovery  - Discussed patients delivery and complications - Patient had no lacerations - Patient has urinary incontinence? No - Patient is safe to resume physical and sexual activity  6.  Health Maintenance - Last pap smear done  07/14/2019 and was normal with negative HPV. - Mammogram: n/a  7. Chronic Disease - GDM: 2hr GTT today - reports mild low back pain, muscle spasm on exam, flexeril sent - ongoing problems with hemorrhoids, has been taking ibuprofen consistently since delivery. Discussed GI and renal side effects of prolonged dosing, recommended switch to tylenol, take miralax regularly, and will refer to Gen Surg for evaluation  Venora Maples, MD Center for [facility], [facility]

## 2020-06-04 NOTE — Patient Instructions (Signed)

## 2020-06-05 ENCOUNTER — Telehealth: Payer: Self-pay | Admitting: Family Medicine

## 2020-06-05 LAB — GLUCOSE TOLERANCE, 2 HOURS
Glucose, 2 hour: 151 mg/dL — ABNORMAL HIGH (ref 65–139)
Glucose, GTT - Fasting: 103 mg/dL — ABNORMAL HIGH (ref 65–99)

## 2020-06-05 MED ORDER — METFORMIN HCL 500 MG PO TABS: 500.0000 mg | ORAL_TABLET | Freq: Two times a day (BID) | ORAL | 11 refills | Status: DC

## 2020-06-05 NOTE — Telephone Encounter (Signed)
Please call patient and let her know that she failed her post partum 2hr GTT, indicating she has either pre-diabetes or diabetes. She should see a PCP as soon as possible to get further testing and management for this condition. In the meantime she should restart taking Metformin 500mg  BID which I have sent to her pharmacy.

## 2020-06-07 NOTE — Telephone Encounter (Signed)
Called patient and informed her of her 2 hour GTT results and need to restart her Metformin that was sent in to her Pharmacy.   Reviewed patient needs to find a PCP for follow up at earliest available appointment Will Send list of PCP's via My Chart as Patient does not currently have one.   Patient with no questions or concerns at this time.

## 2020-06-16 ENCOUNTER — Encounter: Payer: Self-pay | Admitting: General Practice

## 2020-07-01 ENCOUNTER — Encounter: Payer: Self-pay | Admitting: Family Medicine

## 2020-07-01 ENCOUNTER — Ambulatory Visit (INDEPENDENT_AMBULATORY_CARE_PROVIDER_SITE_OTHER): Payer: Medicaid Other | Admitting: Family Medicine

## 2020-07-01 ENCOUNTER — Other Ambulatory Visit: Payer: Self-pay

## 2020-07-01 VITALS — BP 104/64 | HR 73 | Wt 174.4 lb

## 2020-07-01 DIAGNOSIS — O24419 Gestational diabetes mellitus in pregnancy, unspecified control: Secondary | ICD-10-CM

## 2020-07-01 DIAGNOSIS — R7303 Prediabetes: Secondary | ICD-10-CM | POA: Diagnosis present

## 2020-07-01 DIAGNOSIS — R7309 Other abnormal glucose: Secondary | ICD-10-CM | POA: Insufficient documentation

## 2020-07-01 LAB — POCT GLYCOSYLATED HEMOGLOBIN (HGB A1C): Hemoglobin A1C: 5.9 % — AB (ref 4.0–5.6)

## 2020-07-01 NOTE — Patient Instructions (Signed)
It was wonderful to see you today.  Please bring ALL of your medications with you to every visit.   Today we talked about:  -We did a test to measure if you have diabetes or pre-diabetes. If you have diabetes we may need further testing to determine if you have Type 1 or Type 2 diabetes. I will let you know the results. Continue to take your Metformin twice a day.    Thank you for choosing [facility].   Please call [phone] with any questions about today's appointment.  Please be sure to schedule follow up at the front  desk before you leave today.   Paula Dick, DO PGY-1 Family Medicine    Diabetes Mellitus and Nutrition, Adult When you have diabetes, or diabetes mellitus, it is very important to have healthy eating habits because your blood sugar (glucose) levels are greatly affected by what you eat and drink. Eating healthy foods in the right amounts, at about the same times every day, can help you:  Control your blood glucose.  Lower your risk of heart disease.  Improve your blood pressure.  Reach or maintain a healthy weight. What can affect my meal plan? Every person with diabetes is different, and each person has different needs for a meal plan. Your health care provider may recommend that you work with a dietitian to make a meal plan that is best for you. Your meal plan may vary depending on factors such as:  The calories you need.  The medicines you take.  Your weight.  Your blood glucose, blood pressure, and cholesterol levels.  Your activity level.  Other health conditions you have, such as heart or kidney disease. How do carbohydrates affect me? Carbohydrates, also called carbs, affect your blood glucose level more than any other type of food. Eating carbs naturally raises the amount of glucose in your blood. Carb counting is a method for keeping track of how many carbs you eat. Counting carbs is important to keep your blood glucose  at a healthy level, especially if you use insulin or take certain oral diabetes medicines. It is important to know how many carbs you can safely have in each meal. This is different for every person. Your dietitian can help you calculate how many carbs you should have at each meal and for each snack. How does alcohol affect me? Alcohol can cause a sudden decrease in blood glucose (hypoglycemia), especially if you use insulin or take certain oral diabetes medicines. Hypoglycemia can be a life-threatening condition. Symptoms of hypoglycemia, such as sleepiness, dizziness, and confusion, are similar to symptoms of having too much alcohol.  Do not drink alcohol if: ? Your health care provider tells you not to drink. ? You are pregnant, may be pregnant, or are planning to become pregnant.  If you drink alcohol: ? Do not drink on an empty stomach. ? Limit how much you use to:  0-1 drink a day for women.  0-2 drinks a day for men. ? Be aware of how much alcohol is in your drink. In the [place] one drink equals one 12 oz bottle of beer (355 mL), one 5 oz glass of wine (148 mL), or one 1 oz glass of hard liquor (44 mL). ? Keep yourself hydrated with water, diet soda, or unsweetened iced tea.  Keep in mind that regular soda, juice, and other mixers may contain a lot of sugar and must be counted as carbs. What are tips for following this  plan? Reading food labels  Start by checking the serving size on the "Nutrition Facts" label of packaged foods and drinks. The amount of calories, carbs, fats, and other nutrients listed on the label is based on one serving of the item. Many items contain more than one serving per package.  Check the total grams (g) of carbs in one serving. You can calculate the number of servings of carbs in one serving by dividing the total carbs by 15. For example, if a food has 30 g of total carbs per serving, it would be equal to 2 servings of carbs.  Check the number of grams  (g) of saturated fats and trans fats in one serving. Choose foods that have a low amount or none of these fats.  Check the number of milligrams (mg) of salt (sodium) in one serving. Most people should limit total sodium intake to less than 2,300 mg per day.  Always check the nutrition information of foods labeled as "low-fat" or "nonfat." These foods may be higher in added sugar or refined carbs and should be avoided.  Talk to your dietitian to identify your daily goals for nutrients listed on the label. Shopping  Avoid buying canned, pre-made, or processed foods. These foods tend to be high in fat, sodium, and added sugar.  Shop around the outside edge of the grocery store. This is where you will most often find fresh fruits and vegetables, bulk grains, fresh meats, and fresh dairy. Cooking  Use low-heat cooking methods, such as baking, instead of high-heat cooking methods like deep frying.  Cook using healthy oils, such as olive, canola, or sunflower oil.  Avoid cooking with butter, cream, or high-fat meats. Meal planning  Eat meals and snacks regularly, preferably at the same times every day. Avoid going long periods of time without eating.  Eat foods that are high in fiber, such as fresh fruits, vegetables, beans, and whole grains. Talk with your dietitian about how many servings of carbs you can eat at each meal.  Eat 4-6 oz (112-168 g) of lean protein each day, such as lean meat, chicken, fish, eggs, or tofu. One ounce (oz) of lean protein is equal to: ? 1 oz (28 g) of meat, chicken, or fish. ? 1 egg. ?  cup (62 g) of tofu.  Eat some foods each day that contain healthy fats, such as avocado, nuts, seeds, and fish.   What foods should I eat? Fruits Berries. Apples. Oranges. Peaches. Apricots. Plums. Grapes. Mango. Papaya. Pomegranate. Kiwi. Cherries. Vegetables Lettuce. Spinach. Leafy greens, including kale, chard, collard greens, and mustard greens. Beets. Cauliflower.  Cabbage. Broccoli. Carrots. Green beans. Tomatoes. Peppers. Onions. Cucumbers. Brussels sprouts. Grains Whole grains, such as whole-wheat or whole-grain bread, crackers, tortillas, cereal, and pasta. Unsweetened oatmeal. Quinoa. Brown or wild rice. Meats and other proteins Seafood. Poultry without skin. Lean cuts of poultry and beef. Tofu. Nuts. Seeds. Dairy Low-fat or fat-free dairy products such as milk, yogurt, and cheese. The items listed above may not be a complete list of foods and beverages you can eat. Contact a dietitian for more information. What foods should I avoid? Fruits Fruits canned with syrup. Vegetables Canned vegetables. Frozen vegetables with butter or cream sauce. Grains Refined white flour and flour products such as bread, pasta, snack foods, and cereals. Avoid all processed foods. Meats and other proteins Fatty cuts of meat. Poultry with skin. Breaded or fried meats. Processed meat. Avoid saturated fats. Dairy Full-fat yogurt, cheese, or milk. Beverages Sweetened  drinks, such as soda or iced tea. The items listed above may not be a complete list of foods and beverages you should avoid. Contact a dietitian for more information. Questions to ask a health care provider  Do I need to meet with a diabetes educator?  Do I need to meet with a dietitian?  What number can I call if I have questions?  When are the best times to check my blood glucose? Where to find more information:  American Diabetes Association: diabetes.org  Academy of Nutrition and Dietetics: www.eatright.[employer] of Diabetes and Digestive and Kidney Diseases: CarFlippers.tn  Association of Diabetes Care and Education Specialists: www.diabeteseducator.org Summary  It is important to have healthy eating habits because your blood sugar (glucose) levels are greatly affected by what you eat and drink.  A healthy meal plan will help you control your blood glucose and maintain  a healthy lifestyle.  Your health care provider may recommend that you work with a dietitian to make a meal plan that is best for you.  Keep in mind that carbohydrates (carbs) and alcohol have immediate effects on your blood glucose levels. It is important to count carbs and to use alcohol carefully. This information is not intended to replace advice given to you by your health care provider. Make sure you discuss any questions you have with your health care provider. Document Revised: 02/11/2019 Document Reviewed: 02/11/2019 Elsevier Patient Education  2021 [employer].

## 2020-07-01 NOTE — Assessment & Plan Note (Addendum)
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  She had a postpartum 2-hour GTT on 3/18 that was elevated at 2 hours with a glucose of 151.  Hemoglobin A1c today shows prediabetic range at 5.9.  She is currently on Metformin 500 mg twice daily.  Given her history of gestational diabetes and obesity, will continue her Metformin for now (she is tolerating it well).  We discussed lifestyle modifications including exercise and diet.  In the future, pending weight loss/A1c improvement we can discuss stopping medication. -Continue Metformin 500mg  BID -Repeat A1c in 3 months; can re-assess need for Metformin at that time -Encourage exercise and diet modifications: handout given today on diabetes diet

## 2020-07-01 NOTE — Progress Notes (Addendum)
    SUBJECTIVE:   Paula Stark video interpreter used for entirety of visit: Paula Stark  [phone]  CHIEF COMPLAINT / HPI:   Concern for Diabetes Patient presents today after recommendation from her OB.  States that she needs screening for diabetes as she failed her diabetes test.  She had gestational diabetes and was on Metformin 500 mg twice daily during the third trimester of her most recent pregnancy.  She delivered on February 15th to a healthy baby girl.  This was her fourth baby and the first time that she was diagnosed with gestational diabetes.  She reports having no other medical problems.  She is not on any other daily medication.  She denies symptoms of polyuria, polydipsia, weight loss, dehydration.  Denies family history of diabetes.  Her diet consists of traditional care and food including white rice.  She does endorse having sweet cravings during her pregnancy and eating lots of sugar.  She reports checking her blood sugars during pregnancy 4 times a day with the highest readings being about 185.   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  She had a postpartum 2-hour GTT on 3/18 that was elevated at 2 hours with a glucose of 151.  Hemoglobin A1c today shows prediabetic range at 5.9.  She is currently on Metformin 500 mg twice daily.  Given her history of gestational diabetes and obesity, will continue her Metformin for now (she is tolerating it well).  We discussed lifestyle  modifications including exercise and diet.  In the future, pending weight loss/A1c improvement we can discuss stopping medication. -Continue Metformin 500mg  BID -Repeat A1c in 3 months; can re-assess need for Metformin at that time -Encourage exercise and diet modifications: handout given today on diabetes diet     4/18, DO [facility]

## 2020-07-08 ENCOUNTER — Other Ambulatory Visit: Payer: Self-pay | Admitting: Family Medicine

## 2020-07-08 DIAGNOSIS — K649 Unspecified hemorrhoids: Secondary | ICD-10-CM

## 2020-09-21 NOTE — Progress Notes (Deleted)
    SUBJECTIVE:   CHIEF COMPLAINT / HPI:   Follow up Prediabetes Patient presents to clinic today to follow up on her prediabetes. She was initially diagnosed with gestational diabetes during her most recent pregnancy and started on Metformin. Her glucose remained elevated at her post-partum OB visit and she was referred to the family medicine clinic for further monitoring. Hgb A1c at that visit in April was elevated at 5.9. She elected to continue on Metformin 500 mg BID. Since that vist, she has lost/gained*** lbs. Her diet ***. For exercise she is ***. Denies polydipsia, polyuria, dehydration, unintentional weight loss***.   PERTINENT  PMH / PSH:  Past Medical History:  Diagnosis Date   Eczema    Gestational diabetes    Hemorrhoid    Postpartum hemorrhage      OBJECTIVE:   There were no vitals taken for this visit.   General: NAD, pleasant, able to participate in exam Cardiac: RRR, no murmurs. Respiratory: CTAB, normal effort, No wheezes, rales or rhonchi Abdomen: Bowel sounds present, nontender, nondistended, no hepatosplenomegaly. Extremities: no edema or cyanosis. Skin: warm and dry, no rashes noted Neuro: alert, no obvious focal deficits Psych: Normal affect and mood  ASSESSMENT/PLAN:   No problem-specific Assessment & Plan notes found for this encounter.     Sabino Dick, DO [facility]

## 2020-09-22 ENCOUNTER — Ambulatory Visit: Payer: Medicaid Other | Admitting: Family Medicine

## 2020-10-05 NOTE — Progress Notes (Signed)
    SUBJECTIVE:   CHIEF COMPLAINT / HPI:   Follow up Prediabetes Patient presents to clinic today to follow up on her prediabetes. She was initially diagnosed with gestational diabetes during her most recent pregnancy (delivered February 15th) and started on Metformin. Her glucose remained elevated at her post-partum OB visit and she was referred to the family medicine clinic for further monitoring. Hgb A1c at that visit in April was elevated at 5.9. She elected to continue on Metformin 500 mg BID. Since that vist, she has maintained the same weight. Her diet consists of white rice and other traditional [employer] foods. She does not exercise much as she has a young infant but will try to find some time. Denies polydipsia, polyuria, dehydration, unintentional weight loss.   Hemorrhoids States they have been present for the last 9 years.  She is using hemorrhoid cream and lidocaine as needed, these helpful with the discomfort.  She is wondering if these could be removed however states they are relatively small. She does not have any bloody stools. Appear to be external as she states that she pushes them back in. Declines examination today.   PERTINENT  PMH / PSH:  Past Medical History:  Diagnosis Date   Eczema    Gestational diabetes    Hemorrhoid    Postpartum hemorrhage      OBJECTIVE:   BP 105/75   Pulse 87   Ht 5\' 2"  (1.575 m)   Wt 174 lb (78.9 kg)   LMP  (LMP Unknown)   SpO2 100%   BMI 31.83 kg/m    General: NAD, pleasant, able to participate in exam Cardiac: RRR, no murmurs. Respiratory: CTAB, normal effort, No wheezes, rales or rhonchi Abdomen: Bowel sounds present, nontender, nondistended, no hepatosplenomegaly. Skin: warm and dry, no rashes noted Neuro: alert, no obvious focal deficits Psych: Normal affect and mood  ASSESSMENT/PLAN:   Prediabetes Extensive nutritional and dietary education provided today. Elected to discontinue Metformin and recheck Hgb A1c in April.  Will focus on dietary and exercise recommendations until then.   Hemorrhoids Declined examination today. She is using cream for this. They are bothersome to her but she reports they are small the cream helps. We discussed fiber supplementation to help. Discussed possible referral to GI/Gen surg if conservatives measures fail. No red flag symptoms.     May, DO [facility]

## 2020-10-08 ENCOUNTER — Other Ambulatory Visit: Payer: Self-pay

## 2020-10-08 ENCOUNTER — Ambulatory Visit (INDEPENDENT_AMBULATORY_CARE_PROVIDER_SITE_OTHER): Payer: Medicaid Other | Admitting: Family Medicine

## 2020-10-08 ENCOUNTER — Encounter: Payer: Self-pay | Admitting: Family Medicine

## 2020-10-08 DIAGNOSIS — K649 Unspecified hemorrhoids: Secondary | ICD-10-CM | POA: Diagnosis not present

## 2020-10-08 DIAGNOSIS — R7303 Prediabetes: Secondary | ICD-10-CM | POA: Insufficient documentation

## 2020-10-08 DIAGNOSIS — E119 Type 2 diabetes mellitus without complications: Secondary | ICD-10-CM | POA: Insufficient documentation

## 2020-10-08 MED ORDER — POLYETHYLENE GLYCOL 3350 17 GM/SCOOP PO POWD: 17.0000 g | Freq: Every day | ORAL | 1 refills | Status: DC | PRN

## 2020-10-08 NOTE — Assessment & Plan Note (Signed)
Declined examination today. She is using cream for this. They are bothersome to her but she reports they are small the cream helps. We discussed fiber supplementation to help. Discussed possible referral to GI/Gen surg if conservatives measures fail. No red flag symptoms.

## 2020-10-08 NOTE — Patient Instructions (Addendum)
It was wonderful to see you today.  Please bring ALL of your medications with you to every visit.   Today we talked about:  -Please get your vision test.  -You can take Metamucil. Try to increase your fiber. Continue to use the hemorrhoid cream.  -You can take MiraLAX to help regulate your bowel movements.  -You can stop the Metformin. We will recheck your Hemoglobin A1c in April.  -Continue to work on diet and exercise.   Thank you for choosing [facility].   Please call [phone] with any questions about today's appointment.  Please be sure to schedule follow up at the front  desk before you leave today.   Sabino Dick, DO PGY-2 Family Medicine    Fiber Content in Foods Fiber is a substance that is found in plant foods, such as fruits, vegetables, whole grains, nuts, seeds, and beans. As part of your treatment and recovery plan, your health care provider may recommend that you eat foods that have specific amounts of dietary fiber. Some conditions may require a high-fiberdiet while others may require a low-fiber diet. This sheet gives you information about the dietary fiber content of some common foods. Your health care provider will tell you how much fiber you need in your diet. If you have problems or questions, contact your health care provider ordietitian. What foods are high in fiber?  Fruits Blackberries or raspberries (fresh) --  cup (75 g) has 4 g of fiber. Pear (fresh) -- 1 medium (180 g) has 5.5 g of fiber. Prunes (dried) -- 6 to 8 pieces (57-76 g) has 5 g of fiber. Apple with skin -- 1 medium (182 g) has 4.8 g of fiber. Guava -- 1 cup (128 g) has 8.9 g of fiber. Vegetables Peas (frozen) --  cup (80 g) has 4.4 g of fiber. Potato with skin (baked) -- 1 medium (173 g) has 4.4 g of fiber. Pumpkin (canned) --  cup (122 g) has 5 g of fiber. Brussels sprouts (cooked) --  cup (78 g) has 4 g of fiber. Sweet potato --  cup mashed (124 g) has 4 g  of fiber. Winter squash -- 1 cup cooked (205 g) has 5.7 g of fiber. Grains Bran cereal --  cup (31 g) has 8.6 g of fiber. Bulgur (cooked) --  cup (70 g) has 4 g of fiber. Quinoa (cooked) -- 1 cup (185 g) has 5.2 g of fiber. Popcorn -- 3 cups (375 g) popped has 5.8 g of fiber. Spaghetti, whole wheat -- 1 cup (140 g) has 6 g of fiber. Meats and other proteins Pinto beans (cooked) --  cup (90 g) has 7.7 g of fiber. Lentils (cooked) --  cup (90 g) has 7.8 g of fiber. Kidney beans (canned) --  cup (92.5 g) has 5.7 g of fiber. Soybeans (canned, frozen, or fresh) --  cup (92.5 g) has 5.2 g of fiber. Baked beans, plain or vegetarian (canned) --  cup (130 g) has 5.2 g of fiber. Garbanzo beans or chickpeas (canned) --  cup (90 g) has 6.6 g of fiber. Black beans (cooked) --  cup (86 g) has 7.5 g of fiber. White beans or navy beans (cooked) --  cup (91 g) has 9.3 g of fiber. The items listed above may not be a complete list of foods with high fiber. Actual amounts of fiber may be different depending on processing. Contact a dietitian for more information. What foods are moderate in fiber?  Fruits Banana -- 1 medium (126 g) has 3.2 g of fiber. Melon -- 1 cup (155 g) has 1.4 g of fiber. Orange -- 1 small (154 g) has 3.7 g of fiber. Raisins --  cup (40 g) has 1.8 g of fiber. Applesauce, sweetened --  cup (125 g) has 1.5 g of fiber. Blueberries (fresh) --  cup (75 g) has 1.8 g of fiber. Strawberries (fresh, sliced) -- 1 cup (150 g) has 3 g of fiber. Cherries -- 1 cup (140 g) has 2.9 g of fiber. Vegetables Broccoli (cooked) --  cup (77.5 g) has 2.1 g of fiber. Carrots (cooked) --  cup (77.5 g) has 2.2 g of fiber. Corn (canned or frozen) --  cup (82.5 g) has 2.1 g of fiber. Potatoes, mashed --  cup (105 g) has 1.6 g of fiber. Tomato -- 1 medium (62 g) has 1.5 g of fiber. Green beans (canned) --  cup (83 g) has 2 g of fiber. Squash, winter --  cup (58 g) has 1 g of fiber. Sweet  potato, baked -- 1 medium (150 g) has 3 g of fiber. Cauliflower (cooked) -- 1/2 cup (90 g) has 2.3 g of fiber. Grains Long-grain brown rice (cooked) -- 1 cup (196 g) has 3.5 g of fiber. Bagel, plain -- one 4-inch (10 cm) bagel has 2 g of fiber. Instant oatmeal --  cup (120 g) has about 2 g of fiber. Macaroni noodles, enriched (cooked) -- 1 cup (140 g) has 2.5 g of fiber. Multigrain cereal --  cup (15 g) has about 2-4 g of fiber. Whole-wheat bread -- 1 slice (26 g) has 2 g of fiber. Whole-wheat spaghetti noodles --  cup (70 g) has 3.2 g of fiber. Corn tortilla -- one 6-inch (15 cm) tortilla has 1.5 g of fiber. Meats and other proteins Almonds --  cup or 1 oz (28 g) has 3.5 g of fiber. Sunflower seeds in shell --  cup or  oz (11.5 g) has 1.1 g of fiber. Vegetable or soy patty -- 1 patty (70 g) has 3.4 g of fiber. Walnuts --  cup or 1 oz (30 g) has 2 g of fiber. Flax seed -- 1 Tbsp (7 g) has 2.8 g of fiber. The items listed above may not be a complete list of foods that have moderate amounts of fiber. Actual amounts of fiber may be different depending on processing. Contact a dietitian for more information. What foods are low in fiber?  Low-fiber foods contain less than 1 g of fiber per serving. They include: Fruits Fruit juice --  cup or 4 fl oz (118 mL) has 0.5 g of fiber. Vegetables Lettuce -- 1 cup (35 g) has 0.5 g of fiber. Cucumber (slices) --  cup (60 g) has 0.3 g of fiber. Celery -- 1 stalk (40 g) has 0.1 g of fiber. Grains Flour tortilla -- one 6-inch (15 cm) tortilla has 0.5 g of fiber. White rice (cooked) --  cup (81.5 g) has 0.3 g of fiber. Meats and other proteins Egg -- 1 large (50 g) has 0 g of fiber. Meat, poultry, or fish -- 3 oz (85 g) has 0 g of fiber. Dairy Milk -- 1 cup or 8 fl oz (237 mL) has 0 g of fiber. Yogurt -- 1 cup (245 g) has 0 g of fiber. The items listed above may not be a complete list of foods that are low in fiber. Actual amounts of fiber  may be  different depending on processing. Contact a dietitian for more information. Summary Fiber is a substance that is found in plant foods, such as fruits, vegetables, whole grains, nuts, seeds, and beans. As part of your treatment and recovery plan, your health care provider may recommend that you eat foods that have specific amounts of dietary fiber. This information is not intended to replace advice given to you by your health care provider. Make sure you discuss any questions you have with your healthcare provider. Document Revised: 07/10/2019 Document Reviewed: 07/10/2019 Elsevier Patient Education  2022 [employer].

## 2020-10-08 NOTE — Assessment & Plan Note (Signed)
Extensive nutritional and dietary education provided today. Elected to discontinue Metformin and recheck Hgb A1c in April. Will focus on dietary and exercise recommendations until then.

## 2020-10-13 ENCOUNTER — Encounter: Payer: Self-pay | Admitting: Family Medicine

## 2020-10-13 ENCOUNTER — Other Ambulatory Visit: Payer: Self-pay

## 2020-10-13 ENCOUNTER — Ambulatory Visit (INDEPENDENT_AMBULATORY_CARE_PROVIDER_SITE_OTHER): Payer: Medicaid Other | Admitting: Family Medicine

## 2020-10-13 VITALS — HR 75 | Ht 62.0 in | Wt 172.8 lb

## 2020-10-13 DIAGNOSIS — K649 Unspecified hemorrhoids: Secondary | ICD-10-CM

## 2020-10-13 LAB — HEMOCCULT GUIAC POC 1CARD (OFFICE): Fecal Occult Blood, POC: NEGATIVE

## 2020-10-13 MED ORDER — "NITROGLYCERIN NICU 2% OINTMENT ": 1.0000 "application " | TOPICAL_OINTMENT | Freq: Two times a day (BID) | TRANSDERMAL | 1 refills | Status: DC

## 2020-10-13 NOTE — Progress Notes (Signed)
   SUBJECTIVE:   CHIEF COMPLAINT / HPI:   Paula Stark is a 30 y.o. female here for hemorrhoid follow up. Pt seen in [facility] clinic last week for same sx. Reports no improvement with stool softeners and OTC creams. Reprots worsening hemorrhoids since having her last child early this year. She had 4 vaginal births between 2013 and 2022.  Sitting makes sx worse. Feels like she can't get comfortable. Denies rectal bleeding. Has increased pain. Wiping with toilet paper hurts. Pt would like referral for removal.    PERTINENT  PMH / PSH: reviewed and updated as appropriate   OBJECTIVE:   Pulse 75   Ht 5\' 2"  (1.575 m)   Wt 172 lb 12.8 oz (78.4 kg)   LMP  (LMP Unknown)   SpO2 97%   BMI 31.61 kg/m    GEN: well appearing female, sitting but uncomfortable in chair CVS: well perfused  RESP: speaking in full sentences without pause, no respiratory distress Abdomen: soft, non-tender, Rectal exam: tenderness noted Anoscopy performed, hemorrhoids notable, sphincter tone normal, stool guaiac negative.   PROCEDURE: anoscopy   PERFORMED BY: , DO  Verbal consent obtained. Anoscope lubricated  and inserted it into the anus as far as the patient can tolerate. Pt asked to breathe deeply and bear down slightly. Removed the obturator to examined the anal mucosa. Non-bleeding hemorrhoids notable.   ASSESSMENT/PLAN:   Hemorrhoids Hemorrhoids on exam. Hemoccult negative. Continue Miralax. Start NG ointment 2% BID. Advised pt of headache adverse effect. Referral to GI placed. Recommended to increase fiber intake ~35gm daily.      Katha Cabal, DO PGY-3,  Family Medicine 10/15/2020

## 2020-10-13 NOTE — Patient Instructions (Signed)
It was great seeing you today!   - Keep taking the stool softner daily - Increase you fiber intake  - Stop by the pharmacy to pick up you medication. Reminder that this medication can cause a headache.  - A referral was placed to GI for possible banding of your hemorrhoids   If you have questions or concerns please do not hesitate to call at [phone].  Dr. Rachael Darby  [facility]

## 2020-10-15 ENCOUNTER — Telehealth: Payer: Self-pay

## 2020-10-15 NOTE — Assessment & Plan Note (Signed)
Hemorrhoids on exam. Hemoccult negative. Continue Miralax. Start NG ointment 2% BID. Advised pt of headache adverse effect. Referral to GI placed. Recommended to increase fiber intake ~35gm daily.

## 2020-10-15 NOTE — Telephone Encounter (Signed)
Received fax from pharmacy, PA needed on Nitroglycerin 2% ointment.  Clinical questions submitted via Cover My Meds.  Waiting on response, could take up to 72 hours.  Cover My Meds info: Key:  [id number]  Veronda Prude, RN

## 2020-10-19 DIAGNOSIS — K648 Other hemorrhoids: Secondary | ICD-10-CM | POA: Diagnosis not present

## 2020-10-29 DIAGNOSIS — K625 Hemorrhage of anus and rectum: Secondary | ICD-10-CM | POA: Diagnosis not present

## 2020-10-29 DIAGNOSIS — K648 Other hemorrhoids: Secondary | ICD-10-CM | POA: Diagnosis not present

## 2020-10-30 ENCOUNTER — Other Ambulatory Visit: Payer: Self-pay | Admitting: Obstetrics & Gynecology

## 2020-10-30 DIAGNOSIS — K649 Unspecified hemorrhoids: Secondary | ICD-10-CM

## 2020-12-04 DIAGNOSIS — K648 Other hemorrhoids: Secondary | ICD-10-CM | POA: Diagnosis not present

## 2020-12-13 DIAGNOSIS — K625 Hemorrhage of anus and rectum: Secondary | ICD-10-CM | POA: Diagnosis not present

## 2020-12-13 DIAGNOSIS — K648 Other hemorrhoids: Secondary | ICD-10-CM | POA: Diagnosis not present

## 2020-12-25 DIAGNOSIS — H5213 Myopia, bilateral: Secondary | ICD-10-CM | POA: Diagnosis not present

## 2021-01-13 ENCOUNTER — Encounter: Payer: Self-pay | Admitting: Family Medicine

## 2021-01-17 ENCOUNTER — Other Ambulatory Visit: Payer: Self-pay | Admitting: Family Medicine

## 2021-01-17 DIAGNOSIS — K649 Unspecified hemorrhoids: Secondary | ICD-10-CM

## 2021-01-19 DIAGNOSIS — K648 Other hemorrhoids: Secondary | ICD-10-CM | POA: Diagnosis not present

## 2021-01-21 ENCOUNTER — Other Ambulatory Visit: Payer: Self-pay | Admitting: Family Medicine

## 2021-01-21 DIAGNOSIS — K649 Unspecified hemorrhoids: Secondary | ICD-10-CM

## 2021-01-28 ENCOUNTER — Other Ambulatory Visit: Payer: Self-pay

## 2021-01-28 ENCOUNTER — Ambulatory Visit (INDEPENDENT_AMBULATORY_CARE_PROVIDER_SITE_OTHER): Payer: Medicaid Other | Admitting: Family Medicine

## 2021-01-28 VITALS — BP 104/68 | HR 73 | Wt 170.0 lb

## 2021-01-28 DIAGNOSIS — K649 Unspecified hemorrhoids: Secondary | ICD-10-CM | POA: Diagnosis not present

## 2021-01-28 DIAGNOSIS — R0781 Pleurodynia: Secondary | ICD-10-CM | POA: Diagnosis not present

## 2021-01-28 MED ORDER — PROCTOFOAM HC 1-1 % EX FOAM: 1.0000 | Freq: Two times a day (BID) | CUTANEOUS | 5 refills | Status: DC

## 2021-01-28 MED ORDER — IBUPROFEN 800 MG PO TABS: 800.0000 mg | ORAL_TABLET | Freq: Three times a day (TID) | ORAL | 0 refills | Status: DC | PRN

## 2021-01-28 MED ORDER — POLYETHYLENE GLYCOL 3350 17 G PO PACK: 17.0000 g | PACK | Freq: Every day | ORAL | 11 refills | Status: DC
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
## 2021-01-28 NOTE — Patient Instructions (Signed)
It was great seeing you today.  I am sorry your ribs are hurting.  You most likely have a bruise on your rib.  I want you to alternate taking Tylenol and 800 mg ibuprofen which I sent to your pharmacy.  I also refilled your MiraLAX as well as your hemorrhoid cream.  If you have any worsening pain, shortness of breath please seek medical attention immediately at the hospital.  If you have any questions or concerns call the clinic.  I hope you have a great afternoon!
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
## 2021-02-17 DIAGNOSIS — K648 Other hemorrhoids: Secondary | ICD-10-CM | POA: Diagnosis not present

## 2021-03-17 DIAGNOSIS — K648 Other hemorrhoids: Secondary | ICD-10-CM | POA: Diagnosis not present

## 2021-03-17 DIAGNOSIS — E669 Obesity, unspecified: Secondary | ICD-10-CM | POA: Diagnosis not present

## 2021-03-17 DIAGNOSIS — Z6831 Body mass index (BMI) 31.0-31.9, adult: Secondary | ICD-10-CM | POA: Diagnosis not present

## 2021-03-24 ENCOUNTER — Other Ambulatory Visit: Payer: Self-pay | Admitting: Family Medicine

## 2021-03-24 DIAGNOSIS — K649 Unspecified hemorrhoids: Secondary | ICD-10-CM

## 2021-03-31 ENCOUNTER — Other Ambulatory Visit: Payer: Self-pay | Admitting: Family Medicine

## 2021-05-04 ENCOUNTER — Other Ambulatory Visit: Payer: Self-pay | Admitting: Family Medicine

## 2021-06-16 DIAGNOSIS — K624 Stenosis of anus and rectum: Secondary | ICD-10-CM | POA: Diagnosis not present

## 2021-06-30 ENCOUNTER — Other Ambulatory Visit: Payer: Self-pay | Admitting: Family Medicine

## 2021-06-30 DIAGNOSIS — K624 Stenosis of anus and rectum: Secondary | ICD-10-CM | POA: Diagnosis not present

## 2021-07-25 NOTE — Patient Instructions (Addendum)
It was wonderful to see you today. ? ?Please bring ALL of your medications with you to every visit.  ? ?Today we talked about: ? ?Today at your annual preventive visit we talked about the following measures: ?I recommend 150 minutes of exercise per week-try 30 minutes 5 days per week ?We discussed reducing sugary beverages (like soda and juice) and increasing leafy greens and whole fruits.  ?We discussed avoiding tobacco and alcohol.  ?I recommend avoiding illicit substances.  ?Your blood pressure is at goal.  ? ? ?Therapy and Counseling Resources ?Most providers on this list will take Medicaid. Patients with commercial insurance or Medicare should contact their insurance company to get a list of in network providers. ? ?[facility] (spanish speaking therapist available)(habla espanol)(take medicare and medicaid)  ?[address], [place], [place] [address], [place] ?[email or web address] ?[phone] ? ?[facility]iatry and Counseling ?[address] [place], [place] [address] ?[phone] ? ?[facility]  ? [address] [place], [place] [address]      [phone] ? ?[facility] (spanish available) ?[address]  [place], [place] [address] ?[phone] ? ?[facility] (take medicaid and medicare) ?[address]  [place], [place] [address]       [phone]    ? ?[facility] (virtual only) ?[phone] ? ?[facility] ?[address], [place], [place] [phone] ? ?[facility]:  [address] [place] [phone] ? ?[facility]:  ?Coralie Carpen [phone] ? ?[facility] (under & uninsured) ?[address]   [place] [phone]    [email or web address]   ? ?[facility] ?[address]  [place]    [phone] ? ?[facility] ?[facility] [address]      Phone:  [phone]     [facility]-  [address]  [phone]  ? ?[facility] ?[address] [place], [place] [phone] ext [phone] ? ?[facility]: [address] [place], [place], [place]  [phone]  ? ?[facility] (Spanish therapist) [email or web address]  ?[address] [place]  [address]   [place] [address]    [phone]   ? ?[facility]   ?[employer],  [place], [place]  [phone]  ? ?[facility]  ?[address] [place]  [phone] ? ?[facility]  ?[address] [place], [place]        [phone] ? ?Open Access/Walk In Clinic under & uninsured ? ?[facility]  ?[address] [place], [place] ?Front Line [phone] ?Crisis [phone] ? ?[facility] [address],  ?(Spanish)   [address], [place]: [phone]) 8:30 - 12; 1 - 2:30 ? ?[facility] [employer],  ?[address], [place]    [phone]):8:30 - 12; 2 - 3PM ? ?[facility] [employer],  ?[address]  [place]; [phone]):   Mon - Fri 8 AM - 5 PM ? ?[facility] ?[address] [place]  MWF 12:30 to 3:00 or call to schedule an appointment  [phone] ? ?Specific Provider options ?[facility]  [email or web address] ?click on find a therapist  ?enter your zip code ?left side and select or tailor a therapist for your specific need.  ? ?[facility] Provider Directory ?[email or web address]  (Medicaid)  Follow all drop down to find a provider ? ?Social Support program ?[facility] ?336) I7437963 or PhotoSolver.pl ?[address], [place], [place] support and educational  ? ?24- Hour Availability:  ? ?[facility]  ?[address] [place], [place] ?Front Line [phone] ?Crisis [phone] ? ?[facility]  [phone] ? ?[employer] [facility]  [phone]  ? ?[facility] [employer]  [phone] (after hours) ? ?[facility]/Mobile Crisis   [phone] ? ?[place] National Suicide Hotline  [phone] Len Childs) ? ?Call 911 or go to emergency room ? ?[facility]  [phone]);  [place] and [place]  ? ?[facility]  ?([phone]); [place], [place], [place], [place], [place], [place], [place] ? ? ?Thank you for choosing [facility].  ? ?Please call [phone] with any questions about today's appointment. ? ?Please be sure to schedule follow up at the front  desk before you leave today.  ? ?Sabino Dick, DO ?PGY-2 Family Medicine   ?
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
## 2021-07-27 ENCOUNTER — Ambulatory Visit (INDEPENDENT_AMBULATORY_CARE_PROVIDER_SITE_OTHER): Payer: Medicaid Other | Admitting: Family Medicine

## 2021-07-27 ENCOUNTER — Encounter: Payer: Self-pay | Admitting: Family Medicine

## 2021-07-27 VITALS — BP 108/76 | HR 72 | Ht 62.0 in | Wt 169.6 lb

## 2021-07-27 DIAGNOSIS — L290 Pruritus ani: Secondary | ICD-10-CM

## 2021-07-27 DIAGNOSIS — Z13220 Encounter for screening for lipoid disorders: Secondary | ICD-10-CM

## 2021-07-27 DIAGNOSIS — R7303 Prediabetes: Secondary | ICD-10-CM | POA: Diagnosis not present

## 2021-07-27 DIAGNOSIS — K068 Other specified disorders of gingiva and edentulous alveolar ridge: Secondary | ICD-10-CM

## 2021-07-27 DIAGNOSIS — F439 Reaction to severe stress, unspecified: Secondary | ICD-10-CM | POA: Diagnosis not present

## 2021-07-27 DIAGNOSIS — K649 Unspecified hemorrhoids: Secondary | ICD-10-CM

## 2021-07-27 DIAGNOSIS — Z0000 Encounter for general adult medical examination without abnormal findings: Secondary | ICD-10-CM

## 2021-07-27 LAB — POCT GLYCOSYLATED HEMOGLOBIN (HGB A1C): HbA1c, POC (controlled diabetic range): 6 % (ref 0.0–7.0)

## 2021-07-27 MED ORDER — NYSTATIN 100000 UNIT/ML MT SUSP: 5.0000 mL | Freq: Three times a day (TID) | OROMUCOSAL | 0 refills | Status: DC | PRN

## 2021-07-27 MED ORDER — METFORMIN HCL ER 500 MG PO TB24: 500.0000 mg | ORAL_TABLET | Freq: Every day | ORAL | 1 refills | Status: DC

## 2021-07-28 ENCOUNTER — Telehealth: Payer: Self-pay | Admitting: Family Medicine

## 2021-07-28 LAB — LIPID PANEL
Chol/HDL Ratio: 7.2 ratio — ABNORMAL HIGH (ref 0.0–4.4)
Cholesterol, Total: 210 mg/dL — ABNORMAL HIGH (ref 100–199)
HDL: 29 mg/dL — ABNORMAL LOW (ref >39)
LDL Chol Calc (NIH): 160 mg/dL — ABNORMAL HIGH (ref 0–99)
Triglycerides: 115 mg/dL (ref 0–149)
VLDL Cholesterol Cal: 21 mg/dL (ref 5–40)

## 2021-07-28 NOTE — Telephone Encounter (Signed)
Have attempted 4x to reach patient to discuss lab results. No answer and voicemail is full. Though her cholesterol is slightly elevated, no medication is necessary at this time.  ?

## 2021-08-11 IMAGING — US US MFM OB DETAIL+14 WK
1 series · 13 of 28 positions shown · non-contrast
Comparison: none

[Series 1: us mfm ob detail+14 wk · 80 acquisitions, 13 frames shown]
[im 3/80]
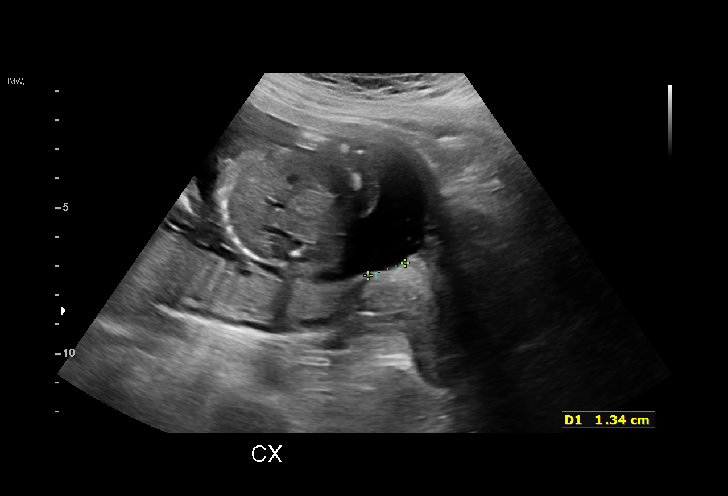
[im 9/80]
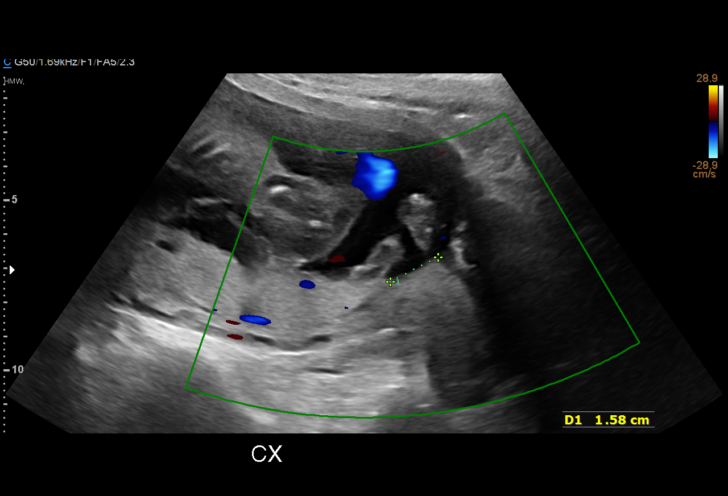
[im 15/80]
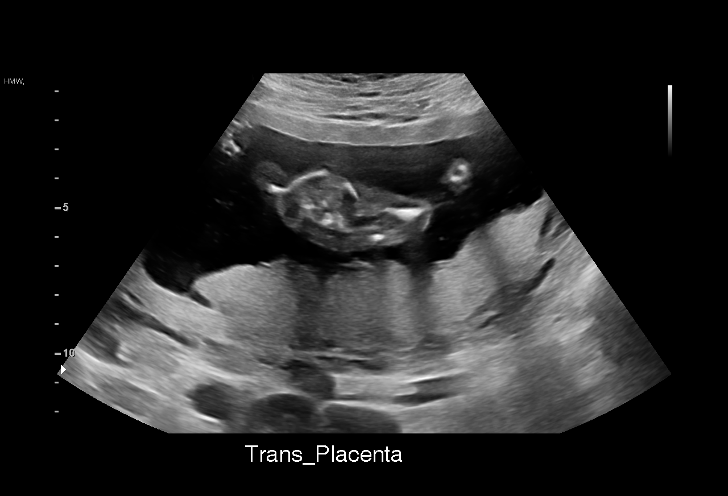
[im 21/80]
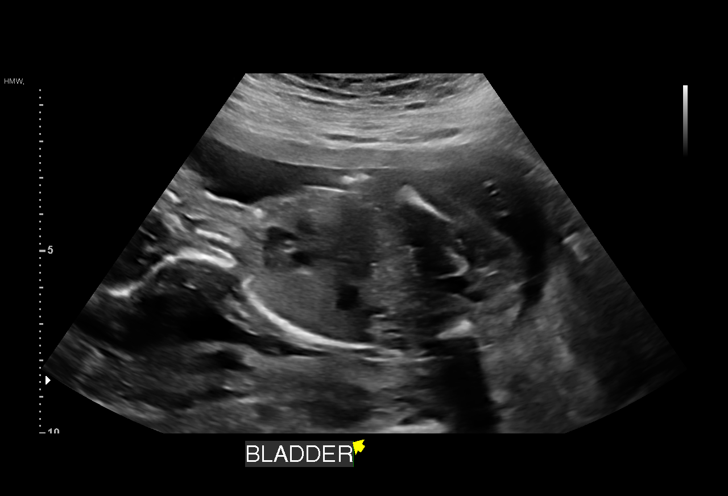
[im 27/80]
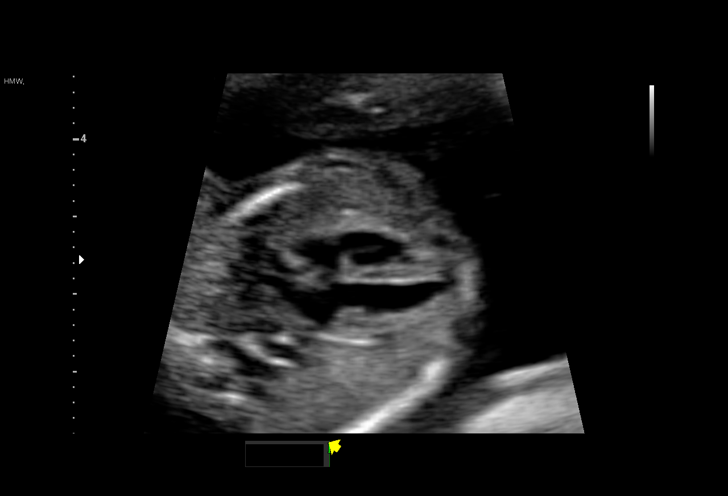
[im 33/80]
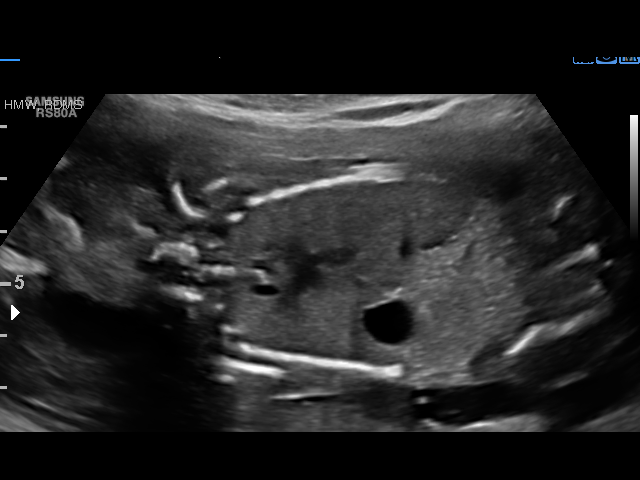
[im 41/80]
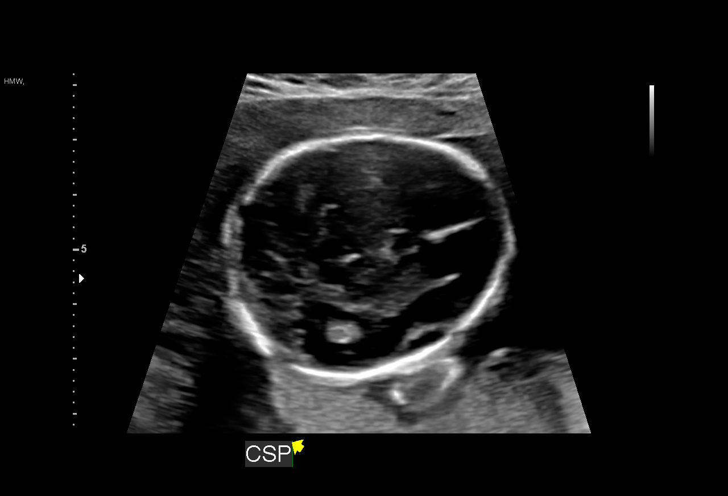
[im 47/80]
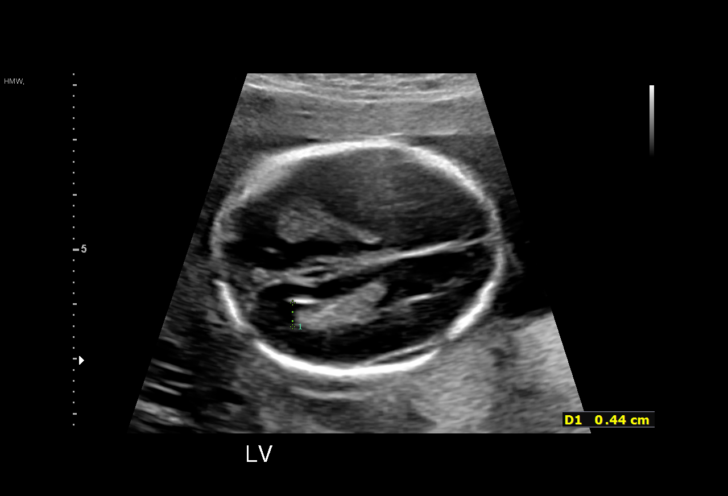
[im 53/80]
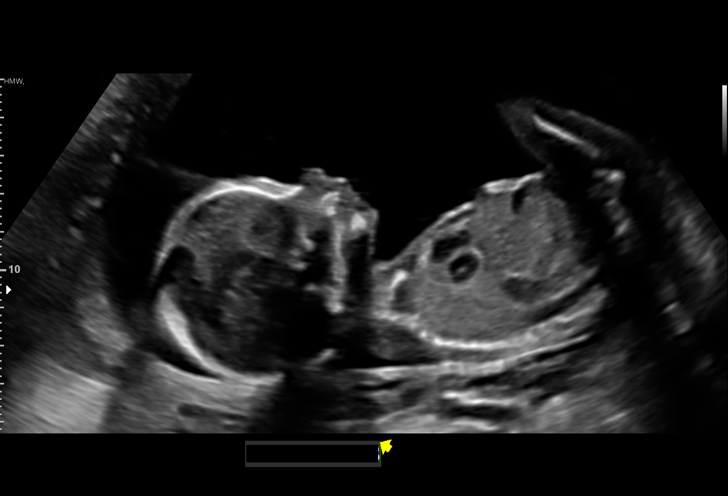
[im 59/80]
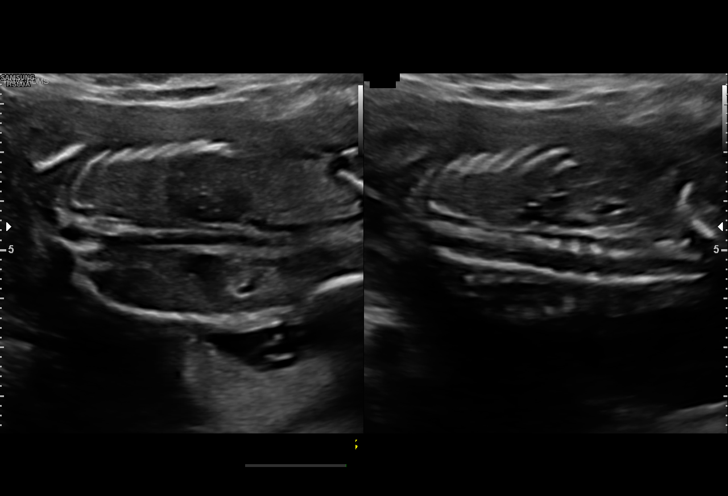
[im 65/80]
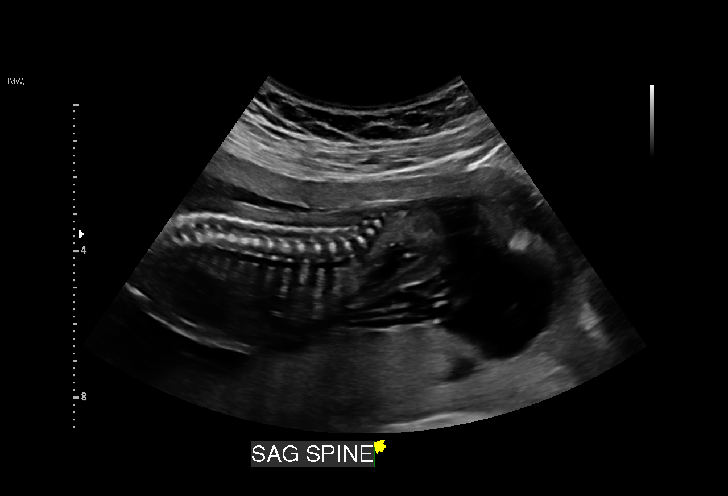
[im 71/80]
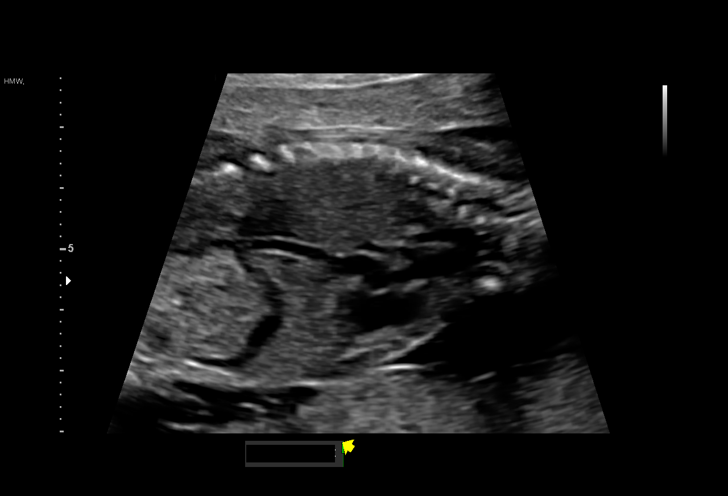
[im 77/80]
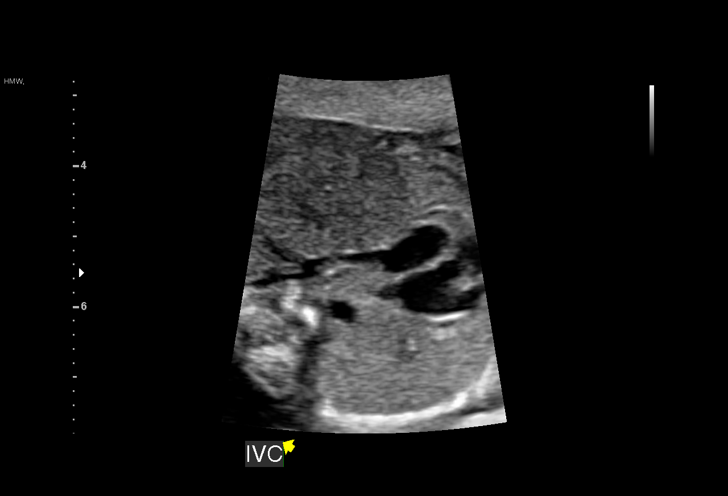

[13 of 28 positions shown; findings below may reference images not displayed]

Indications

 Obesity complicating pregnancy, second
 trimester
 19 weeks gestation of pregnancy
 Encounter for antenatal screening for
 malformations
 AFP neg
 Low lying placenta, antepartum
Fetal Evaluation

 Num Of Fetuses:         1
 Fetal Heart Rate(bpm):  143
 Cardiac Activity:       Observed
 Presentation:           Breech
 Placenta:               Posterior, low-lying, 1.2cm from int os
 P. Cord Insertion:      Not well visualized

 Amniotic Fluid
 AFI FV:      Within normal limits

                             Largest Pocket(cm)
                             4
Biometry

 BPD:        43  mm     G. Age:  19w 0d         51  %    CI:        75.95   %    70 - 86
                                                         FL/HC:      17.1   %    16.1 -
 HC:      156.4  mm     G. Age:  18w 4d         22  %    HC/AC:      1.12        1.09 -
 AC:      139.4  mm     G. Age:  19w 2d         57  %    FL/BPD:     62.1   %
 FL:       26.7  mm     G. Age:  18w 1d         15  %    FL/AC:      19.2   %    20 - 24
 HUM:      25.9  mm     G. Age:  18w 1d         26  %
 CER:      19.1  mm     G. Age:  18w 5d         19  %
 NFT:       3.6  mm

 CM:          5  mm

 Est. FW:     256  gm      0 lb 9 oz     32  %
OB History

 Gravidity:    5         Term:   3        Prem:   0        SAB:   1
 TOP:          0       Ectopic:  0        Living: 3
Gestational Age

 LMP:           18w 0d        Date:  08/12/19                 EDD:   05/18/20
 U/S Today:     18w 5d                                        EDD:   05/13/20
 Best:          19w 0d     Det. By:  Early Ultrasound         EDD:   05/11/20
                                     (09/25/19)
Anatomy

 Cranium:               Appears normal3.       LVOT:                   Appears normal
 Cavum:                 Appears normal         Aortic Arch:            Not well visualized
 Ventricles:            Appears normal         Ductal Arch:            Not well visualized
 Choroid Plexus:        Appears normal         Diaphragm:              Appears normal
 Cerebellum:            Appears normal         Stomach:                Appears normal, left
                                                                       sided
 Posterior Fossa:       Appears normal         Abdomen:                Appears normal
 Nuchal Fold:           Appears normal         Abdominal Wall:         Appears nml (cord
                                                                       insert, abd wall)
 Face:                  Appears normal         Cord Vessels:           Appears normal (3
                        (orbits and profile)                           vessel cord)
 Lips:                  Appears normal         Kidneys:                Appear normal
 Palate:                Not well visualized    Bladder:                Appears normal
 Thoracic:              Appears normal         Spine:                  Appears normal
 Heart:                 Not well visualized    Upper Extremities:      Appears normal
 RVOT:                  Not well visualized    Lower Extremities:      Appears normal

 Other:  Fetus appears to be female. Heels visualized. Technically difficult due
         to fetal position. lt 5th digit visualized. SVC?IVC visualized.
Cervix Uterus Adnexa

 Cervix
 Length:            3.6  cm.
 Normal appearance by transabdominal scan. Closed

 Uterus
 No abnormality visualized.

 Right Ovary
 No adnexal mass visualized.

 Left Ovary
 No adnexal mass visualized.
 Cul De Sac
 No free fluid seen.

 Adnexa
 No abnormality visualized.
Impression

 G5 P3.  Patient is here for fetal anatomy scan.MSAFP
 screening showed low risk for open-neural tube defects .
 She had opted not to screen for fetal aneuploidies.

 We performed fetal anatomy scan. No makers of
 aneuploidies or fetal structural defects are seen. Fetal
 biometry is consistent with her previously-established dates.
 Amniotic fluid is normal and good fetal activity is seen.
 Patient understands the limitations of ultrasound in detecting
 fetal anomalies.
 Placenta is low lying. Patient does not have symptoms of
 vaginal bleeding. We reassured that low-lying placenta
 resolves with advancing gestation. In the absence of vaginal
 bleeding, abstinence from sexual intercourse is not
 necessary.

 Maternal obesity imposes limitations on the resolution of
 images, and failure to detect fetal anomalies is more common
 in obese pregnant women. As maternal obesity makes
 clinical assessment of fetal growth difficult, we recommend
 serial growth scans until delivery.
Recommendations

 -An appointment was made for her to return in 4 weeks for
 completion of fetal anatomy.
                 Huijberts, Ali Berke

## 2021-08-18 DIAGNOSIS — K648 Other hemorrhoids: Secondary | ICD-10-CM | POA: Diagnosis not present

## 2021-08-18 DIAGNOSIS — K624 Stenosis of anus and rectum: Secondary | ICD-10-CM | POA: Diagnosis not present

## 2021-09-06 ENCOUNTER — Ambulatory Visit: Payer: Medicaid Other | Admitting: Family Medicine

## 2021-09-16 ENCOUNTER — Other Ambulatory Visit: Payer: Self-pay

## 2021-09-16 ENCOUNTER — Ambulatory Visit (INDEPENDENT_AMBULATORY_CARE_PROVIDER_SITE_OTHER): Payer: Medicaid Other | Admitting: Family Medicine

## 2021-09-16 DIAGNOSIS — B349 Viral infection, unspecified: Secondary | ICD-10-CM | POA: Insufficient documentation

## 2021-09-16 NOTE — Patient Instructions (Addendum)
It was great seeing you today!  I am sorry that you are not feeling well, it seems that you have a viral infection. This does not require antibiotics as you do not have a bacterial infection, please drink plenty of fluids and get rest. Symptoms should improve, if they do not improve in 7 days then please contact our office to be seen again.   Honey and warm tea can help with your cough. The cough may take awhile before it completely goes away, this is normal.   Please follow up at your next scheduled appointment, if anything arises between now and then, please don't hesitate to contact our office.   Thank you for allowing [place] to be a part of your medical care!  Thank you, Dr. Robyne Peers

## 2021-09-16 NOTE — Progress Notes (Signed)
    SUBJECTIVE:   CHIEF COMPLAINT / HPI:   Patient presents with sick symptoms since 3 days. Started to have chills and then a fever with Tmax 103. Also reports congestion, cough with yellowish clear phlegm. Denies vision changes, chest pain, shortness of breath, nausea and vomiting. Sick contacts include son who has had similar symptoms.   OBJECTIVE:   BP 109/72   Pulse 98   Temp 99.5 F (37.5 C)   Wt 172 lb 9.6 oz (78.3 kg)   SpO2 100%   BMI 31.57 kg/m   General: Patient tired appearing, in no acute distress. HEENT: PERRLA, non-bulging TM bilaterally without erythema or drainage noted, non-tender thyroid, mild evidence of anterior cervical LAD CV: RRR, no murmurs or gallops auscultated Resp: CTAB, no wheezing, rales or rhonchi noted  ASSESSMENT/PLAN:   Viral illness -symptoms seem most consistent with viral etiology, no indication of focal findings to suggest pneumonia or bacterial etiology -reassurance provided -supportive care discussed      Reece Leader, DO [facility]

## 2021-09-16 NOTE — Assessment & Plan Note (Signed)
-  symptoms seem most consistent with viral etiology, no indication of focal findings to suggest pneumonia or bacterial etiology -reassurance provided -supportive care discussed

## 2021-09-29 ENCOUNTER — Telehealth: Payer: Self-pay | Admitting: Family Medicine

## 2021-09-29 NOTE — Telephone Encounter (Signed)
Called patient using an interpreter, there was no answer to the phone call so a voicemail was left with the call back number for the office.

## 2021-10-03 NOTE — Progress Notes (Deleted)
  Subjective:     Patient ID: Paula Stark, female   DOB: 14-Mar-1991, 31 y.o.   MRN: [id number]  HPI: Pt is a 31 y.o. female here for Nexplanon check-up. Nexplanon placed March 2022.     Review of Systems     Objective:   Physical Exam     Assessment:     ***    Plan:     ***

## 2021-10-04 ENCOUNTER — Ambulatory Visit: Payer: Medicaid Other | Admitting: Advanced Practice Midwife

## 2021-10-10 ENCOUNTER — Ambulatory Visit: Payer: Medicaid Other | Admitting: Obstetrics and Gynecology

## 2021-11-16 IMAGING — US US FETAL BPP W/ NON-STRESS
1 series · 13 of 14 positions shown · non-contrast
Comparison: none

[Series 1: us fetal bpp w/ non-stress · 14 acquisitions, 13 frames shown]
[im 1/14]
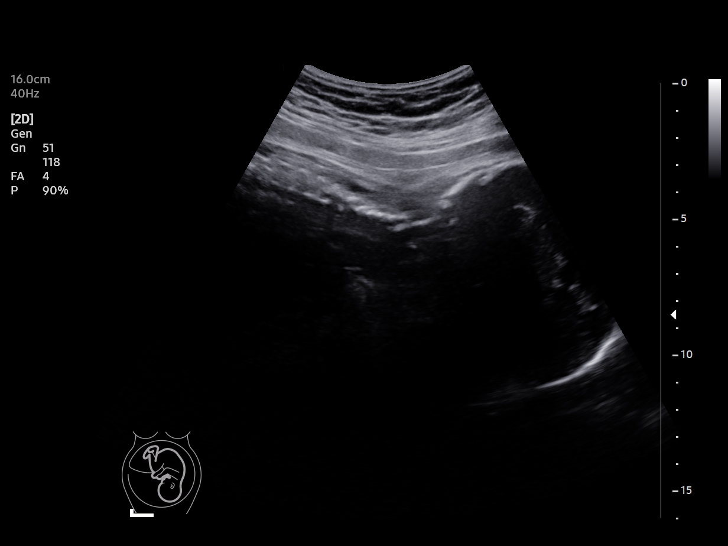
[im 2/14]
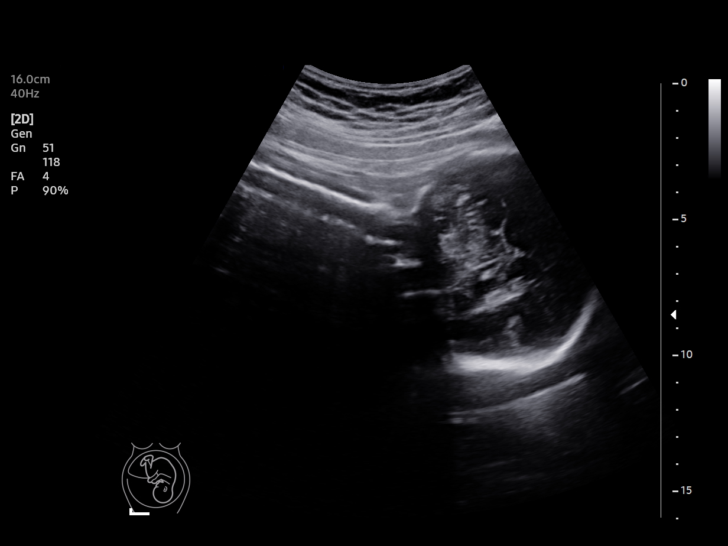
[im 3/14]
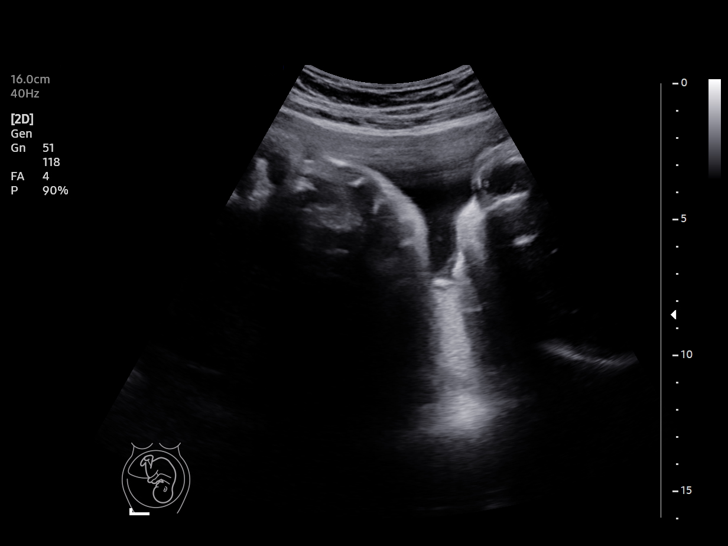
[im 4/14]
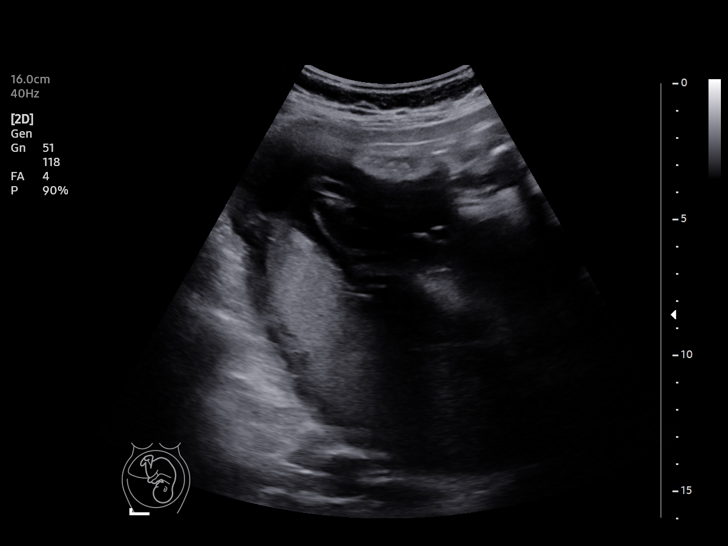
[im 5/14]
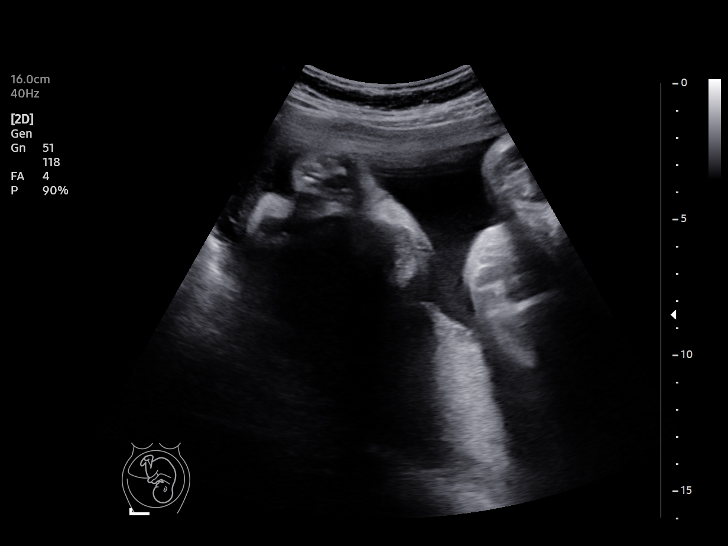
[im 6/14]
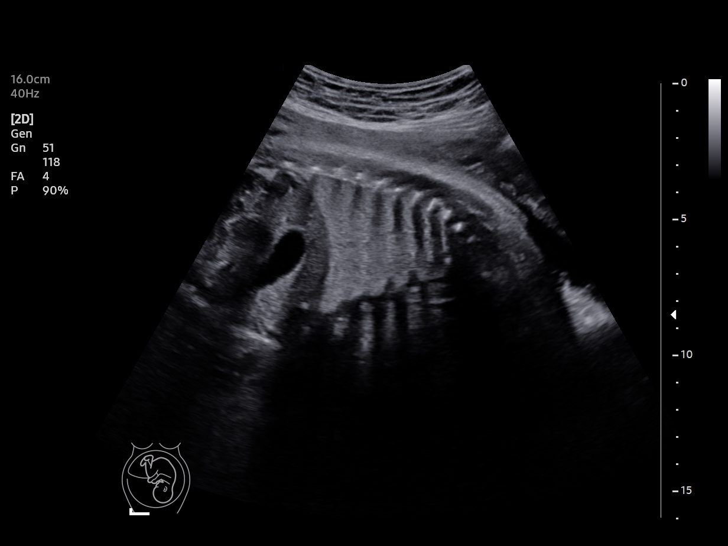
[im 8/14]
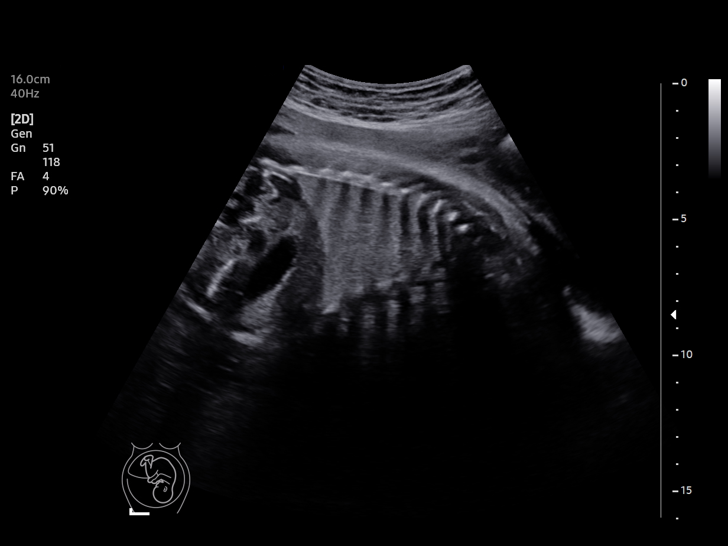
[im 9/14]
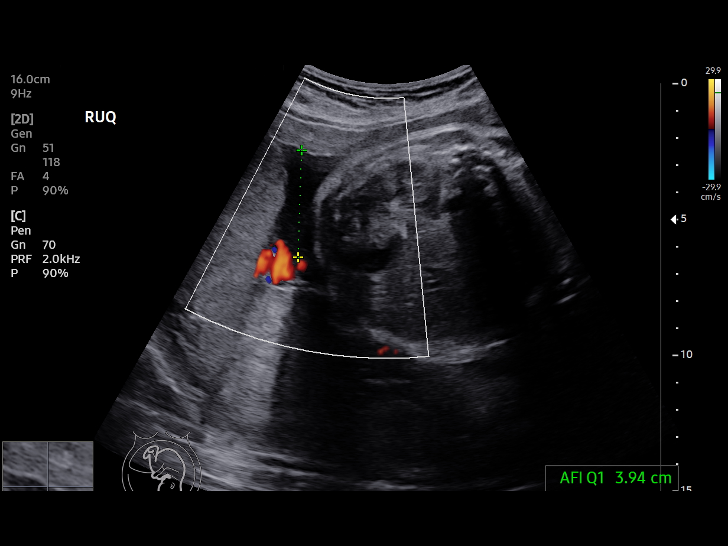
[im 10/14]
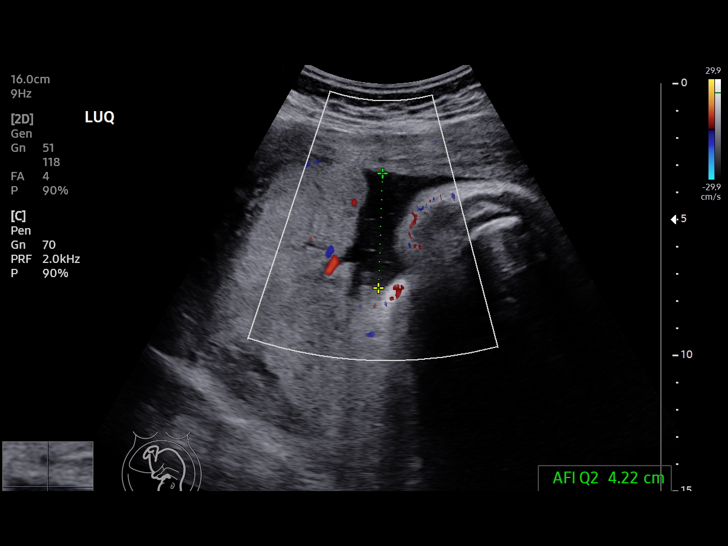
[im 11/14]
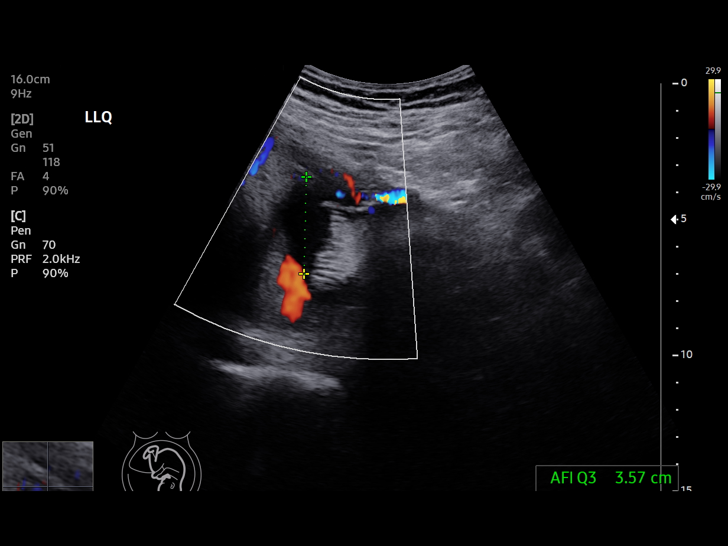
[im 12/14]
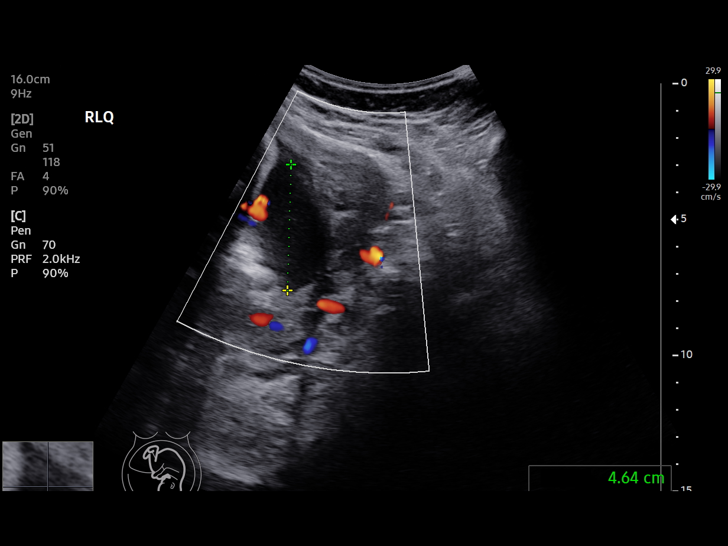
[im 13/14]
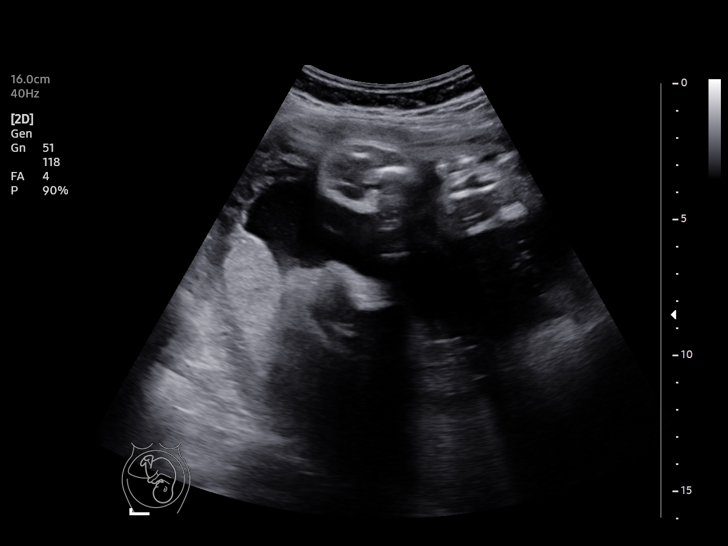
[im 14/14]
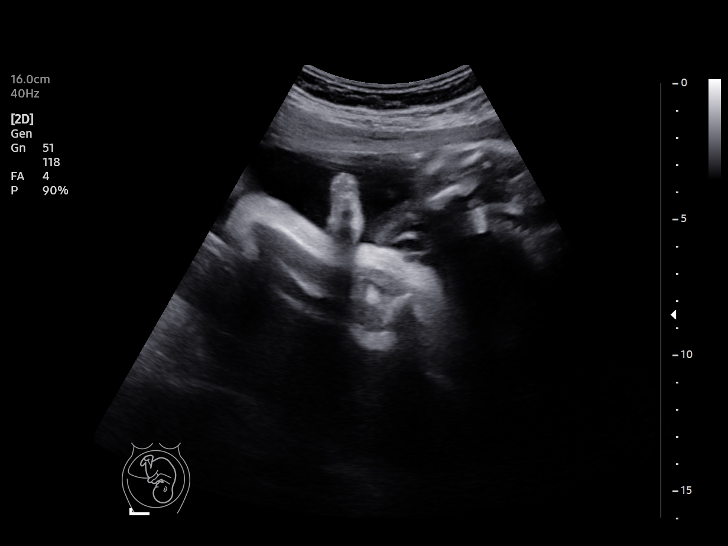

[13 of 14 positions shown; findings below may reference images not displayed]

[REDACTED]care at

 1  US FETAL BPP W/NONSTRESS              76818.4     RIMITA PAULIS

Service(s) Provided

Indications

 32 weeks gestation of pregnancy
 Gestational diabetes in pregnancy,
 controlled by oral hypoglycemic drugs
Fetal Evaluation

 Num Of Fetuses:         1
 Preg. Location:         Intrauterine
 Cardiac Activity:       Observed
 Presentation:           Cephalic

 Amniotic Fluid
 AFI FV:      Within normal limits

 AFI Sum(cm)     %Tile       Largest Pocket(cm)
 16.3            59

 RUQ(cm)       RLQ(cm)       LUQ(cm)        LLQ(cm)

Biophysical Evaluation
 Amniotic F.V:   Pocket => 2 cm             F. Tone:        Observed
 F. Movement:    Observed                   N.S.T:          Reactive
 F. Breathing:   Observed                   Score:          [DATE]
OB History

 Gravidity:    5         Term:   3        Prem:   0        SAB:   1
 TOP:          0       Ectopic:  0        Living: 3
Gestational Age

 LMP:           31w 6d        Date:  08/12/19                 EDD:   05/18/20
 Best:          32w 6d     Det. By:  Early Ultrasound         EDD:   05/11/20
                                     (09/25/19)
Impression

 BPP [DATE]
Recommendations

 -Continue weekly BPP till delivery.
                  Cimer, Ardit R

## 2021-11-17 DIAGNOSIS — K624 Stenosis of anus and rectum: Secondary | ICD-10-CM | POA: Diagnosis not present

## 2021-11-24 DIAGNOSIS — K6289 Other specified diseases of anus and rectum: Secondary | ICD-10-CM | POA: Diagnosis not present

## 2021-11-24 DIAGNOSIS — K624 Stenosis of anus and rectum: Secondary | ICD-10-CM | POA: Diagnosis not present

## 2022-01-04 DIAGNOSIS — K624 Stenosis of anus and rectum: Secondary | ICD-10-CM | POA: Diagnosis not present

## 2022-01-04 DIAGNOSIS — E669 Obesity, unspecified: Secondary | ICD-10-CM | POA: Diagnosis not present

## 2022-01-04 DIAGNOSIS — K6289 Other specified diseases of anus and rectum: Secondary | ICD-10-CM | POA: Diagnosis not present

## 2022-01-04 DIAGNOSIS — E119 Type 2 diabetes mellitus without complications: Secondary | ICD-10-CM | POA: Diagnosis not present

## 2022-02-23 DIAGNOSIS — K624 Stenosis of anus and rectum: Secondary | ICD-10-CM | POA: Diagnosis not present

## 2022-02-27 ENCOUNTER — Encounter: Payer: Self-pay | Admitting: *Deleted

## 2022-02-27 NOTE — Progress Notes (Signed)
No v/s or data documented from 01/28/22 screening event - pt's PCP is Dr. Melba Coon  where he had her CPX on 07/27/21, (followed by Dr. Robyne Peers teleleath visit on 09/16/21 ) at [facility]. She also has active colon/GI, and ob/gyn provider coverage over the past 12 months. No additional health equity team support indicated at this time.

## 2022-04-25 ENCOUNTER — Ambulatory Visit (INDEPENDENT_AMBULATORY_CARE_PROVIDER_SITE_OTHER): Payer: Medicaid Other | Admitting: Family Medicine

## 2022-04-25 VITALS — HR 88 | Wt 167.0 lb

## 2022-04-25 DIAGNOSIS — K0889 Other specified disorders of teeth and supporting structures: Secondary | ICD-10-CM | POA: Diagnosis not present

## 2022-04-25 DIAGNOSIS — F4323 Adjustment disorder with mixed anxiety and depressed mood: Secondary | ICD-10-CM | POA: Diagnosis present

## 2022-04-25 MED ORDER — IBUPROFEN 800 MG PO TABS: ORAL_TABLET | ORAL | 0 refills | Status: DC

## 2022-04-25 MED ORDER — HYDROXYZINE HCL 25 MG PO TABS: 25.0000 mg | ORAL_TABLET | Freq: Three times a day (TID) | ORAL | 0 refills | Status: DC | PRN

## 2022-04-25 MED ORDER — SERTRALINE HCL 50 MG PO TABS: ORAL_TABLET | ORAL | 3 refills | Status: DC

## 2022-04-25 NOTE — Progress Notes (Signed)
SUBJECTIVE:   CHIEF COMPLAINT / HPI:   Video [name] interpreter used for entirety of visit.  Paula Stark is a 32 y.o. female who presents to the [facility] clinic today accompanied by her family friend to discuss the following concerns:   Anxiety, Depressed Mood Mood symptoms started after her visit to the dentist on 1/12. She reports that the office did not use an interpreter and that she was not given clear explanations of what they were going to do. She was under the impression she would be having a few fillings but they ended up doing 15 total. She reports difficultly eating hard foods on her left-side since visit. She feels traumatized from that visit and has been tearful and anxious. She has no longer felt joy in the things she used to feel. She initially kept all of her emotions to herself but after a few days finally opened up to her family friend (the one present with her today).  Her friend had noticed that something was off and she was not her normal happy self.  Her friend helped her to establish with another dentist who apparently has had to revise some of the previous fillings. She has been taking Ibuprofen as needed for her tooth pains.  Her friend reports that she is still very anxious and nervous to see even the "good" dentist since her last visit. She is nervous around things concerning her teeth. She admits to having suicidal thoughts following the procedure but denies having any more of those.  She denies thoughts of self-harm or HI.  She is interested in mediation and therapy options to help her feel better.   PERTINENT  PMH / PSH: N/A  OBJECTIVE:   Pulse 88   Wt 167 lb (75.8 kg)   SpO2 100%   BMI 30.54 kg/m    General: Tearful, depressed affect  Respiratory: normal effort     04/25/2022    2:24 PM 07/27/2021   10:49 AM 01/28/2021   11:55 AM  Depression screen PHQ 2/9  Decreased Interest 3 0 1  Down, Depressed, Hopeless 3 0 0  PHQ - 2 Score 6 0 1  Altered sleeping  0  0  Tired, decreased energy  0 1  Change in appetite  0 0  Feeling bad or failure about yourself   0 0  Trouble concentrating  0 0  Moving slowly or fidgety/restless  0 0  Suicidal thoughts 1 0   PHQ-9 Score  0 2  Difficult doing work/chores  Not difficult at all       04/25/2022    2:11 PM 04/28/2020   10:09 AM 04/21/2020    8:45 AM 04/12/2020    8:52 AM  GAD 7 : Generalized Anxiety Score  Nervous, Anxious, on Edge 3 0 0 0  Control/stop worrying 3 0 0 0  Worry too much - different things 3 0 0 0  Trouble relaxing 3 1 1 1   Restless 1 1 1 1   Easily annoyed or irritable 2 0 0 0  Afraid - awful might happen 1 0 0 0  Total GAD 7 Score 16 2 2 2     ASSESSMENT/PLAN:   1. Situational mixed anxiety and depressive disorder Elevated PHQ-9 and GAD-7 scores. Ongoing since her dental visit 1/12. Would benefit from both medications and therapy.  - sertraline (ZOLOFT) 50 MG tablet; Take half a tablet (25 mg) for 1 week and then increase to 1 tablet (50 mg) daily.  Dispense:  30 tablet; Refill: 3 - hydrOXYzine (ATARAX) 25 MG tablet; Take 1 tablet (25 mg total) by mouth 3 (three) times daily as needed for anxiety.  Dispense: 30 tablet; Refill: 0 - F/u scheduled 2/27 to check in   2. Tooth pain Following with her new dentist for ongoing tooth concerns. Requested refill of her Ibuprofen. We did discuss possibility of increased GI bleeding with SSRI and NSAIDs, she is aware.  - ibuprofen (ADVIL) 800 MG tablet; TAKE 1 TABLET(800 MG) BY MOUTH EVERY 8 HOURS AS NEEDED  Dispense: 30 tablet; Refill: 0 - Recommended lidocaine swishes     [name], DO [place]

## 2022-04-25 NOTE — Progress Notes (Signed)
Patient tearful while rooming and obtaining vital signs. Informed patient that I would check BP after she had visit with provider. Provider informed that patient was roomed, however, I would check BP prior to patient leaving.   Patient left prior to blood pressure being taken. Patient has follow up visit with PCP on 2/27. Will obtain BP at this visit.   Talbot Grumbling, RN

## 2022-04-25 NOTE — Patient Instructions (Addendum)
It was wonderful to see you today.  Please bring ALL of your medications with you to every visit.   Today we talked about:  I recommend finding a therapist in the list below to help.  I am starting you on a couple medications called Sertraline (daily medication) and hydroxyzine (as needed).   I would recommend lidocaine or benzocaine swishes to help with the mouth discomfort.  I have refilled your Ibuprofen. We discussed being cautious with the Sertraline and Ibuprofen together.     Therapy and Counseling Resources Most providers on this list will take Medicaid. Patients with commercial insurance or Medicare should contact their insurance company to get a list of in network providers.  [employer] [employer] (takes children) Location 1: [address], [place], [place] [address] Location 2: [place], [place] [address] [place] ([place] speaking therapist available)(habla espanol)(take medicare and medicaid)  [place], [place], [place] [address], [place] [email or web address] [phone]  [facility]:Psychiatry and Counseling [address] [place], [place] [address] [place], [place], [place] [address]      [phone]  [place] (spanish available) [place], [place] [address] [place] (take [facility] and medicare) [address], [place],  [address]       [phone]     [facility] (virtual only) [phone]  Jinny Blossom [facility] [address] [place], [place]  [facility]:  [place]. [place] [phone]  [facility]:  [place] [address] [phone]  [facility] (under & uninsured) [address], [place] [phone]    [email or web address]     [place] [address]  [place]    [phone]  [facility] [place]     Phone:  [phone]     [place]  [place] [address]      [place], [place] ext [place]: [place], [place], [place]   [place] ([place] therapist) [email or web address]  [place] [address]   [place] [address]    [phone]    [facility]   [address],  [place], [place]   [place]  [place]  [facility]  [address] [place], [place]        [phone]  Open Access/Walk In Clinic under & uninsured  [facility]  [address] [place], [place] Crisis [phone]  [facility] the [place],  ([place])   [place], [place]: [phone]) 8:30 - 12; 1 - 2:30  [facility] Service of the [place],  [place], [place]    [phone]):8:30 - 12; 2 - 3PM  [facility] [employer],  [address]  [place]; [phone]):   Mon - Fri 8 AM - 5 PM  [facility] [place]  MWF 12:30 to 3:00 or call to schedule an appointment  [phone]  Specific Provider options Psychology Today  [email or web address] click on find a therapist  enter  your zip code left side and select or tailor a therapist for your specific need.   [facility] Provider Directory [email or web address]  (Medicaid)   Follow all drop down to find a provider  [place] or [email or web address] [address], [place], [place] Recovery support and educational   24- Hour Availability:   [facility]  [address] [place], [place] Crisis [phone]  Family Service of the [employer] [phone]  [place]  [phone]   [place]  [phone] (after hours)  [facility]/Mobile Crisis   [phone]  [place] National Suicide Hotline  [phone] Diamantina Monks)  Call 911 or go to emergency room  [facility]  [phone]);  [place] and [employer]  [phone]); [place], [place], [place], [place], [place], [place], [place]   If you are feeling suicidal or depression symptoms worsen please immediately go to:   If you are thinking about harming yourself or having thoughts of suicide, or if you know someone who is, seek help right away. If you are in crisis, make sure you are not left alone.  If someone else is in crisis, make sure he/she/they is not left alone  Call 988 OR 1-800-273-TALK  24 Hour Availability for [place]  [address] [place], [place] Crisis [phone]    Other crisis resources:  Family Service of the [employer] (Domestic Violence, Rape & Victim Assistance [phone]  [facility] [place]    (ONLY from 8am-4pm)    [phone]  [facility] Mobile Crisis Unit (24/7)   [phone]  [place] National Suicide Hotline   [phone] Diamantina Monks)   Thank you for coming to your visit as scheduled. We have had a large "no-show" problem lately, and this significantly limits our ability to see and care for patients. As a friendly reminder- if you cannot make your appointment please call to cancel. We do have a no show policy for those who do not cancel within 24 hours. Our policy is that if you miss or fail to cancel an appointment within 24 hours, 3 times in a 32-month period, you may be dismissed from our clinic.    Thank you for choosing [facility].   Please call [phone] with any questions about today's appointment.  Please be sure to schedule follow up at the front  desk before you leave today.   [name], DO PGY-3 Family Medicine

## 2022-04-27 ENCOUNTER — Ambulatory Visit: Payer: Medicaid Other | Admitting: Family Medicine

## 2022-05-15 NOTE — Patient Instructions (Incomplete)
It was wonderful to see you today.  Please bring ALL of your medications with you to every visit.   Today we talked about:  I am so glad that you are feeling better with the medications! We will increase the dose of your Sertraline. I have also refilled your hydroxyzine (which you can use up to three times a day as needed).  We discussed doing Ibuprofen for 5 days to see if this helps your bleeding. Spotting can be common with the Nexplanon. If this becomes bothersome to you we discussed other options to remove the Nexplanon and replace it or try a different form of birth control.   Thank you for coming to your visit as scheduled. We have had a large "no-show" problem lately, and this significantly limits our ability to see and care for patients. As a friendly reminder- if you cannot make your appointment please call to cancel. We do have a no show policy for those who do not cancel within 24 hours. Our policy is that if you miss or fail to cancel an appointment within 24 hours, 3 times in a 18-monthperiod, you may be dismissed from our clinic.   Thank you for choosing [facility]   Please call [phone] any questions about today's appointment.  Please be sure to schedule follow up at the front  desk before you leave today.   ASharion Settler DO PGY-3 Family Medicine

## 2022-05-15 NOTE — Progress Notes (Signed)
SUBJECTIVE:   CHIEF COMPLAINT / HPI:   Paula Stark is a 32 y.o. female who presents to the [facility] clinic today to discuss the following concerns:   Paula Stark interpreter used for entirety of visit. [id number]  Mood Follow Up Last seen in clinic on 2/6 for mixed situational anxiety and depression following a dental extraction. She was started on Sertraline daily and Atarax PRN as well as given resources for therapy. She returns today for follow up.   She feels that since starting both medications her mood has improved. She is using the Atarax sometimes twice a day, sometimes not at all. No adverse effects from the medications.   AUB She has Nexplanon which was placed 06/04/2020. She reports she did not have period for 1.5 years but then started to have irregular periods. Some days are heavy, some days are light. Reports her menses has been going on for one month but it is very light. Has some lower back pain. Denies any dysuria.    PERTINENT  PMH / PSH: Language-barrier affecting healthcare  OBJECTIVE:   BP 118/86   Pulse 79   Ht '5\' 2"'$  (1.575 m)   Wt 166 lb 2 oz (75.4 kg)   SpO2 100%   BMI 30.38 kg/m    General: NAD, pleasant, able to participate in exam Mouth: No gingival pallor. No tooth erosions, normal gum line  Respiratory: normal effort Psych: Normal affect and mood     05/16/2022    9:13 AM 04/25/2022    2:24 PM 07/27/2021   10:49 AM  Depression screen PHQ 2/9  Decreased Interest 1 3 0  Down, Depressed, Hopeless 1 3 0  PHQ - 2 Score 2 6 0  Altered sleeping 0  0  Tired, decreased energy 1  0  Change in appetite   0  Feeling bad or failure about yourself  0  0  Trouble concentrating 1  0  Moving slowly or fidgety/restless 0  0  Suicidal thoughts 0 1 0  PHQ-9 Score 4  0  Difficult doing work/chores Somewhat difficult  Not difficult at all    ASSESSMENT/PLAN:   1. Situational mixed anxiety and depressive disorder PHQ-9 improved and patient subjectively feels that her  mood is improved on medications. She has not started therapy and is not interested in it at this time since she is busy. She would like to increase her dose of Sertraline. No SI.  - hydrOXYzine (ATARAX) 25 MG tablet; Take 1 tablet (25 mg total) by mouth 3 (three) times daily as needed for anxiety.  Dispense: 30 tablet; Refill: 0 - sertraline (ZOLOFT) 100 MG tablet; Take 1 tablet (100 mg total) by mouth daily.  Dispense: 30 tablet; Refill: 1 - F/u scheduled in April (per pt preference, her Medicaid will renew April 1st).   2. Tooth pain S/p multiple tooth extractions. Still some intermittent itching symptoms. Reports peridex rinse has helped her with this in the past. Examination benign. Would like information on new dentists that accept Medicaid.  - chlorhexidine (PERIDEX) 0.12 % solution; Use as directed 15 mLs in the mouth or throat 2 (two) times daily.  Dispense: 120 mL; Refill: 0 - Medicaid dental list provided.   3. Abnormal Uterine bleeding (AUB) Prolonged spotting without signs of anemia. Likely 2/2 Nexplanon. We discussed 5 days of Ibuprofen to see if this would help. Could also consider estradiol if not improved or removal/replacement of Nexplanon or trial of other contraceptive.    Paula Stark,  [place]

## 2022-05-16 ENCOUNTER — Encounter: Payer: Self-pay | Admitting: Family Medicine

## 2022-05-16 ENCOUNTER — Ambulatory Visit (INDEPENDENT_AMBULATORY_CARE_PROVIDER_SITE_OTHER): Payer: Medicaid Other | Admitting: Family Medicine

## 2022-05-16 VITALS — BP 118/86 | HR 79 | Ht 62.0 in | Wt 166.1 lb

## 2022-05-16 DIAGNOSIS — K0889 Other specified disorders of teeth and supporting structures: Secondary | ICD-10-CM | POA: Diagnosis present

## 2022-05-16 DIAGNOSIS — N939 Abnormal uterine and vaginal bleeding, unspecified: Secondary | ICD-10-CM | POA: Diagnosis not present

## 2022-05-16 DIAGNOSIS — F4323 Adjustment disorder with mixed anxiety and depressed mood: Secondary | ICD-10-CM | POA: Diagnosis not present

## 2022-05-16 MED ORDER — HYDROXYZINE HCL 25 MG PO TABS: 25.0000 mg | ORAL_TABLET | Freq: Three times a day (TID) | ORAL | 0 refills | Status: DC | PRN

## 2022-05-16 MED ORDER — SERTRALINE HCL 100 MG PO TABS: 100.0000 mg | ORAL_TABLET | Freq: Every day | ORAL | 1 refills | Status: DC

## 2022-05-16 MED ORDER — CHLORHEXIDINE GLUCONATE 0.12 % MT SOLN: 15.0000 mL | Freq: Two times a day (BID) | OROMUCOSAL | 0 refills | Status: DC

## 2022-05-18 DIAGNOSIS — K624 Stenosis of anus and rectum: Secondary | ICD-10-CM | POA: Diagnosis not present

## 2022-05-18 DIAGNOSIS — K648 Other hemorrhoids: Secondary | ICD-10-CM | POA: Diagnosis not present

## 2022-06-14 ENCOUNTER — Other Ambulatory Visit: Payer: Self-pay | Admitting: Family Medicine

## 2022-06-14 DIAGNOSIS — K0889 Other specified disorders of teeth and supporting structures: Secondary | ICD-10-CM

## 2022-06-20 ENCOUNTER — Ambulatory Visit: Payer: Self-pay | Admitting: Family Medicine

## 2022-06-20 NOTE — Progress Notes (Signed)
Corrected 5in5 documentation

## 2022-06-26 ENCOUNTER — Ambulatory Visit: Payer: Medicaid Other | Admitting: Family Medicine

## 2022-07-04 NOTE — Progress Notes (Unsigned)
    SUBJECTIVE:   CHIEF COMPLAINT / HPI:   Paula Stark is a 32 y.o. female who presents to the [facility] clinic today to discuss the following concerns:   Patient declines interpreter today.   Anxiety/Depression Follow Up Last seen on 2/27 for follow-up of her anxiety/depression.  Her symptoms started following a dental extraction. At last visit she reported feeling improved with Sertraline and Atarax. She was not interested in therapy at that time due to her busy schedule. Her Sertraline was increased to 100 mg daily.   She feels much improved since last visit. In much brighter spirits. She is still taking her Sertraline 100 mg. Is not having to take the Hydroxyzine lately.   Requests refill on chlorhexidine solution.   PERTINENT  PMH / PSH: Prediabetes   OBJECTIVE:   BP 118/79   Pulse 71   Ht  (1.575 m)   Wt 170 lb 6.4 oz (77.3 kg)   SpO2 100%   BMI 31.17 kg/m    General: NAD, pleasant, able to participate in exam Cardiac: RRR, no murmurs. Respiratory: CTAB, normal effort, No wheezes, rales or rhonchi Psych: Normal affect and mood     07/05/2022   10:53 AM 05/16/2022    9:13 AM 04/25/2022    2:24 PM  Depression screen PHQ 2/9  Decreased Interest 0 1 3  Down, Depressed, Hopeless 0 1 3  PHQ - 2 Score 0 2 6  Altered sleeping 0 0   Tired, decreased energy 0 1   Change in appetite 0    Feeling bad or failure about yourself  0 0   Trouble concentrating 0 1   Moving slowly or fidgety/restless 0 0   Suicidal thoughts 0 0 1  PHQ-9 Score 0 4   Difficult doing work/chores  Somewhat difficult       07/05/2022   10:59 AM 04/25/2022    2:11 PM 04/28/2020   10:09 AM 04/21/2020    8:45 AM  GAD 7 : Generalized Anxiety Score  Nervous, Anxious, on Edge 0 3 0 0  Control/stop worrying 1 3 0 0  Worry too much - different things 0 3 0 0  Trouble relaxing 0 Restless 0 Easily annoyed or irritable 0 2 0 0  Afraid - awful might happen 0 1 0 0  Total GAD 7 Score ASSESSMENT/PLAN:   1. Tooth pain Much improved from previous.  She does request refill on her Peridex solution.  Also requesting refill on ibuprofen.  Uses only seldomly. Discussed increased risk of GI bleeds when combined with sertraline. Would recommend Tylenol as first-line for her pain but will send limited supply. - chlorhexidine (PERIDEX) 0.12 % solution; RINSE AND GARGLE 15 ML BY MOUTH OR THROAT TWICE DAILY AS DIRECTED  Dispense: 473 mL; Refill: 0 - ibuprofen (ADVIL) 600 MG tablet; Take 1 tablet (600 mg total) by mouth every 8 (eight) hours as needed.  Dispense: 30 tablet; Refill: 0  2. Situational mixed anxiety and depressive disorder Much better spirits today.  Reports significant improvement with both depression and anxiety.  Has been getting out walking more in the parks.  Only seldomly taking her hydroxyzine.  Continues to take sertraline without any adverse effects. -Continue sertraline 100 mg daily -Follow-up PRN    Sabino Dick, DO [facility]

## 2022-07-04 NOTE — Patient Instructions (Addendum)
It was wonderful to see you today.  Please bring ALL of your medications with you to every visit.   Today we talked about:  I have refilled your Ibuprofen. Take this only as needed for pain. Take with food. You do have an increased risk of bleeding. I would take Tylenol first before reaching for the Ibuprofen.  I am so glad that you are feeling better! You seem in much brighter spirits today. Continue your Sertraline 100 mg daily.   I have also refilled the mouth solution.   Thank you for coming to your visit as scheduled. We have had a large "no-show" problem lately, and this significantly limits our ability to see and care for patients. As a friendly reminder- if you cannot make your appointment please call to cancel. We do have a no show policy for those who do not cancel within 24 hours. Our policy is that if you miss or fail to cancel an appointment within 24 hours, 3 times in a 68-month period, you may be dismissed from our clinic.   Thank you for choosing [facility].   Please call [phone] with any questions about today's appointment.  Please be sure to schedule follow up at the front  desk before you leave today.   Sabino Dick, DO PGY-3 Family Medicine    Sciatica Rehab Ask your health care provider which exercises are safe for you. Do exercises exactly as told by your health care provider and adjust them as directed. It is normal to feel mild stretching, pulling, tightness, or discomfort as you do these exercises. Stop right away if you feel sudden pain or your pain gets worse. Do not begin these exercises until told by your health care provider. Stretching and range-of-motion exercises These exercises warm up your muscles and joints and improve the movement and flexibility of your hips and back. These exercises also help to relieve pain, numbness, and tingling. Sciatic nerve glide  Sit in a chair with your head facing down toward your chest. Place  your hands behind your back. Let your shoulders slump forward. Slowly straighten one of your legs while you tilt your head back as if you are looking toward the ceiling. Only straighten your leg as far as you can without making your symptoms worse. Hold this position for __________ seconds. Slowly return your leg and head back to the starting position. Repeat with your other leg. Repeat __________ times. Complete this exercise __________ times a day. Knee to chest with hip adduction and internal rotation  Lie on your back on a firm surface with both legs straight. Bend one of your knees and move it up toward your chest until you feel a gentle stretch in your lower back and buttock. Then, move your knee toward the shoulder that is on the opposite side from your leg. This is hip adduction and internal rotation. Hold your leg in this position by holding on to the front of your knee. Hold this position for __________ seconds. Slowly return to the starting position. Repeat with your other leg. Repeat __________ times. Complete this exercise __________ times a day. Prone extension on elbows  Lie on your abdomen on a firm surface. A bed may be too soft for this exercise. Prop yourself up on your elbows. Use your arms to help lift your chest up until you feel a gentle stretch in your abdomen and your lower back. This will place some of your body weight on your elbows. If this  is uncomfortable, try stacking pillows under your chest. Your hips should stay down, against the surface that you are lying on. Keep your hip and back muscles relaxed. Hold this position for __________ seconds. Slowly relax your upper body and return to the starting position. Repeat __________ times. Complete this exercise __________ times a day. Strengthening exercises These exercises build strength and endurance in your back. Endurance is the ability to use your muscles for a long time, even after they get tired. Pelvic  tilt This exercise strengthens the muscles that lie deep in the abdomen. Lie on your back on a firm surface. Bend your knees and keep your feet flat on the surface. Tense your abdominal muscles. Tip your pelvis up toward the ceiling and flatten your lower back into the firm surface. To help with this exercise, you may place a small towel under your lower back and try to push your back into the towel. Hold this position for __________ seconds. Let your muscles relax completely before you repeat this exercise. Repeat __________ times. Complete this exercise __________ times a day. Alternating arm and leg raises  Get on your hands and knees on a firm surface. If you are on a hard floor, you may want to use padding, such as an exercise mat, to cushion your knees. Line up your arms and legs. Your hands should be directly below your shoulders, and your knees should be directly below your hips. Lift your left leg behind you. At the same time, raise your right arm and straighten it in front of you. Do not lift your leg higher than your hip. Do not lift your arm higher than your shoulder. Keep your abdominal and back muscles tight. Keep your hips facing the ground. Do not arch your back. Keep your balance carefully, and do not hold your breath. Hold this position for __________ seconds. Slowly return to the starting position. Repeat with your right leg and your left arm. Repeat __________ times. Complete this exercise __________ times a day. Posture and body mechanics Good posture and healthy body mechanics can help to relieve stress in your body's tissues and joints. Body mechanics refers to the movements and positions of your body while you do your daily activities. Posture is part of body mechanics. Good posture means: Your spine is in its natural S-curve position (neutral). Your shoulders are pulled back slightly. Your head is not tipped forward. Follow these guidelines to improve your posture  and body mechanics in your everyday activities. Standing  When standing, keep your spine neutral and your feet about hip width apart. Keep a slight bend in your knees. Your ears, shoulders, and hips should line up. When you do a task in which you stand in one place for a long time, place one foot up on a stable object that is 2-4 inches (5-10 cm) high, such as a footstool. This helps keep your spine neutral. Sitting  When sitting, keep your spine neutral and keep your feet flat on the floor. Use a footrest, if necessary, and keep your thighs parallel to the floor. Avoid rounding your shoulders, and avoid tilting your head forward. When working at a desk or a computer, keep your desk at a height where your hands are slightly lower than your elbows. Slide your chair under your desk so you are close enough to maintain good posture. When working at a computer, place your monitor at a height where you are looking straight ahead and you do not have to tilt  your head forward or downward to look at the screen. Resting  When lying down and resting, avoid positions that are most painful for you. If you have pain with activities such as sitting, bending, stooping, or squatting, lie in a position in which your body does not bend very much. For example, avoid curling up on your side with your arms and knees near your chest (fetal position). If you have pain with activities such as standing for a long time or reaching with your arms, lie with your spine in a neutral position and bend your knees slightly. Try the following positions: Lying on your side with a pillow between your knees. Lying on your back with a pillow under your knees. Lifting  When lifting objects, keep your feet at least shoulder width apart and tighten your abdominal muscles. Bend your knees and hips and keep your spine neutral. It is important to lift using the strength of your legs, not your back. Do not lock your knees straight  out. Always ask for help to lift heavy or awkward objects. This information is not intended to replace advice given to you by your health care provider. Make sure you discuss any questions you have with your health care provider. Document Revised: 06/14/2021 Document Reviewed: 06/14/2021 Elsevier Patient Education  2023 [employer].

## 2022-07-05 ENCOUNTER — Ambulatory Visit (INDEPENDENT_AMBULATORY_CARE_PROVIDER_SITE_OTHER): Payer: Medicaid Other | Admitting: Family Medicine

## 2022-07-05 ENCOUNTER — Other Ambulatory Visit: Payer: Self-pay

## 2022-07-05 VITALS — BP 118/79 | HR 71 | Ht 62.0 in | Wt 170.4 lb

## 2022-07-05 DIAGNOSIS — F4323 Adjustment disorder with mixed anxiety and depressed mood: Secondary | ICD-10-CM | POA: Diagnosis present

## 2022-07-05 DIAGNOSIS — K0889 Other specified disorders of teeth and supporting structures: Secondary | ICD-10-CM | POA: Diagnosis not present

## 2022-07-05 MED ORDER — CHLORHEXIDINE GLUCONATE 0.12 % MT SOLN
OROMUCOSAL | 0 refills | Status: DC
Start: 2022-07-05 — End: 2022-08-23

## 2022-07-05 MED ORDER — IBUPROFEN 600 MG PO TABS
600.0000 mg | ORAL_TABLET | Freq: Three times a day (TID) | ORAL | 0 refills | Status: DC | PRN
Start: 2022-07-05 — End: 2023-01-01

## 2022-07-06 ENCOUNTER — Other Ambulatory Visit: Payer: Medicaid Other

## 2022-07-17 ENCOUNTER — Other Ambulatory Visit: Payer: Self-pay | Admitting: Family Medicine

## 2022-07-17 DIAGNOSIS — F4323 Adjustment disorder with mixed anxiety and depressed mood: Secondary | ICD-10-CM

## 2022-08-09 ENCOUNTER — Other Ambulatory Visit: Payer: Self-pay

## 2022-08-09 ENCOUNTER — Ambulatory Visit (INDEPENDENT_AMBULATORY_CARE_PROVIDER_SITE_OTHER): Payer: Medicaid Other | Admitting: Family Medicine

## 2022-08-09 ENCOUNTER — Encounter: Payer: Self-pay | Admitting: Family Medicine

## 2022-08-09 VITALS — BP 114/80 | HR 80 | Ht 62.0 in | Wt 177.8 lb

## 2022-08-09 DIAGNOSIS — R7303 Prediabetes: Secondary | ICD-10-CM | POA: Diagnosis not present

## 2022-08-09 DIAGNOSIS — R09A2 Foreign body sensation, throat: Secondary | ICD-10-CM | POA: Diagnosis present

## 2022-08-09 DIAGNOSIS — E782 Mixed hyperlipidemia: Secondary | ICD-10-CM | POA: Diagnosis not present

## 2022-08-09 LAB — POCT GLYCOSYLATED HEMOGLOBIN (HGB A1C): HbA1c, POC (prediabetic range): 6.1 % (ref 5.7–6.4)

## 2022-08-09 MED ORDER — FLUTICASONE PROPIONATE 50 MCG/ACT NA SUSP: 2.0000 | Freq: Every day | NASAL | 6 refills | Status: DC

## 2022-08-09 NOTE — Progress Notes (Signed)
    SUBJECTIVE:   CHIEF COMPLAINT / HPI:   Declined interpreter.  Paula Stark is a 32 y.o. female who presents to the [facility] clinic today to discuss the following concerns:   Prediabetes  Patient presents today for A1c check.  We are unable to draw this at her last visit due to lab closure.  She has never had a diagnosis of diabetes.  She currently takes metformin 500 mg daily.  Throat Pain Feels a sharp pain when she swallows. Only on the left-side. Denies any dysphagia. This sensation started after she received multiple dental fillings. She is concerned that a filling may be stuck in her throat. She has that sensation. She has been trying to drink plenty of water. Not taking anymore Ibuprofen or anything for the discomfort. She feels the sensation daily.   Never smoker. Has not had any fevers. Mostly with hot/warm foods. No dry mouth.  Not having any difficulty breathing.   PERTINENT  PMH / PSH: Anxiety   OBJECTIVE:   BP 114/80   Pulse 80   Ht 5\' 2"  (1.575 m)   Wt 177 lb 12.8 oz (80.6 kg)   SpO2 100%   BMI 32.52 kg/m    General: NAD, pleasant, able to participate in exam HEENT: Normocephalic, fair dentition with fillings, no abnormalities to oropharynx, no ttp of submandibular glands Neck: Supple, no thyroid abnormalities, no nodules, no LAD  Respiratory: normal effort Psych: Normal affect and mood  ASSESSMENT/PLAN:   1. Prediabetes Stable, A1c 6.1.  - HgB A1c  2. Mixed hyperlipidemia Not on statin, requests recheck of lipids today. She is fasting.  - Lipid Panel  3. Globus sensation No examination abnormalities. Reassured patient that this is unlikely to be anything serious or life-threatening. Given that she is not having any difficulties eating/drinking and reassuring examination that imaging is likely not going to be helpful. She has significant trauma from dental extraction and I am wondering if she would benefit from CBT for this. She is not interested at this time.  She was amenable to trial of flonase. Will monitor with time.     Sabino Dick, DO [facility]

## 2022-08-09 NOTE — Patient Instructions (Addendum)
It was wonderful to see you today.  Please bring ALL of your medications with you to every visit.   Today we talked about:  Please let us know if you have any difficulty drinking or eating or the sensation gets worse.  For now, use the Flonase daily.  We can also consider Cognitive Behavioral Therapy.   We are doing labs to check your cholesterol and A1c today.   Thank you for coming to your visit as scheduled. We have had a large "no-show" problem lately, and this significantly limits our ability to see and care for patients. As a friendly reminder- if you cannot make your appointment please call to cancel. We do have a no show policy for those who do not cancel within 24 hours. Our policy is that if you miss or fail to cancel an appointment within 24 hours, 3 times in a 69-month period, you may be dismissed from our clinic.   Thank you for choosing [facility].   Please call [phone] with any questions about today's appointment.  Please be sure to schedule follow up at the front  desk before you leave today.   Sabino Dick, DO PGY-3 Family Medicine

## 2022-08-10 ENCOUNTER — Encounter: Payer: Self-pay | Admitting: Family Medicine

## 2022-08-10 LAB — LIPID PANEL
Chol/HDL Ratio: 6.3 ratio — ABNORMAL HIGH (ref 0.0–4.4)
Cholesterol, Total: 209 mg/dL — ABNORMAL HIGH (ref 100–199)
HDL: 33 mg/dL — ABNORMAL LOW (ref >39)
LDL Chol Calc (NIH): 153 mg/dL — ABNORMAL HIGH (ref 0–99)
Triglycerides: 128 mg/dL (ref 0–149)
VLDL Cholesterol Cal: 23 mg/dL (ref 5–40)

## 2022-08-23 ENCOUNTER — Other Ambulatory Visit: Payer: Self-pay | Admitting: Family Medicine

## 2022-08-23 DIAGNOSIS — K0889 Other specified disorders of teeth and supporting structures: Secondary | ICD-10-CM

## 2022-10-10 ENCOUNTER — Other Ambulatory Visit: Payer: Self-pay

## 2022-10-10 DIAGNOSIS — K0889 Other specified disorders of teeth and supporting structures: Secondary | ICD-10-CM

## 2022-10-10 MED ORDER — CHLORHEXIDINE GLUCONATE 0.12 % MT SOLN
OROMUCOSAL | 0 refills | Status: DC
Start: 2022-10-10 — End: 2023-07-05

## 2022-11-10 ENCOUNTER — Other Ambulatory Visit: Payer: Self-pay | Admitting: Family Medicine

## 2022-11-10 DIAGNOSIS — K0889 Other specified disorders of teeth and supporting structures: Secondary | ICD-10-CM

## 2023-01-01 ENCOUNTER — Encounter (HOSPITAL_COMMUNITY): Payer: Self-pay

## 2023-01-01 ENCOUNTER — Ambulatory Visit (HOSPITAL_COMMUNITY)
Admission: EM | Admit: 2023-01-01 | Discharge: 2023-01-01 | Disposition: A | Discharge status: Home / Self Care | Payer: Medicaid Other | Attending: Family Medicine | Admitting: Family Medicine

## 2023-01-01 DIAGNOSIS — K0889 Other specified disorders of teeth and supporting structures: Secondary | ICD-10-CM

## 2023-01-01 MED ORDER — AMOXICILLIN 875 MG PO TABS: 875.0000 mg | ORAL_TABLET | Freq: Two times a day (BID) | ORAL | 0 refills | Status: AC

## 2023-01-01 NOTE — ED Triage Notes (Signed)
Patient here today with c/o left side dental pain since February. Patient states that this started after having fillings done. She says it feels like something is stuck in her jaw.

## 2023-01-01 NOTE — Discharge Instructions (Signed)
Please take your antibiotic.  Please follow-up with your PCP and your dentist.

## 2023-01-01 NOTE — ED Provider Notes (Signed)
[facility]    CSN: [id number] Arrival date & time: 01/01/23  1103      History   Chief Complaint Chief Complaint  Patient presents with   Dental Pain    HPI Paula Stark is a 32 y.o. female.   Patient with presenting with sensation of some pain in the upper palate as well as a globus sensation.  Patient states that the symptoms have been going on for quite a while and that she is noticing them get slightly worse.  Patient states that she had fillings done in January but has not seen her dentist since then.  Patient denies any fevers or chills or no other concerns.  Patient saw her primary care doctor regarding this in May of this year who thought that it was likely related to trauma and patient would benefit from CBT therapy as there is no acute concern for anything.  Patient has no other concerns at this time   Dental Pain   Past Medical History:  Diagnosis Date   Eczema    Gestational diabetes    Hemorrhoid    Postpartum hemorrhage     Patient Active Problem List   Diagnosis Date Noted   Rib pain on right side 01/28/2021   Prediabetes 10/08/2020   Nexplanon insertion 06/04/2020   COVID-19 affecting pregnancy, antepartum 03/24/2020   History of gestational diabetes mellitus (GDM) 02/18/2020   Hemorrhoids 02/03/2020   Supervision of high-risk pregnancy 10/30/2019    Past Surgical History:  Procedure Laterality Date   DILATION AND EVACUATION N/A 04/20/2015   Procedure: DILATATION AND EVACUATION;  Surgeon: Adam Phenix, MD;  Location: [facility];  Service: Gynecology;  Laterality: N/A;    OB History     Gravida  5   Para  4   Term  4   Preterm  0   AB  1   Living  4      SAB  1   IAB  0   Ectopic  0   Multiple  0   Live Births  4            Home Medications    Prior to Admission medications   Medication Sig Start Date End Date Taking? Authorizing Provider  amoxicillin (AMOXIL) 875 MG tablet Take 1 tablet (875 mg total) by mouth  2 (two) times daily for 7 days. 01/01/23 01/08/23 Yes Brenton Grills, MD  acetaminophen (TYLENOL) 500 MG tablet Take 2 tablets (1,000 mg total) by mouth every 6 (six) hours as needed (for pain scale < 4). Patient not taking: Reported on 07/05/2022 06/04/20   Venora Maples, MD  chlorhexidine (PERIDEX) 0.12 % solution RINSE AND GARGLE 15 ML BY MOUTH OR THROAT TWICE DAILY AS DIRECTED 10/10/22   Elberta Fortis, MD  etonogestrel (NEXPLANON) 68 MG IMPL implant Inject into the skin.    [provider]  fluticasone (FLONASE) 50 MCG/ACT nasal spray Place 2 sprays into both nostrils daily. 08/09/22   Sabino Dick, DO  hydrOXYzine (ATARAX) 25 MG tablet Take 1 tablet (25 mg total) by mouth 3 (three) times daily as needed for anxiety. 05/16/22   Sabino Dick, DO  magic mouthwash (nystatin, lidocaine, diphenhydrAMINE, alum & mag hydroxide) suspension Swish and spit 5 mLs 3 (three) times daily as needed for mouth pain. Patient not taking: Reported on 07/05/2022 07/27/21   Sabino Dick, DO  metFORMIN (GLUCOPHAGE-XR) 500 MG 24 hr tablet Take 1 tablet (500 mg total) by mouth daily with breakfast. 07/27/21  Sabino Dick, DO  sertraline (ZOLOFT) 100 MG tablet TAKE 1 TABLET(100 MG) BY MOUTH DAILY 07/17/22   Sabino Dick, DO    Family History Family History  Problem Relation Age of Onset   Diabetes Neg Hx    Hypertension Neg Hx    Obesity Neg Hx     Social History Social History   Tobacco Use   Smoking status: Never   Smokeless tobacco: Never  Vaping Use   Vaping status: Never Used  Substance Use Topics   Alcohol use: No   Drug use: No     Allergies   Patient has no known allergies.   Review of Systems Review of Systems   Physical Exam Triage Vital Signs ED Triage Vitals  Encounter Vitals Group     BP 01/01/23 1134 (!) 132/91     Systolic BP Percentile --      Diastolic BP Percentile --      Pulse Rate 01/01/23 1134 79     Resp 01/01/23 1134 16      Temp 01/01/23 1134 98.8 F (37.1 C)     Temp Source 01/01/23 1134 Oral     SpO2 01/01/23 1134 98 %     Weight 01/01/23 1134 182 lb (82.6 kg)     Height 01/01/23 1134 5\' 2"  (1.575 m)     Head Circumference --      Peak Flow --      Pain Score 01/01/23 1133 4     Pain Loc --      Pain Education --      Exclude from Growth Chart --    No data found.  Updated Vital Signs BP (!) 132/91 (BP Location: Left Arm)   Pulse 79   Temp 98.8 F (37.1 C) (Oral)   Resp 16   Ht 5\' 2"  (1.575 m)   Wt 82.6 kg   LMP  (LMP Unknown)   SpO2 98%   Breastfeeding No   BMI 33.29 kg/m   Visual Acuity Right Eye Distance:   Left Eye Distance:   Bilateral Distance:    Right Eye Near:   Left Eye Near:    Bilateral Near:     Physical Exam Constitutional:      Appearance: Normal appearance.  HENT:     Head:     Comments: There is some notable swelling of the left upper teeth throughout, mild tenderness to palpation of the upper palate as well as the left upper to throat.  No signs of abscess or drainage Cardiovascular:     Rate and Rhythm: Normal rate and regular rhythm.  Neurological:     Mental Status: She is alert.      UC Treatments / Results  Labs (all labs ordered are listed, but only abnormal results are displayed) Labs Reviewed - No data to display  EKG   Radiology No results found.  Procedures Procedures (including critical care time)  Medications Ordered in UC Medications - No data to display  Initial Impression / Assessment and Plan / UC Course  I have reviewed the triage vital signs and the nursing notes.  Pertinent labs & imaging results that were available during my care of the patient were reviewed by me and considered in my medical decision making (see chart for details).     Patient symptoms are nonspecific however they have been going on for quite some time, given some of the pain as well as swelling will do a trial of amoxicillin at this  time.  Patient was  advised that she needs to follow-up with her dentist regarding this pain and to see if there is any issue with the actual hardware.  If there is no issue with the hardware, patient was advised to follow-up with her PCP for possible swallow eval or to be seen by other specialist.  Patient is understanding and agreeable with plan. Final Clinical Impressions(s) / UC Diagnoses   Final diagnoses:  Pain, dental   Discharge Instructions   None    ED Prescriptions     Medication Sig Dispense Auth. Provider   amoxicillin (AMOXIL) 875 MG tablet Take 1 tablet (875 mg total) by mouth 2 (two) times daily for 7 days. 14 tablet Brenton Grills, MD      PDMP not reviewed this encounter.   Brenton Grills, MD 01/01/23 [phone]

## 2023-03-15 ENCOUNTER — Ambulatory Visit (INDEPENDENT_AMBULATORY_CARE_PROVIDER_SITE_OTHER): Payer: Medicaid Other | Admitting: Student

## 2023-03-15 VITALS — BP 125/84 | HR 70 | Ht 62.0 in | Wt 183.8 lb

## 2023-03-15 DIAGNOSIS — R070 Pain in throat: Secondary | ICD-10-CM | POA: Insufficient documentation

## 2023-03-15 MED ORDER — IBUPROFEN 600 MG PO TABS: 600.0000 mg | ORAL_TABLET | Freq: Three times a day (TID) | ORAL | 0 refills | Status: DC | PRN

## 2023-03-15 NOTE — Assessment & Plan Note (Signed)
Intermittent throbbing pain is in the left submandibular/left neck area and accompanied by globus sensation.  No concerning findings on physical exam.  Reassuringly she has had no fevers or weight loss.  Low concern for malignancy.  As this has been going on for almost a year I do think imaging is warranted to evaluate for any structural abnormalities that could be contributing to her symptoms.  -soft tissue neck MRI  -Follow-up with dentist as planned -Tylenol 1000 mg every 6 hours as needed.  If this does not improve pain, provided Rx ibuprofen 600 mg every 8 hours.

## 2023-03-15 NOTE — Progress Notes (Signed)
    SUBJECTIVE:   CHIEF COMPLAINT / HPI:   Neck discomfort  Pt reports discomfort in L submandibular/neck area. Feels like when she trys to swallow, it feels different on the L side and feels swollen when she touches the area. Sometimes there is a throbbing pain, ibuprofen helps. No difficultly swallow, Denies fevers.  Was seen at [facility] in May 2024 with plant o monitor. Seen at [facility] for the same in Oct 2024 and given a trial of amoxicillin and advised to see her dentist and PCP. She has had these symptoms since January 2024 after having dental work.   PERTINENT  PMH / PSH: T2DM, obesity, anxiety  OBJECTIVE:   BP 125/84   Pulse 70   Ht 5\' 2"  (1.575 m)   Wt 183 lb 12.8 oz (83.4 kg)   SpO2 100%   BMI 33.62 kg/m    General: NAD, pleasant HEENT: Pink and MMM without any swelling or pain in mouth including under tongue.  No palpable submandibular or cervical lymphadenopathy, no pain with palpation of submandibular or anterior neck bilaterally Cardiac: Well-perfused Respiratory: Breathing comfortably on room air Neuro: alert, no obvious focal deficits Psych: Normal affect and mood  ASSESSMENT/PLAN:   Throat pain in adult Intermittent throbbing pain is in the left submandibular/left neck area and accompanied by globus sensation.  No concerning findings on physical exam.  Reassuringly she has had no fevers or weight loss.  Low concern for malignancy.  As this has been going on for almost a year I do think imaging is warranted to evaluate for any structural abnormalities that could be contributing to her symptoms.  -soft tissue neck MRI  -Follow-up with dentist as planned -Tylenol 1000 mg every 6 hours as needed.  If this does not improve pain, provided Rx ibuprofen 600 mg every 8 hours.   PCP visit scheduled for 03/28/23 to discuss diabetes and hyperlipidemia.  Would recommend getting BMP, UACR, and HgA1c   Dr. Erick Alley, DO [facility]

## 2023-03-15 NOTE — Patient Instructions (Addendum)
It was great to see you! Thank you for allowing me to participate in your care!  I recommend that you always bring your medications to each appointment as this makes it easy to ensure you are on the correct medications and helps [place] not miss when refills are needed.  Our plans for today:  - Go get the MRI done to evaluate your neck pain - I recommend trying tylenol 1000 mg every 6 hours as needed for your neck pain. If that does not work, you can take Ibuprofen I prescribed today - Return in January to discuss diabetes and cholesterol    Take care and seek immediate care sooner if you develop any concerns.   Dr. Erick Alley, DO [facility]

## 2023-03-22 ENCOUNTER — Ambulatory Visit (HOSPITAL_COMMUNITY)
Admission: RE | Admit: 2023-03-22 | Discharge: 2023-03-22 | Disposition: A | Discharge status: Home / Self Care | Payer: Medicaid Other | Source: Ambulatory Visit | Attending: Family Medicine | Admitting: Family Medicine

## 2023-03-22 DIAGNOSIS — R070 Pain in throat: Secondary | ICD-10-CM | POA: Diagnosis present

## 2023-03-22 DIAGNOSIS — J351 Hypertrophy of tonsils: Secondary | ICD-10-CM | POA: Diagnosis not present

## 2023-03-26 NOTE — Progress Notes (Signed)
    SUBJECTIVE:   CHIEF COMPLAINT / HPI:   Prediabetes, HLD Presents for A1c, reports she has not taken her metformin  in multiple months.  Eats a diet heavy in rice and states she sometimes drinks soda/juice.  History of GDM in prior pregnancy.  Nexplanon  Reports concern her Nexplanon  site is itchy and sometimes painful.  Notes she sees multiple bumps under her skin and is concerned it may have broken.  Would like to get it removed and switch contraception options.  PERTINENT  PMH / PSH: Prediabetes, HLD  OBJECTIVE:   BP 125/88   Pulse 73   Ht 5' 2 (1.575 m)   Wt 184 lb 9.6 oz (83.7 kg)   SpO2 98%   BMI 33.76 kg/m    General: Alert, no apparent distress, well groomed HEENT: Normocephalic, atraumatic, moist mucus membranes, neck supple Respiratory: Normal respiratory effort GI: Non-distended Skin: No rashes, no jaundice Ext: Nexplanon  palpable in left inferior bicep region, 2 distinct pieces palpable. Psych: Appropriate mood and affect  ASSESSMENT/PLAN:   Assessment & Plan Diabetes mellitus, new onset (HCC) Previously prediabetic, A1c 6.6 today consistent with new onset diabetes diagnosis.  Discussed diagnosis and lifestyle changes extensively.  Given concommitment HLD and desire for weight loss, would benefit from GLP-1 therapy and lieu of restarting metformin .  Will defer statin at this time given age. Recommended yearly ophthalmologic exam and checking her feet daily. -Start Mounjaro  2.5 mg weekly x 1 month and then increase to 5 mg weekly -Glucometer and supplies sent to pharmacy, recommend checking fasting BGL and follow-up with log -A1c and lipid panel in 3 months -Consider referral to diabetes dietitian -Now qualifies for pneumonia vaccine, provide at follow-up Nexplanon  in place Nexplanon  broken into 2 pieces upon exam, causing patient discomfort therefore do recommend removal.  Patient to schedule follow-up for removal and would like to change contraceptive options  to Depo shot. Throat pain in adult MRI results pending read, follow-up when available.   Dr. Izetta Nap, DO [facility]

## 2023-03-28 ENCOUNTER — Ambulatory Visit: Payer: Self-pay | Admitting: Family Medicine

## 2023-03-28 ENCOUNTER — Encounter: Payer: Self-pay | Admitting: Family Medicine

## 2023-03-28 VITALS — BP 125/88 | HR 73 | Ht 62.0 in | Wt 184.6 lb

## 2023-03-28 DIAGNOSIS — R7303 Prediabetes: Secondary | ICD-10-CM | POA: Diagnosis not present

## 2023-03-28 DIAGNOSIS — Z975 Presence of (intrauterine) contraceptive device: Secondary | ICD-10-CM

## 2023-03-28 DIAGNOSIS — R070 Pain in throat: Secondary | ICD-10-CM | POA: Diagnosis not present

## 2023-03-28 DIAGNOSIS — E119 Type 2 diabetes mellitus without complications: Secondary | ICD-10-CM

## 2023-03-28 LAB — POCT GLYCOSYLATED HEMOGLOBIN (HGB A1C): HbA1c, POC (controlled diabetic range): 6.6 % (ref 0.0–7.0)

## 2023-03-28 MED ORDER — ACCU-CHEK SOFTCLIX LANCET DEV KIT: PACK | 0 refills | Status: DC

## 2023-03-28 MED ORDER — MOUNJARO 2.5 MG/0.5ML ~~LOC~~ SOAJ: SUBCUTANEOUS | 0 refills | Status: DC

## 2023-03-28 MED ORDER — ACCU-CHEK SOFTCLIX LANCETS MISC: 3 refills | Status: DC

## 2023-03-28 MED ORDER — ACCU-CHEK GUIDE W/DEVICE KIT: PACK | 0 refills | Status: AC

## 2023-03-28 NOTE — Assessment & Plan Note (Signed)
 Nexplanon broken into 2 pieces upon exam, causing patient discomfort therefore do recommend removal.  Patient to schedule follow-up for removal and would like to change contraceptive options to Depo shot.

## 2023-03-28 NOTE — Patient Instructions (Signed)
 It was wonderful to see you today! Thank you for choosing [facility].   Please bring ALL of your medications with you to every visit.   Today we talked about:  Your A1c is 6.6 today which indicates you now have diabetes.  I would like to start you on a medication called Mounjaro  that you will take 2.5 mg injected weekly for 1 month.  After that time we will increase to Mounjaro  5 mg weekly and continue the medication after that.  We will need to repeat your A1c in 3 months.  I will also check your cholesterol at that time as this medication can help lower cholesterol without needing another medication. Please keep in mind that you have diabetes I recommend yearly vision screenings and please check your feet every day for any cuts or sores. I also sent in a glucometer to your pharmacy.  Please check your fasting blood sugar at least 3-4 times per week with a goal sugar between 70 and 100.  If you are dropping below 70 consistently please let me know and return to the office. It does appear that your Nexplanon  is in 2 pieces, please schedule an appointment with us  to have it removed and we can discuss other birth control at that time.  It is likely you could receive Depo that day for birth control. Your MRI of your throat is still pending, when we receive those results we will follow-up with you.  Please follow up in 3 months for A1c and schedule back for removal of your Nexplanon   Call the clinic at [phone] if your symptoms worsen or you have any concerns.  Please be sure to schedule follow up at the front desk before you leave today.   Izetta Nap, DO Family Medicine

## 2023-03-28 NOTE — Assessment & Plan Note (Signed)
 MRI results pending read, follow-up when available.

## 2023-03-29 ENCOUNTER — Telehealth: Payer: Self-pay | Admitting: Family Medicine

## 2023-03-29 NOTE — Telephone Encounter (Signed)
 Patient stated she went to the pharmacy who said she needs pre-authorization to get [facility] 2.5  [phone]

## 2023-03-30 ENCOUNTER — Other Ambulatory Visit (HOSPITAL_COMMUNITY): Payer: Self-pay

## 2023-03-30 ENCOUNTER — Telehealth: Payer: Self-pay

## 2023-03-30 DIAGNOSIS — E119 Type 2 diabetes mellitus without complications: Secondary | ICD-10-CM

## 2023-03-30 MED ORDER — SEMAGLUTIDE(0.25 OR 0.5MG/DOS) 2 MG/1.5ML ~~LOC~~ SOPN: PEN_INJECTOR | SUBCUTANEOUS | 1 refills | Status: DC

## 2023-03-30 NOTE — Telephone Encounter (Signed)
 Rec'd phone call from patient regarding [facility] PA needed.   Informed patient medicaid doesn't cover this medication. They may cover ozempic if patient has failed metformin therapy. Let patient know I would get this info to her doctor.

## 2023-03-31 ENCOUNTER — Other Ambulatory Visit: Payer: Self-pay | Admitting: Family Medicine

## 2023-03-31 DIAGNOSIS — E119 Type 2 diabetes mellitus without complications: Secondary | ICD-10-CM

## 2023-04-02 NOTE — Telephone Encounter (Signed)
 Pharmacy Patient Advocate Encounter   Received notification from Phone that prior authorization for OZEMPIC  is required/requested.   The patient is insured through [facility] MEDICAID .   PA required; PA submitted to above mentioned insurance via CoverMyMeds Key/confirmation #/[employer]. Status is pending

## 2023-04-03 NOTE — Telephone Encounter (Signed)
 Pharmacy Patient Advocate Encounter  Received notification from [facility] that Prior Authorization for [facility] has been APPROVED from 04/02/23 to 04/01/24   PA #/Case ID/Reference #:  [id number]

## 2023-05-08 ENCOUNTER — Telehealth: Payer: Self-pay | Admitting: Family Medicine

## 2023-05-08 DIAGNOSIS — E119 Type 2 diabetes mellitus without complications: Secondary | ICD-10-CM

## 2023-05-08 MED ORDER — ACCU-CHEK SOFTCLIX LANCETS MISC: 3 refills | Status: AC

## 2023-05-08 NOTE — Telephone Encounter (Signed)
Refill of test strips sent to pharmacy.  Elberta Fortis, DO

## 2023-05-08 NOTE — Telephone Encounter (Signed)
Patient walked in to request refill of:  Name of Medication(s):  Test Strips for Accu Check Meter Last date of OV:  03/28/23 Pharmacy:  same  Will route refill request to Clinic RN.  Discussed with patient policy to call pharmacy for future refills.  Also, discussed refills may take up to 48 hours to approve or deny.  Paula Stark

## 2023-06-23 ENCOUNTER — Other Ambulatory Visit: Payer: Self-pay | Admitting: Family Medicine

## 2023-06-23 DIAGNOSIS — E119 Type 2 diabetes mellitus without complications: Secondary | ICD-10-CM

## 2023-07-04 NOTE — Progress Notes (Signed)
    SUBJECTIVE:   CHIEF COMPLAINT / HPI:   Diabetes Current Regimen: Ozempic  5 mg weekly CBGs: Not checking, unable to get strips from the pharmacy Last A1c:  Lab Results  Component Value Date   HGBA1C 5.6 07/05/2023    Denies polyuria, polydipsia, hypoglycemia Last Eye Exam: DUE Statin: Lipid panel today ACE/ARB: ACR today   R tooth pain  x months.  Previously seen by a dentist but had a traumatic experience and has not returned.  Had multiple cavity fills at that time.  Has been using the chlorhexidine  mouthwash but ran out to help with her gingivitis.  GAD Requesting refill of hydroxyzine .  States she only needs to use 1-2 times at night per week to help her sleep.  Denies daily use.  Reports she has difficulty turning off her mind at night.  Discussed sleep hygiene.   PERTINENT  PMH / PSH: T2DM, GAD  OBJECTIVE:   BP 114/88   Pulse 87   Ht 5\' 2"  (1.575 m)   Wt 171 lb 8 oz (77.8 kg)   SpO2 100%   BMI 31.37 kg/m    General: NAD, pleasant, able to participate in exam HEENT: Dental erosion with superficial plaque formation and gingivitis noted throughout oral cavity.  No sign of dental abscess or tooth with surrounding erythema or edema. Cardiac: RRR, no murmurs. Respiratory: CTAB, normal effort, No wheezes, rales or rhonchi Extremities: no edema or cyanosis. Skin: warm and dry, no rashes noted Neuro: alert, no obvious focal deficits Psych: Normal affect and mood  ASSESSMENT/PLAN:   Assessment & Plan Type 2 diabetes mellitus without complication, without long-term current use of insulin (HCC) A1c 5.6, down from 6.6 in 03/2023.  Additionally down 13 pounds in the same timeframe.  Unable to check CBGs should not get test strips with a glucometer but given well-controlled A1c recommended checking fasting sugar few times per week. -Refill Ozempic  5 mg weekly -Sent in glucometer test strips -BMP, lipid panel, ACR -Follow-up in 6 months, continues to do well could  discuss medication adjustment as patient ultimately would like to not have to take medication long-term Situational mixed anxiety and depressive disorder Only using hydroxyzine  1-2 nights per week, discussed if it becomes daily use to return to care to discuss daily maintenance therapy. -Discussed sleep hygiene -Refill hydroxyzine  25 mg Tooth pain Refill chlorhexidine  mouth but ultimately needs to see a dentist, provided with list that accepts insurance. Nexplanon  in place Expired and desires removal, scheduled with OB/GYN in the next month for procedure.  Return if needed that have it done at our clinic.   Dr. Jonne Netters, DO [facility]

## 2023-07-05 ENCOUNTER — Other Ambulatory Visit: Payer: Self-pay

## 2023-07-05 ENCOUNTER — Encounter: Payer: Self-pay | Admitting: Family Medicine

## 2023-07-05 ENCOUNTER — Ambulatory Visit (INDEPENDENT_AMBULATORY_CARE_PROVIDER_SITE_OTHER): Payer: Medicaid Other | Admitting: Family Medicine

## 2023-07-05 VITALS — BP 114/88 | HR 87 | Ht 62.0 in | Wt 171.5 lb

## 2023-07-05 DIAGNOSIS — Z975 Presence of (intrauterine) contraceptive device: Secondary | ICD-10-CM | POA: Diagnosis not present

## 2023-07-05 DIAGNOSIS — E119 Type 2 diabetes mellitus without complications: Secondary | ICD-10-CM | POA: Diagnosis not present

## 2023-07-05 DIAGNOSIS — K0889 Other specified disorders of teeth and supporting structures: Secondary | ICD-10-CM

## 2023-07-05 DIAGNOSIS — F4323 Adjustment disorder with mixed anxiety and depressed mood: Secondary | ICD-10-CM | POA: Diagnosis not present

## 2023-07-05 LAB — POCT GLYCOSYLATED HEMOGLOBIN (HGB A1C): Hemoglobin A1C: 5.6 % (ref 4.0–5.6)

## 2023-07-05 MED ORDER — HYDROXYZINE HCL 25 MG PO TABS
25.0000 mg | ORAL_TABLET | Freq: Three times a day (TID) | ORAL | 2 refills | Status: DC | PRN
Start: 2023-07-05 — End: 2023-11-22

## 2023-07-05 MED ORDER — CHLORHEXIDINE GLUCONATE 0.12 % MT SOLN: OROMUCOSAL | 0 refills | Status: DC

## 2023-07-05 MED ORDER — ACCU-CHEK GUIDE TEST VI STRP: ORAL_STRIP | 12 refills | Status: DC

## 2023-07-05 MED ORDER — OZEMPIC (0.25 OR 0.5 MG/DOSE) 2 MG/3ML ~~LOC~~ SOPN: 0.5000 mg | PEN_INJECTOR | SUBCUTANEOUS | 4 refills | Status: DC

## 2023-07-05 NOTE — Patient Instructions (Addendum)
 It was wonderful to see you today! Thank you for choosing [facility].   Please bring ALL of your medications with you to every visit.   Today we talked about:  Please continue on the Ozempic for the next 6 months and continue to work on controlling your portion sizes and reducing the amount of sugar in your diet.  Will recheck your A1c at that time and can consider stopping the Ozempic. I am checking some of the blood work to look at your cholesterol and kidney function, I will follow-up with those results. I am refilling the hydroxyzine to use as needed for anxiety.  If you find you are having to use the medication daily and please call to schedule an appointment with me as we can discuss daily maintenance medications for anxiety. I refilled the mouthwash for you, please use as prescribed.  I would recommend going to see a dentist as it does look like you have some plaque on your teeth that would best served by dental cleaning.  I am including a list of dentist below for you to consider.  Please follow up in 6 months  If you haven't already, sign up for My Chart to have easy access to your labs results, and communication with your primary care physician.   We are checking some labs today. If they are abnormal, I will call you. If they are normal, I will send you a MyChart message (if it is active) or a letter in the mail. If you do not hear about your labs in the next 2 weeks, please call the office.  Call the clinic at [phone] if your symptoms worsen or you have any concerns.  Please be sure to schedule follow up at the front desk before you leave today.   Paula Fortis, DO Family Medicine    [facility]  [place] DDS     [phone]  Milus Banister, DDS (Spanish speaking)  [address]  [place]  [address]  Se habla espaol  From 74 to 70 years old  Parent may go with child   [facility]  [phone]  [address]   [place] [address]  Se habla espanol  Interpretation for other languages  Special needs children welcome   [facility]   [phone]  Dr. Orlean Patten  [address]  [place], [place] [address]  Se habla espaol  From birth to 12 years  Special needs children welcome     Melynda Ripple DDS    [phone]  [address]  [place] [address]  Se habla espaol  From 18 months to 11 years old  Parent may go with child   Edward [facility]olly and [place] DDS  [address] [place]   [phone]  Need to bring interpreter   [facility]  [address]  [place] [address]   [phone]  Has interpreters in common languages    [facility]   [address]  [place], [place] [address]  Phone: [phone]   Health Department  (two locations)    [facility]: [facility] at [address] [place], [place], [place] [address].For appointments and more information, call [phone].   [facility]: [address] [place], [place] [address]. For appointments and more information, call [phone].

## 2023-07-06 ENCOUNTER — Telehealth: Payer: Self-pay | Admitting: Family Medicine

## 2023-07-06 LAB — BASIC METABOLIC PANEL WITH GFR
BUN/Creatinine Ratio: 15 (ref 9–23)
BUN: 9 mg/dL (ref 6–20)
CO2: 20 mmol/L (ref 20–29)
Calcium: 10.1 mg/dL (ref 8.7–10.2)
Chloride: 104 mmol/L (ref 96–106)
Creatinine, Ser: 0.6 mg/dL (ref 0.57–1.00)
Glucose: 93 mg/dL (ref 70–99)
Potassium: 4.2 mmol/L (ref 3.5–5.2)
Sodium: 140 mmol/L (ref 134–144)
eGFR: 120 mL/min/{1.73_m2} (ref >59)

## 2023-07-06 LAB — MICROALBUMIN / CREATININE URINE RATIO
Creatinine, Urine: 172.3 mg/dL
Microalb/Creat Ratio: 6 mg/g{creat} (ref 0–29)
Microalbumin, Urine: 9.5 ug/mL

## 2023-07-06 LAB — LIPID PANEL
Chol/HDL Ratio: 6.8 ratio — ABNORMAL HIGH (ref 0.0–4.4)
Cholesterol, Total: 210 mg/dL — ABNORMAL HIGH (ref 100–199)
HDL: 31 mg/dL — ABNORMAL LOW (ref >39)
LDL Chol Calc (NIH): 161 mg/dL — ABNORMAL HIGH (ref 0–99)
Triglycerides: 99 mg/dL (ref 0–149)
VLDL Cholesterol Cal: 18 mg/dL (ref 5–40)

## 2023-07-06 NOTE — Telephone Encounter (Signed)
 Attempted to call patient regarding lab results, left voicemail.  Given persistently elevated LDL >160, patient meets criteria for statin therapy per [facility] guidelines.  Currently has Nexplanon  in place but planning for removal, would need certain form of birth control given fetal toxicity risk of statins.  Advised patient to return call to the office to discuss statin therapy further.  Jonne Netters, DO

## 2023-07-06 NOTE — Assessment & Plan Note (Signed)
 A1c 5.6, down from 6.6 in 03/2023.  Additionally down 13 pounds in the same timeframe.  Unable to check CBGs should not get test strips with a glucometer but given well-controlled A1c recommended checking fasting sugar few times per week. -Refill Ozempic  5 mg weekly -Sent in glucometer test strips -BMP, lipid panel, ACR -Follow-up in 6 months, continues to do well could discuss medication adjustment as patient ultimately would like to not have to take medication long-term

## 2023-07-08 ENCOUNTER — Encounter: Payer: Self-pay | Admitting: Family Medicine

## 2023-07-13 ENCOUNTER — Telehealth: Payer: Self-pay | Admitting: Family Medicine

## 2023-07-13 DIAGNOSIS — E782 Mixed hyperlipidemia: Secondary | ICD-10-CM

## 2023-07-13 MED ORDER — ROSUVASTATIN CALCIUM 10 MG PO TABS: 10.0000 mg | ORAL_TABLET | Freq: Every day | ORAL | 3 refills | Status: AC

## 2023-07-13 NOTE — Telephone Encounter (Signed)
 Spoke with patient about lipid panel results.  Persistently elevated LDL > 160 for the past few years despite diet and exercise.  Patient also reports strong family history of high cholesterol and her father recently had a stroke.  Using Nexplanon  for birth control, planning to have it removed next week and start on Depo for contraception.  Does not desire any more children, extensively discussed fetal toxicity risk of statin.  Patient agreeable to start medication, will initiate rosuvastatin 10 mg daily.  Recheck lipid panel in 6 months with A1c to determine need for statin titration.  Jonne Netters, DO

## 2023-07-13 NOTE — Telephone Encounter (Signed)
-----   Message from Paula Stark sent at 07/06/2023  2:52 PM EDT ----- Call patient to initiate statin

## 2023-07-19 ENCOUNTER — Other Ambulatory Visit: Payer: Self-pay

## 2023-07-19 ENCOUNTER — Encounter: Payer: Self-pay | Admitting: Obstetrics and Gynecology

## 2023-07-19 ENCOUNTER — Ambulatory Visit: Admitting: Obstetrics and Gynecology

## 2023-07-19 VITALS — BP 113/78 | HR 80 | Wt 169.1 lb

## 2023-07-19 DIAGNOSIS — Z3042 Encounter for surveillance of injectable contraceptive: Secondary | ICD-10-CM | POA: Diagnosis not present

## 2023-07-19 DIAGNOSIS — T85698A Other mechanical complication of other specified internal prosthetic devices, implants and grafts, initial encounter: Secondary | ICD-10-CM

## 2023-07-19 DIAGNOSIS — Z758 Other problems related to medical facilities and other health care: Secondary | ICD-10-CM

## 2023-07-19 DIAGNOSIS — Z3046 Encounter for surveillance of implantable subdermal contraceptive: Secondary | ICD-10-CM | POA: Diagnosis not present

## 2023-07-19 MED ORDER — MEDROXYPROGESTERONE ACETATE 150 MG/ML IM SUSY
150.0000 mg | PREFILLED_SYRINGE | Freq: Once | INTRAMUSCULAR | Status: AC
  Administered 2023-07-19: 150 mg via INTRAMUSCULAR

## 2023-07-19 NOTE — Progress Notes (Addendum)
 GYNECOLOGY VISIT  Patient name: Paula Stark MRN [id number]  Date of birth: 07/25/1990 Chief Complaint:   nexplanon  check    History:  Paula Stark is a 33 y.o. [id number] being seen today for contraception management.  Inserted 05/2020 PP. Has been having irregular periods. States has had nexplanon  before and it was very painful to have it removed, felt there was not enough anesthetic present. States it was itching a lot, had been scratching at insertion site and then about 1 year ago noticed that it was bent in the middle. She is concerend about the pain of removal and would prefer to keep device if it can be used for longer, otherwise she is ok with removal and starting depo instead.   Past Medical History:  Diagnosis Date   Eczema    Gestational diabetes    Hemorrhoid    Postpartum hemorrhage     Past Surgical History:  Procedure Laterality Date   DILATION AND EVACUATION N/A 04/20/2015   Procedure: DILATATION AND EVACUATION;  Surgeon: Tresia Fruit, MD;  Location: [facility];  Service: Gynecology;  Laterality: N/A;    The following portions of the patient's history were reviewed and updated as appropriate: allergies, current medications, past family history, past medical history, past social history, past surgical history and problem list.   Health Maintenance:   Last pap     Component Value Date/Time   DIAGPAP  07/14/2019 1005    - Negative for intraepithelial lesion or malignancy (NILM)   HPVHIGH Negative 07/14/2019 1005   ADEQPAP  07/14/2019 1005    Satisfactory for evaluation; transformation zone component PRESENT.    Last mammogram: n/a   Review of Systems:  Pertinent items are noted in HPI. Comprehensive review of systems was otherwise negative.   Objective:  Physical Exam BP 113/78   Pulse 80   Wt 169 lb 2 oz (76.7 kg)   LMP 07/09/2023 (Exact Date)   BMI 30.93 kg/m    Physical Exam Vitals and nursing note reviewed.  Constitutional:      Appearance: Normal  appearance.  HENT:     Head: Normocephalic and atraumatic.  Pulmonary:     Effort: Pulmonary effort is normal.  Musculoskeletal:     Comments: Device palpated in left upper arm, suspected to be broken  Skin:    General: Skin is warm and dry.  Neurological:     General: No focal deficit present.     Mental Status: She is alert.  Psychiatric:        Mood and Affect: Mood normal.        Behavior: Behavior normal.        Thought Content: Thought content normal.        Judgment: Judgment normal.    NEXPLANON  REMOVAL The risks (including infection, bleeding, pain, and uterine perforation) and benefits of the procedure were explained to the patient and Written informed consent was obtained.   Device was palpated in left upper arm. After time out, the skin was cleaned with alcohol and infiltrated with 3cc of 1% lidocaine  was used to infiltrate the skin and subcutaneous tissue deep to the device in the midline where the break was suspected to be located. The area was cleaned with betadine x3.  Using an 11 blade, the skin was incised and two pieces of nexplanon  were removed, appeared to be entire device.  The implant was shown to the patient.  The skin was cleaned, incision covered with Steri-Strips, and an adhesive  bandage.  Arm was wrapped and post procedure instructions provided to the patient.  Depo chose as new contraceptive method.   Labs and Imaging   Media Information  Document Information     Assessment & Plan:   1. Nexplanon  removal (Primary) 2. 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 Given non-intact device removal, recommend upper extremity xray to ensure complete removal.   - XR Humerus Left  3. Encounter for surveillance of injectable contraceptive Initiate depo injection today. Patient considering replacement of new nexplanon  vs continuation of depo.  - medroxyPROGESTERone   Acetate SUSY 150 mg  4. Language barrier Video interpreter used for encounter   Routine preventative health maintenance measures emphasized.  Kiki Pelton, MD [facility] for [facility], [facility]

## 2023-08-01 ENCOUNTER — Telehealth: Payer: Self-pay

## 2023-08-01 NOTE — Telephone Encounter (Signed)
 Patient calls nurse line requesting to schedule appointment for chest pain.   She reports that pain has been occurring intermittently for the last month.   Denies current chest pain, shortness of breath, difficulty breathing, nausea, vomiting, jaw pain or arm pain. Patient speaking in complete sentences.   Offered patient same day appointment for evaluation. Patient declines and states that she would rather wait until PCP next availability. Scheduled patient on Friday, 08/03/23.  ED precautions discussed.   Elsie Halo, RN

## 2023-08-03 ENCOUNTER — Encounter: Payer: Self-pay | Admitting: Family Medicine

## 2023-08-03 ENCOUNTER — Ambulatory Visit (INDEPENDENT_AMBULATORY_CARE_PROVIDER_SITE_OTHER): Admitting: Family Medicine

## 2023-08-03 VITALS — BP 114/89 | HR 80 | Ht 62.0 in | Wt 167.8 lb

## 2023-08-03 DIAGNOSIS — R079 Chest pain, unspecified: Secondary | ICD-10-CM | POA: Diagnosis present

## 2023-08-03 DIAGNOSIS — K219 Gastro-esophageal reflux disease without esophagitis: Secondary | ICD-10-CM | POA: Diagnosis not present

## 2023-08-03 MED ORDER — FAMOTIDINE 20 MG PO TABS: 20.0000 mg | ORAL_TABLET | Freq: Every day | ORAL | 1 refills | Status: DC

## 2023-08-03 NOTE — Patient Instructions (Addendum)
 It was wonderful to see you today! Thank you for choosing [facility].   Please bring ALL of your medications with you to every visit.   Today we talked about:  I think your chest pain may be secondary to reflux that could be coming from the Ozempic .  We will get your heart rhythm today to make sure everything looks normal.   I would also like to get the testing for the infection H. pylori to make sure that looks good as well.  Please do not eat or drink anything prior to testing.  I am giving you the medication famotidine that you can take for symptom relief as well in the meantime.  Please follow up in 1 month  If you haven't already, sign up for My Chart to have easy access to your labs results, and communication with your primary care physician.  Call the clinic at [phone] if your symptoms worsen or you have any concerns.  Please be sure to schedule follow up at the front desk before you leave today.   Jonne Netters, DO Family Medicine

## 2023-08-03 NOTE — Progress Notes (Addendum)
    SUBJECTIVE:   CHIEF COMPLAINT / HPI:   Chest pain X 6 weeks. Starts in her central chest and radiates to her back, occurring 2-3 times per week.  Not really worse with movement.  Denies N/V/D.  Does endorse some symptoms of reflux with burning in her throat.  Sometimes feel like it is worse with breathing and hard to take a deep breath.  Denies wheezing.  Denies daytime fatigue.  Symptomatically worse in the morning, not at night.  Not currently experiencing chest pain.  Has taken hydroxyzine  for anxiety, did not seem to help.  PERTINENT  PMH / PSH: T2DM  OBJECTIVE:   BP 114/89   Pulse 80   Ht 5\' 2"  (1.575 m)   Wt 167 lb 12.8 oz (76.1 kg)   LMP 07/09/2023 (Exact Date)   SpO2 100%   BMI 30.69 kg/m    General: NAD, pleasant, able to participate in exam Cardiac: RRR, no murmurs. Respiratory: CTAB, normal effort, No wheezes, rales or rhonchi Abdomen: Bowel sounds present, nontender, nondistended Extremities: no edema or cyanosis. Skin: warm and dry, no rashes noted MSK: Nontender to palpation over sternum and left central ribs. Neuro: alert, no obvious focal deficits Psych: Normal affect and mood  ASSESSMENT/PLAN:   Assessment & Plan Chest pain, unspecified type Asymptomatic today.  Most likely related to reflux as below.  Symptoms do not appear to be associated with anxiety and not reproducible to palpation to suggest MSK origin.  EKG with inconsistent T wave abnormality in lead II but otherwise normal, low concern for cardiac etiology.  Will treat symptomatically for reflux and assess for improvement.  Discussed cardiology referral with patient, opted to wait at this time and assess for symptom improvement with medication. Gastroesophageal reflux disease, unspecified whether esophagitis present Possibly worsened by Ozempic  5 mg weekly dosing but given persistent symptoms will obtain H. pylori testing.  If negative, would consider decreasing Ozempic  dosing.  -F/u H. pylori  testing, scheduled for lab visit 5/19 -Famotidine 20 mg daily   Dr. Jonne Netters, DO [facility]

## 2023-08-06 ENCOUNTER — Other Ambulatory Visit: Payer: Self-pay

## 2023-08-06 DIAGNOSIS — K219 Gastro-esophageal reflux disease without esophagitis: Secondary | ICD-10-CM

## 2023-08-08 LAB — H. PYLORI BREATH TEST: H pylori Breath Test: POSITIVE — AB

## 2023-08-09 ENCOUNTER — Ambulatory Visit: Payer: Self-pay | Admitting: Family Medicine

## 2023-08-09 DIAGNOSIS — A048 Other specified bacterial intestinal infections: Secondary | ICD-10-CM

## 2023-08-09 MED ORDER — OMEPRAZOLE 20 MG PO CPDR: 20.0000 mg | DELAYED_RELEASE_CAPSULE | Freq: Two times a day (BID) | ORAL | 0 refills | Status: DC

## 2023-08-09 MED ORDER — BISMUTH/METRONIDAZ/TETRACYCLIN 140-125-125 MG PO CAPS: 3.0000 | ORAL_CAPSULE | Freq: Four times a day (QID) | ORAL | 0 refills | Status: DC

## 2023-08-13 ENCOUNTER — Other Ambulatory Visit: Payer: Self-pay

## 2023-08-13 ENCOUNTER — Emergency Department (HOSPITAL_COMMUNITY)

## 2023-08-13 ENCOUNTER — Emergency Department (HOSPITAL_COMMUNITY)
Admission: EM | Admit: 2023-08-13 | Discharge: 2023-08-13 | Disposition: A | Discharge status: Home / Self Care | Attending: Emergency Medicine | Admitting: Emergency Medicine

## 2023-08-13 DIAGNOSIS — R109 Unspecified abdominal pain: Secondary | ICD-10-CM | POA: Diagnosis not present

## 2023-08-13 DIAGNOSIS — R0789 Other chest pain: Secondary | ICD-10-CM | POA: Diagnosis not present

## 2023-08-13 DIAGNOSIS — R1013 Epigastric pain: Secondary | ICD-10-CM | POA: Insufficient documentation

## 2023-08-13 DIAGNOSIS — R079 Chest pain, unspecified: Secondary | ICD-10-CM | POA: Diagnosis not present

## 2023-08-13 LAB — CBC
HCT: 39.6 % (ref 36.0–46.0)
Hemoglobin: 13.1 g/dL (ref 12.0–15.0)
MCH: 28.4 pg (ref 26.0–34.0)
MCHC: 33.1 g/dL (ref 30.0–36.0)
MCV: 85.7 fL (ref 80.0–100.0)
Platelets: 246 10*3/uL (ref 150–400)
RBC: 4.62 MIL/uL (ref 3.87–5.11)
RDW: 12.8 % (ref 11.5–15.5)
WBC: 7.2 10*3/uL (ref 4.0–10.5)
nRBC: 0 % (ref 0.0–0.2)

## 2023-08-13 LAB — HEPATIC FUNCTION PANEL
ALT: 23 U/L (ref 0–44)
AST: 22 U/L (ref 15–41)
Albumin: 4 g/dL (ref 3.5–5.0)
Alkaline Phosphatase: 53 U/L (ref 38–126)
Bilirubin, Direct: <0.1 mg/dL (ref 0.0–0.2)
Total Bilirubin: 0.4 mg/dL (ref 0.0–1.2)
Total Protein: 7.1 g/dL (ref 6.5–8.1)

## 2023-08-13 LAB — LIPASE, BLOOD: Lipase: 29 U/L (ref 11–51)

## 2023-08-13 LAB — BASIC METABOLIC PANEL WITH GFR
Anion gap: 8 (ref 5–15)
BUN: 12 mg/dL (ref 6–20)
CO2: 19 mmol/L — ABNORMAL LOW (ref 22–32)
Calcium: 8.8 mg/dL — ABNORMAL LOW (ref 8.9–10.3)
Chloride: 113 mmol/L — ABNORMAL HIGH (ref 98–111)
Creatinine, Ser: 0.6 mg/dL (ref 0.44–1.00)
GFR, Estimated: >60 mL/min (ref >60)
Glucose, Bld: 164 mg/dL — ABNORMAL HIGH (ref 70–99)
Potassium: 4.1 mmol/L (ref 3.5–5.1)
Sodium: 140 mmol/L (ref 135–145)

## 2023-08-13 LAB — D-DIMER, QUANTITATIVE: D-Dimer, Quant: <0.27 ug{FEU}/mL (ref 0.00–0.50)

## 2023-08-13 LAB — TROPONIN I (HIGH SENSITIVITY)
Troponin I (High Sensitivity): <2 ng/L (ref <18)
Troponin I (High Sensitivity): <2 ng/L (ref <18)

## 2023-08-13 LAB — HCG, SERUM, QUALITATIVE: Preg, Serum: NEGATIVE

## 2023-08-13 MED ORDER — PANTOPRAZOLE SODIUM 40 MG IV SOLR
40.0000 mg | Freq: Once | INTRAVENOUS | Status: AC
  Administered 2023-08-13: 40 mg via INTRAVENOUS
  Filled 2023-08-13: qty 10

## 2023-08-13 MED ORDER — DICYCLOMINE HCL 20 MG PO TABS: 20.0000 mg | ORAL_TABLET | Freq: Three times a day (TID) | ORAL | 0 refills | Status: DC | PRN

## 2023-08-13 MED ORDER — IOHEXOL 350 MG/ML SOLN
75.0000 mL | Freq: Once | INTRAVENOUS | Status: AC | PRN
Start: 2023-08-13 — End: 2023-08-13
  Administered 2023-08-13: 75 mL via INTRAVENOUS

## 2023-08-13 MED ORDER — SIMETHICONE 125 MG PO CHEW: 125.0000 mg | CHEWABLE_TABLET | Freq: Four times a day (QID) | ORAL | 0 refills | Status: DC | PRN

## 2023-08-13 NOTE — ED Triage Notes (Signed)
 PT Complains of chest pain x 2 weeks on left side radiates through to back. Has been burping a lot and pain worsens after eating.

## 2023-08-13 NOTE — Discharge Instructions (Signed)
 Please call your PCP for further H pylori treatment.

## 2023-08-13 NOTE — ED Notes (Signed)
 Patient transported to x-ray. ?

## 2023-08-13 NOTE — ED Provider Notes (Signed)
 Emergency Department Provider Note   I have reviewed the triage vital signs and the nursing notes.   HISTORY  Chief Complaint Chest Pain  Audio Paula Stark interpreter used for this encounter.   HPI Paula Stark is a 33 y.o. female past history reviewed below presents emergency department with multiple weeks of chest and abdominal pain with frequent belching.  She describes a pressure-like sensation in her chest radiating down into the left belly and to the back.  No sharp/stabbing pains.  No shortness of breath.  She was seen and started on Carafate along with famotidine  and Protonix but has been taking these with no relief in symptoms.  No vomiting or diarrhea.   Past Medical History:  Diagnosis Date   Eczema    Gestational diabetes    Hemorrhoid    Postpartum hemorrhage     Review of Systems  Constitutional: No fever/chills Cardiovascular: Positive chest pain. Respiratory: Denies shortness of breath. Gastrointestinal: Positive left abdominal pain. Positive nausea, no vomiting.  No diarrhea.  No constipation. Skin: Negative for rash. Neurological: Negative for headaches, focal weakness or numbness.  ____________________________________________   PHYSICAL EXAM:  VITAL SIGNS: ED Triage Vitals  Encounter Vitals Group     BP 08/13/23 1145 125/88     Resp 08/13/23 1145 (!) 29     Temp 08/13/23 1149 98.3 F (36.8 C)     Temp Source 08/13/23 1149 Oral     SpO2 --      Weight 08/13/23 1145 167 lb 8.8 oz (76 kg)     Height 08/13/23 1145 5\' 2"  (1.575 m)   Constitutional: Alert and oriented. Well appearing and in no acute distress. Eyes: Conjunctivae are normal.  Head: Atraumatic. Nose: No congestion/rhinnorhea. Mouth/Throat: Mucous membranes are moist.  Neck: No stridor.   Cardiovascular: Normal rate, regular rhythm. Good peripheral circulation. Grossly normal heart sounds.   Respiratory: Normal respiratory effort.  No retractions. Lungs CTAB. Gastrointestinal: Soft with  mild left tenderness to the abdomen. No peritonitis. No right side tenderness. No distention.  Musculoskeletal: No lower extremity tenderness nor edema. No gross deformities of extremities. Neurologic:  Normal speech and language. No gross focal neurologic deficits are appreciated.  Skin:  Skin is warm, dry and intact. No rash noted. ____________________________________________   LABS (all labs ordered are listed, but only abnormal results are displayed)  Labs Reviewed  BASIC METABOLIC PANEL WITH GFR - Abnormal; Notable for the following components:      Result Value   Chloride 113 (*)    CO2 19 (*)    Glucose, Bld 164 (*)    Calcium  8.8 (*)    All other components within normal limits  CBC  HCG, SERUM, QUALITATIVE  HEPATIC FUNCTION PANEL  LIPASE, BLOOD  D-DIMER, QUANTITATIVE  TROPONIN I (HIGH SENSITIVITY)  TROPONIN I (HIGH SENSITIVITY)   ____________________________________________  EKG  NSR. Rate 85. Narrow QRS. No STEMI.  ____________________________________________  RADIOLOGY  CT ABDOMEN PELVIS W CONTRAST Result Date: 08/13/2023 CLINICAL DATA:  Acute abdominal pain. EXAM: CT ABDOMEN AND PELVIS WITH CONTRAST TECHNIQUE: Multidetector CT imaging of the abdomen and pelvis was performed using the standard protocol following bolus administration of intravenous contrast. RADIATION DOSE REDUCTION: This exam was performed according to the departmental dose-optimization program which includes automated exposure control, adjustment of the mA and/or kV according to patient size and/or use of iterative reconstruction technique. CONTRAST:  75mL OMNIPAQUE IOHEXOL 350 MG/ML SOLN COMPARISON:  None Available. FINDINGS: Lower Chest: No acute findings. Hepatobiliary: No suspicious hepatic  masses identified. Gallbladder is unremarkable. No evidence of biliary ductal dilatation. Pancreas:  No mass or inflammatory changes. Spleen: Within normal limits in size and appearance. Adrenals/Urinary Tract:  No suspicious masses identified. No evidence of ureteral calculi or hydronephrosis. Stomach/Bowel: No evidence of obstruction, inflammatory process or abnormal fluid collections. Normal appendix visualized. Vascular/Lymphatic: No pathologically enlarged lymph nodes. No acute vascular findings. Reproductive:  No mass or other significant abnormality. Other:  None. Musculoskeletal:  No suspicious bone lesions identified. IMPRESSION: Negative. No acute findings or other significant abnormality. Electronically Signed   By: Marlyce Sine M.D.   On: 08/13/2023 14:22   DG Chest 2 View Result Date: 08/13/2023 CLINICAL DATA:  Chest pain for 2 weeks. EXAM: CHEST - 2 VIEW COMPARISON:  None Available. FINDINGS: The heart size and mediastinal contours are within normal limits. Both lungs are clear. The visualized skeletal structures are unremarkable. IMPRESSION: Normal exam. Electronically Signed   By: Marlyce Sine M.D.   On: 08/13/2023 12:43    ____________________________________________   PROCEDURES  Procedure(s) performed:   Procedures  None ____________________________________________   INITIAL IMPRESSION / ASSESSMENT AND PLAN / ED COURSE  Pertinent labs & imaging results that were available during my care of the patient were reviewed by me and considered in my medical decision making (see chart for details).   This patient is Presenting for Evaluation of CP/abd pain, which does require a range of treatment options, and is a complaint that involves a high risk of morbidity and mortality.  The Differential Diagnoses includes but is not exclusive to acute coronary syndrome, aortic dissection, pulmonary embolism, cardiac tamponade, community-acquired pneumonia, pericarditis, musculoskeletal chest wall pain, etc.   Critical Interventions-    Medications  pantoprazole (PROTONIX) injection 40 mg (40 mg Intravenous Given 08/13/23 1336)  iohexol (OMNIPAQUE) 350 MG/ML injection 75 mL (75 mLs Intravenous  Contrast Given 08/13/23 1404)    Reassessment after intervention: symptoms improved.   I decided to review pertinent External Data, and in summary patient with family medicine visit on 5/16.  H. pylori testing sent with initial breath test positive.  No notes describing plan to treat at this time.    Clinical Laboratory Tests Ordered, included pregnancy negative.  CBC without leukocytosis.  D-dimer and troponin normal.  LFTs and lipase normal. No AKI.   Radiologic Tests Ordered, included CXR, CT A/P. I independently interpreted the images and agree with radiology interpretation.   Cardiac Monitor Tracing which shows NSR.    Social Determinants of Health Risk patient is a smoker.   Medical Decision Making: Summary:  Patient presents to the emergency department for evaluation of left abdominal pain radiating into the chest with frequent belching.  Seems likely GI in nature given frequent belching but some pain to the chest is slightly more concerning and I think warrants at least troponins and D-dimer at this point along with chest x-ray.  Patient's initial H. pylori breath test has come back positive and is being managed by her PCP currently.   Reevaluation with update and discussion with patient using the interpreter.  We discussed the reassuring ED workup including CT imaging.  Plan for her to continue her home medications and have added some other medicines for symptom control.  She has a follow-up breath test with her PCP scheduled who is managing her possible H. pylori infection and will guide treatment moving forward.  She will follow-up with the office for further instruction on this.  Considered admission but ED workup is reassuring. Stable for  discharge.   Patient's presentation is most consistent with acute presentation with potential threat to life or bodily function.   Disposition: discharge  ____________________________________________  FINAL CLINICAL IMPRESSION(S) / ED  DIAGNOSES  Final diagnoses:  Epigastric pain  Atypical chest pain     NEW OUTPATIENT MEDICATIONS STARTED DURING THIS VISIT:  Discharge Medication List as of 08/13/2023  3:10 PM     START taking these medications   Details  dicyclomine (BENTYL) 20 MG tablet Take 1 tablet (20 mg total) by mouth 3 (three) times daily as needed for spasms., Starting Mon 08/13/2023, Normal    simethicone  (GAS RELIEF EXTRA STRENGTH) 125 MG chewable tablet Chew 1 tablet (125 mg total) by mouth every 6 (six) hours as needed for flatulence., Starting Mon 08/13/2023, Normal        Note:  This document was prepared using Dragon voice recognition software and may include unintentional dictation errors.  Abby Hocking, MD, [facility] Emergency [facility]    Demarion Pondexter, Shereen Dike, MD 08/14/23 [phone]

## 2023-08-27 ENCOUNTER — Ambulatory Visit (INDEPENDENT_AMBULATORY_CARE_PROVIDER_SITE_OTHER): Admitting: Family Medicine

## 2023-08-27 ENCOUNTER — Encounter: Payer: Self-pay | Admitting: Family Medicine

## 2023-08-27 VITALS — BP 117/81 | HR 79 | Ht 62.0 in | Wt 166.6 lb

## 2023-08-27 DIAGNOSIS — S2341XA Sprain of ribs, initial encounter: Secondary | ICD-10-CM

## 2023-08-27 DIAGNOSIS — R079 Chest pain, unspecified: Secondary | ICD-10-CM | POA: Diagnosis not present

## 2023-08-27 DIAGNOSIS — K649 Unspecified hemorrhoids: Secondary | ICD-10-CM | POA: Diagnosis not present

## 2023-08-27 MED ORDER — PROCTOFOAM HC 1-1 % EX FOAM: 1.0000 | Freq: Two times a day (BID) | CUTANEOUS | 2 refills | Status: DC

## 2023-08-27 MED ORDER — NAPROXEN 500 MG PO TABS: 500.0000 mg | ORAL_TABLET | Freq: Two times a day (BID) | ORAL | 0 refills | Status: DC

## 2023-08-27 MED ORDER — POLYETHYLENE GLYCOL 3350 17 GM/SCOOP PO POWD: 17.0000 g | Freq: Every day | ORAL | 0 refills | Status: DC | PRN

## 2023-08-27 NOTE — Patient Instructions (Addendum)
 It was wonderful to see you today! Thank you for choosing [facility].   Please bring ALL of your medications with you to every visit.   Today we talked about:  Please return in 1 week for H pylori repeat testing. Depending on the results I may need to refer you to a GI doctor to discuss doing an endoscopy to look at your stomach and GI tract further. I think your symptoms may still be related to reflux. You can still take the Famotidine  but please do not take the Omeprazole  as this can impact the results of the testing. It is okay to stop the Ozempic  for now given your severe symptoms and when you are doing better we can revist it when you are doing better.  I would also like to give you a medication to help with the nerve impingement going from your back around to your left chest.  You can use the naproxen twice per day for the next couple of weeks to see if it helps your pain.  I would also recommend using ice over the affected area.  Please do some home stretching exercises for your thoracic spine.  Please follow up in 2 to 3 weeks to assess symptoms  Call the clinic at [phone] if your symptoms worsen or you have any concerns.  Please be sure to schedule follow up at the front desk before you leave today.   Jonne Netters, DO Family Medicine

## 2023-08-27 NOTE — Progress Notes (Signed)
    SUBJECTIVE:   CHIEF COMPLAINT / HPI:   Chest/back pain Diagnosed with H. pylori on 08/06/2023.  Completed quad therapy treatment.  Seen at the ED for similar pain on 5/26, CTAP negative and CXR negative.  Pain is still severe. Worse with movement. Starts in central back and radiates around to under left breast. Finished H pylori medication on Thursday.   PERTINENT  PMH / PSH: ***  OBJECTIVE:   LMP 07/16/2023 (Approximate)  ***  General: NAD, pleasant, able to participate in exam Cardiac: RRR, no murmurs. Respiratory: CTAB, normal effort, No wheezes, rales or rhonchi Abdomen: Bowel sounds present, nontender, nondistended Extremities: no edema or cyanosis. Skin: warm and dry, no rashes noted Neuro: alert, no obvious focal deficits Psych: Normal affect and mood  ASSESSMENT/PLAN:   No problem-specific Assessment & Plan notes found for this encounter.     Dr. Jonne Netters, DO [facility]    {    This will disappear when note is signed, click to select method of visit    :1}

## 2023-08-29 NOTE — Assessment & Plan Note (Signed)
 Recurrence of symptoms, previously used MiraLAX  and Proctofoam  with relief of symptoms. -MiraLAX  and Proctofoam  as needed

## 2023-09-03 ENCOUNTER — Ambulatory Visit: Admitting: Family Medicine

## 2023-09-10 ENCOUNTER — Other Ambulatory Visit

## 2023-09-10 DIAGNOSIS — A048 Other specified bacterial intestinal infections: Secondary | ICD-10-CM

## 2023-09-12 LAB — H. PYLORI BREATH TEST: H pylori Breath Test: NEGATIVE

## 2023-09-18 ENCOUNTER — Ambulatory Visit: Admitting: Family Medicine

## 2023-09-19 ENCOUNTER — Ambulatory Visit: Payer: Self-pay | Admitting: Family Medicine

## 2023-09-24 NOTE — Progress Notes (Unsigned)
    SUBJECTIVE:   CHIEF COMPLAINT / HPI:   Left-sided chest pain Ongoing, dull but occurring daily.  Previously treated for H. pylori x 14 days and repeat urea breath test negative.  Reports she still having worsening symptoms with food triggers, particularly spicy foods.  Taking famotidine  20 mg daily with incomplete relief of symptoms.  Also reports she still having pain in her left side of her back.  Trialed home exercises but states she still having symptoms.  Finish the naproxen , feels it helps but still having discomfort.  PERTINENT  PMH / PSH: T2DM, H pylori   OBJECTIVE:   BP 113/88   Pulse 72   Ht 5' 2 (1.575 m)   Wt 169 lb 9.6 oz (76.9 kg)   LMP 07/09/2023 (Approximate)   SpO2 100%   BMI 31.02 kg/m    General: NAD, pleasant, able to participate in exam Cardiac: RRR, no murmurs. Respiratory: CTAB, normal effort, No wheezes, rales or rhonchi Abdomen: Bowel sounds present, nontender, nondistended Extremities: no edema or cyanosis. MSK: Thoracic spine/left arm -No erythema, ecchymosis or deformity -TTP over left paraspinal musculature around T4-8 and extends around the rib to under the left breast -Full ROM of left arm without pain -5/5 shoulder flexion and grip strength bilaterally Skin: warm and dry, no rashes noted Neuro: alert, no obvious focal deficits Psych: Normal affect and mood  ASSESSMENT/PLAN:   Assessment & Plan Gastroesophageal reflux disease, unspecified whether esophagitis present Reassuring repeat H. pylori testing negative but continues to have reflux symptoms daily.  Incompletely managed on famotidine , will add PPI.  Given persistence of daily symptoms will refer to GI for discussion of possible endoscopy given higher risk of gastric ulcer/gastritis given recent H. pylori infection. -Pantoprazole  40 mg daily -Referral to GI Chronic left-sided thoracic back pain Possible MSK component to chest/back pain as above given tenderness to palpation worse  with movement.  Not improved with home exercises, will trial physical therapy for targeted relief.  Additionally consider scheduling patient OMT clinic. -Referral to PT -Message adamant about contacting patient to possibly schedule an OMT clinic   Dr. Izetta Nap, DO [facility]

## 2023-09-25 ENCOUNTER — Ambulatory Visit: Admitting: Family Medicine

## 2023-09-25 ENCOUNTER — Encounter: Payer: Self-pay | Admitting: Family Medicine

## 2023-09-25 VITALS — BP 113/88 | HR 72 | Ht 62.0 in | Wt 169.6 lb

## 2023-09-25 DIAGNOSIS — M546 Pain in thoracic spine: Secondary | ICD-10-CM

## 2023-09-25 DIAGNOSIS — K219 Gastro-esophageal reflux disease without esophagitis: Secondary | ICD-10-CM | POA: Diagnosis present

## 2023-09-25 DIAGNOSIS — G8929 Other chronic pain: Secondary | ICD-10-CM | POA: Diagnosis not present

## 2023-09-25 MED ORDER — PANTOPRAZOLE SODIUM 40 MG PO TBEC: 40.0000 mg | DELAYED_RELEASE_TABLET | Freq: Every day | ORAL | 3 refills | Status: DC

## 2023-09-25 NOTE — Patient Instructions (Addendum)
 It was wonderful to see you today! Thank you for choosing [facility].   Please bring ALL of your medications with you to every visit.   Today we talked about:  Please continue to take the famotidine  and I am starting you on another medication for reflux called pantoprazole  that you will take once daily.  This is a stronger medication for your reflux that I hope will improve your symptoms.  Please also avoid any food triggers such as spicy food as this can make symptoms worse.  I also referred you to the GI doctor to discuss if they need to do an endoscopy given your persistence of symptoms. Given the component of pain in the muscles and your left side and back I think going to therapy for evaluation would be beneficial.  I did refer you to physical therapy and our office will call you with this information.  Please use Tylenol  you can take up to 1000 mg 4 times a day for pain relief.  You can also use the lidocaine  patch that I provided for you and if you like it you can find them at the pharmacy over-the-counter as well.  Please follow up in 1 month  Call the clinic at [phone] if your symptoms worsen or you have any concerns.  Please be sure to schedule follow up at the front desk before you leave today.   Paula Nap, DO Family Medicine

## 2023-10-03 DIAGNOSIS — H5213 Myopia, bilateral: Secondary | ICD-10-CM | POA: Diagnosis not present

## 2023-10-04 ENCOUNTER — Other Ambulatory Visit: Payer: Self-pay

## 2023-10-04 ENCOUNTER — Ambulatory Visit

## 2023-10-04 VITALS — BP 133/85 | HR 89 | Ht 62.0 in | Wt 170.8 lb

## 2023-10-04 DIAGNOSIS — Z3042 Encounter for surveillance of injectable contraceptive: Secondary | ICD-10-CM

## 2023-10-04 MED ORDER — MEDROXYPROGESTERONE ACETATE 150 MG/ML IM SUSY
150.0000 mg | PREFILLED_SYRINGE | Freq: Once | INTRAMUSCULAR | Status: AC
  Administered 2023-10-04: 150 mg via INTRAMUSCULAR

## 2023-10-04 NOTE — Progress Notes (Signed)
 Sada Swaim here for Depo-Provera  Injection. Injection administered without complication. Patient will return in 3 months for next injection between October 2 and October 16. Next annual visit due now. Pt advised to schedule annual with pap at the front desk.  Pt verbalized understanding.   Kushi Kun, RN 10/04/2023  10:34 AM

## 2023-10-08 ENCOUNTER — Ambulatory Visit: Admitting: Physical Therapy

## 2023-10-08 ENCOUNTER — Ambulatory Visit: Attending: Family Medicine

## 2023-10-08 ENCOUNTER — Other Ambulatory Visit: Payer: Self-pay

## 2023-10-08 DIAGNOSIS — M6281 Muscle weakness (generalized): Secondary | ICD-10-CM | POA: Diagnosis present

## 2023-10-08 DIAGNOSIS — R293 Abnormal posture: Secondary | ICD-10-CM | POA: Diagnosis present

## 2023-10-08 DIAGNOSIS — G8929 Other chronic pain: Secondary | ICD-10-CM | POA: Insufficient documentation

## 2023-10-08 DIAGNOSIS — M546 Pain in thoracic spine: Secondary | ICD-10-CM | POA: Diagnosis present

## 2023-10-08 NOTE — Progress Notes (Signed)
 OUTPATIENT PHYSICAL THERAPY THORACOLUMBAR EVALUATION   Patient Name: Paula Stark MRN: [id number] DOB:26-Mar-1990, 33 y.o., female Today's Date: 10/08/2023  END OF SESSION:  PT End of Session - 10/08/23 1308     Visit Number 1    Number of Visits 17    Date for PT Re-Evaluation 12/10/23    PT Start Time 1100    PT Stop Time 1142    PT Time Calculation (min) 42 min    Activity Tolerance Patient tolerated treatment well    Behavior During Therapy WFL for tasks assessed/performed          Past Medical History:  Diagnosis Date   Eczema    Gestational diabetes    Hemorrhoid    Postpartum hemorrhage    Past Surgical History:  Procedure Laterality Date   DILATION AND EVACUATION N/A 04/20/2015   Procedure: DILATATION AND EVACUATION;  Surgeon: Lynwood KANDICE Solomons, MD;  Location: [facility];  Service: Gynecology;  Laterality: N/A;   Patient Active Problem List   Diagnosis Date Noted   Type 2 diabetes mellitus without complications (HCC) 10/08/2020   Nexplanon  insertion 06/04/2020   Hemorrhoids 02/03/2020    PCP: Theophilus Pagan, MD  REFERRING PROVIDER: Donzetta Rollene BRAVO, MD  REFERRING DIAG: [phone] (ICD-10-CM) - Chronic left-sided thoracic back pain   Rationale for Evaluation and Treatment: Rehabilitation  THERAPY DIAG:  Pain in thoracic spine  Abnormal posture  Muscle weakness (generalized)  ONSET DATE: April, 2025  SUBJECTIVE:                                                                                                                                                                                           SUBJECTIVE STATEMENT: Pt presents to PT today, referred for left thoracic pain that radiates laterally. Pt states her pain began without a clear onset, woke up one morning in April with pain in her thoracic spine that has not gone away. Pt reports the pain is a deep ache that burns sometimes. Pain is pretty constant and does not get better or worse. 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 The pt reports no emotional or mental burden from her pain.  PERTINENT HISTORY:  DM 2  PAIN:  Are you having pain? Yes: NPRS scale: 5/10 Pain location: L thoracic spine between scapula and spine Pain description: Burning, shooting, deep Aggravating factors: Right thoracic rotation Relieving factors: none specified  PRECAUTIONS: None  RED FLAGS: None   WEIGHT BEARING RESTRICTIONS: No  FALLS:  Has patient fallen in last 6 months? No  LIVING ENVIRONMENT: Lives with: lives with  their family Lives in: House/apartment Has following equipment at home: None  OCCUPATION: Stay at home Mom  PLOF: Independent  PATIENT GOALS: Reduce thoracic pain, pain free with ADLs  NEXT MD VISIT: 10/30/2023  OBJECTIVE:  Note: Objective measures were completed at Evaluation unless otherwise noted.  DIAGNOSTIC FINDINGS:  N/a  PATIENT SURVEYS:  ODI - will assess next session d/t unavailable translator  COGNITION: Overall cognitive status: Within functional limits for tasks assessed     SENSATION: Not tested  MUSCLE LENGTH:   POSTURE: rounded shoulders, forward head, and increased thoracic kyphosis  PALPATION: TTP - rhomboids  THORACIC ROM:   AROM eval  Flexion WNL  Extension 75%  Right lateral flexion 75%  Left lateral flexion 75%  Right rotation 50%  Left rotation WNL   (Blank rows = not tested)  UPPER EXTREMITY ROM:  Active ROM Right eval Left eval  Shoulder flexion WNL WNL  Shoulder extension    Shoulder abduction WNL WNL  Shoulder adduction    Shoulder extension    Shoulder internal rotation    Shoulder external rotation    Elbow flexion    Elbow extension    Wrist flexion    Wrist extension    Wrist ulnar deviation    Wrist radial deviation    Wrist pronation    Wrist supination     (Blank rows = not tested) UPPER EXTREMITY MMT:  MMT Right eval Left eval  Shoulder flexion    Shoulder extension     Shoulder abduction    Shoulder adduction    Shoulder extension    Shoulder internal rotation    Shoulder external rotation 4+ 4  Middle trapezius 3 3  Lower trapezius 3 3  Elbow flexion    Elbow extension    Wrist flexion    Wrist extension    Wrist ulnar deviation    Wrist radial deviation    Wrist pronation    Wrist supination    Grip strength     (Blank rows = not tested)  FUNCTIONAL TESTS:     GAIT: Distance walked: 67ft Assistive device utilized: None Level of assistance: Complete Independence Comments: slight lateral sway  TREATMENT: [facility] Adult PT Treatment:                                                DATE: 7/21 Therapeutic Exercise: RTB seated row  RTB band pull aparts  Needle threads against wall Prone I's and Y's Manual Therapy: STM to L rhomboids                                                                                                                                    PATIENT EDUCATION:  Education details: Provided education on evaluation findings, expectations, and performance of HEP Person  educated: Patient Education method: Explanation, Demonstration, and Handouts Education comprehension: verbalized understanding and returned demonstration  HOME EXERCISE PROGRAM: Access Code: [email or web address] URL:  [email or web address]   Date: 10/08/2023 Prepared by: Alm Kingdom  Exercises - Seated Scapular Retraction  - 1 x daily - 7 x weekly - 2 sets - 10 reps - 3 sec hold   Scapular Retraction with Resistance  - 1 x daily - 7 x weekly - 3 sets - 10 reps - red band hold   Seated Shoulder Horizontal Abduction with Resistance  - 1 x daily - 7 x weekly - 3 sets - 10 reps - red band hold   Plank with Thoracic Rotation on Counter  - 1 x daily - 7 x weekly - 2 sets - 10 reps   Prone Scapular Retraction Y  - 1 x daily - 7 x weekly - 2 sets - 10 reps - Prone I  - 1 x daily - 7 x weekly - 2 sets - 10 reps   ASSESSMENT:  CLINICAL  IMPRESSION: Patient is a 33 y.o. F who was seen today for physical therapy evaluation and treatment for L sided thoracic pain. Pt presentation is consistent with L periscapular weakness. Symptoms began this past April and have been constant ever since. Pts activities as a mother with young kids as well as posture may be big contributors to the presentation. Concordant symptoms were provoked with ROM testing, during manual therapy the patient remarked that the pain she feels may be a little bit deeper. Overall, the patient will benefit from skilled physical therapy to address deficits in strengthen and ROM in order to improve participation in daily life.  OBJECTIVE IMPAIRMENTS: decreased mobility, decreased ROM, decreased strength, postural dysfunction, and pain.   ACTIVITY LIMITATIONS: carrying, lifting, bending, transfers, bed mobility, toileting, dressing, and caring for others  PARTICIPATION LIMITATIONS: meal prep, cleaning, laundry, and driving  PERSONAL FACTORS: Fitness and 1 comorbidity: DM 2 are also affecting patient's functional outcome.   REHAB POTENTIAL: Good - clear mechanical provocation  CLINICAL DECISION MAKING: Stable/uncomplicated  EVALUATION COMPLEXITY: Low   GOALS: Goals reviewed with patient? No  SHORT TERM GOALS: Target date: 10/29/2023  Pt will demonstration understanding and performance of HEP to facilitate independence with managing symptoms  Baseline: Goal status: INITIAL  2.  Pt will decrease pain to 3/10 with R thoracic rotation to show improved tolerance with daily activities  Baseline: 5/10 Goal status: INITIAL   LONG TERM GOALS: Target date: 12/03/2023  Pt will improve ODI score by MCID to show improvement in functional tolerance  Baseline: will assess next session Goal status: INITIAL  2.  Pt will decrease pain to 1/10 with R thoracic rotation to show improved tolerance with daily activities Baseline: 5/10 Goal status: INITIAL  3.  Pt will  improve L and R lower and middle trap MMT's to 4+/5 to enhance scapular control and improve posture during functional tasks  Baseline: 3/5 Goal status: INITIAL   4. Pt will improve all thoracic AROM to WNL to improve functional abilities with reaching, lifting, and carrying activities    Baseline: Extension, side bending - 75% , R rotation - 50%    Goal status: INITIAL    PLAN:  PT FREQUENCY: 1-2x/week  PT DURATION: 4 weeks  PLANNED INTERVENTIONS: 97110-Therapeutic exercises, 97530- Therapeutic activity, [id number]- Neuromuscular re-education, 97535- Self Care, and [id number]- Manual therapy.  PLAN FOR NEXT SESSION: Plan to assess response to HEP, continue with periscapular strengthening and thoracic mobility. Assess rhomboid tenderness.  Janeene Sand, Student-PT 10/08/2023, 2:31 PM

## 2023-10-09 ENCOUNTER — Other Ambulatory Visit: Payer: Self-pay | Admitting: Family Medicine

## 2023-10-09 DIAGNOSIS — R079 Chest pain, unspecified: Secondary | ICD-10-CM

## 2023-10-15 ENCOUNTER — Ambulatory Visit

## 2023-10-15 DIAGNOSIS — M6281 Muscle weakness (generalized): Secondary | ICD-10-CM

## 2023-10-15 DIAGNOSIS — R293 Abnormal posture: Secondary | ICD-10-CM

## 2023-10-15 DIAGNOSIS — M546 Pain in thoracic spine: Secondary | ICD-10-CM | POA: Diagnosis not present

## 2023-10-15 NOTE — Therapy (Signed)
 OUTPATIENT PHYSICAL THERAPY TREATMENT     Patient Name: Paula Stark MRN: [id number] DOB:[birth year], 33 y.o., female Today's Date: 10/08/2023   END OF SESSION:  PT End of Session - 10/15/23 0800     Visit Number 2    Number of Visits 17    Date for PT Re-Evaluation 12/10/23    PT Start Time 0800    PT Stop Time 0840    PT Time Calculation (min) 40 min    Activity Tolerance Patient tolerated treatment well    Behavior During Therapy WFL for tasks assessed/performed            PCP: Theophilus Pagan, MD   REFERRING PROVIDER: Donzetta Rollene BRAVO, MD   REFERRING DIAG: [phone] (ICD-10-CM) - Chronic left-sided thoracic back pain    Rationale for Evaluation and Treatment: Rehabilitation   THERAPY DIAG:  Pain in thoracic spine  Abnormal posture  Muscle weakness (generalized)    ONSET DATE: April, 2025   SUBJECTIVE:                                                                                                                                                                                            SUBJECTIVE STATEMENT: Pt presents to PT with continued 3-5/10 in L periscapular area. Has been compliant with HEP.   EVAL: Pt presents to PT today, referred for left thoracic pain that radiates laterally. Pt states her pain began without a clear onset, woke up one morning in April with pain in her thoracic spine that has not gone away. Pt reports the pain is a deep ache that burns sometimes. Pain is pretty constant and does not get better or worse. 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 The pt reports no emotional or mental burden from her pain.   PERTINENT HISTORY:  DM 2   PAIN:  Are you having pain? Yes: NPRS scale: 5/10 Pain location: L thoracic spine between scapula and spine Pain description: Burning, shooting, deep Aggravating factors: Right thoracic rotation Relieving factors: none specified   PRECAUTIONS: None   RED FLAGS: None       WEIGHT BEARING RESTRICTIONS: No   FALLS:  Has patient fallen in last 6 months? No   LIVING ENVIRONMENT: Lives with: lives with their family Lives in: House/apartment Has following equipment at home: None   OCCUPATION: Stay at home Mom   PLOF: Independent   PATIENT GOALS: Reduce thoracic pain, pain free with ADLs   NEXT MD VISIT: 10/30/2023   OBJECTIVE:  Note: Objective measures were completed at Evaluation unless otherwise noted.  DIAGNOSTIC FINDINGS:  N/a   PATIENT SURVEYS:  ODI: 5/50   COGNITION: Overall cognitive status: Within functional limits for tasks assessed                          SENSATION: Not tested   MUSCLE LENGTH:     POSTURE: rounded shoulders, forward head, and increased thoracic kyphosis   PALPATION: TTP - rhomboids   THORACIC ROM:    AROM eval  Flexion WNL  Extension 75%  Right lateral flexion 75%  Left lateral flexion 75%  Right rotation 50%  Left rotation WNL   (Blank rows = not tested)   UPPER EXTREMITY ROM:   Active ROM Right eval Left eval  Shoulder flexion WNL WNL  Shoulder extension      Shoulder abduction WNL WNL  Shoulder adduction      Shoulder extension      Shoulder internal rotation      Shoulder external rotation      Elbow flexion      Elbow extension      Wrist flexion      Wrist extension      Wrist ulnar deviation      Wrist radial deviation      Wrist pronation      Wrist supination       (Blank rows = not tested) UPPER EXTREMITY MMT:   MMT Right eval Left eval  Shoulder flexion      Shoulder extension      Shoulder abduction      Shoulder adduction      Shoulder extension      Shoulder internal rotation      Shoulder external rotation 4+ 4  Middle trapezius 3 3  Lower trapezius 3 3  Elbow flexion      Elbow extension      Wrist flexion      Wrist extension      Wrist ulnar deviation      Wrist radial deviation      Wrist pronation      Wrist supination      Grip strength        (Blank rows = not tested)   FUNCTIONAL TESTS:      GAIT: Distance walked: 52ft Assistive device utilized: None Level of assistance: Complete Independence Comments: slight lateral sway   TREATMENT: [facility] Adult PT Treatment:                                                DATE: 10/15/2023 UBE lvl 1.0 x 3 min for functional activity tolerance S/L open books x 10 ea Supine horizontal abd 2x10 RTB Supine dow flexion x 15 Seated bilateral ER 2x10 RTB Qped thread the needle with soft foam x 10 ea Hooklying thoracic ext over soft foam x 10 FM row 2x10 13# Manual Therapy: STM to L rhomboids IASTM to L rhomboids and paraspinals Grade III PA mobs to T1-4  [facility] Adult PT Treatment:                                                DATE: 10/08/2023 Therapeutic Exercise: RTB seated row  RTB band pull aparts  Needle  threads against wall Prone I's and Y's Manual Therapy: STM to L rhomboids   PATIENT EDUCATION:  Education details: Provided education on evaluation findings, expectations, and performance of HEP Person educated: Patient Education method: Explanation, Demonstration, and Handouts Education comprehension: verbalized understanding and returned demonstration   HOME EXERCISE PROGRAM: Access Code: [email or web address] URL:  [email or web address]    Date: 10/08/2023 Prepared by: Alm Kingdom  Exercises - Seated Scapular Retraction  - 1 x daily - 7 x weekly - 2 sets - 10 reps - 3 sec hold    Scapular Retraction with Resistance  - 1 x daily - 7 x weekly - 3 sets - 10 reps - red band hold    Seated Shoulder Horizontal Abduction with Resistance  - 1 x daily - 7 x weekly - 3 sets - 10 reps - red band hold    Plank with Thoracic Rotation on Counter  - 1 x daily - 7 x weekly - 2 sets - 10 reps    Prone Scapular Retraction Y  - 1 x daily - 7 x weekly - 2 sets - 10 reps - Prone I  - 1 x daily - 7 x weekly - 2 sets - 10 reps    ASSESSMENT:   CLINICAL IMPRESSION: Pt was able to  complete all prescribed exercises with mild discomfort in L periscapular area. Exercises focused on improving strength and thoracic mobility. Responded well to manual therapy interventions noting decreased pain and discomfort. Continues to benefit from skilled PT, will continue per POC.   EVAL: Patient is a 33 y.o. F who was seen today for physical therapy evaluation and treatment for L sided thoracic pain. Pt presentation is consistent with L periscapular weakness. Symptoms began this past April and have been constant ever since. Pts activities as a mother with young kids as well as posture may be big contributors to the presentation. Concordant symptoms were provoked with ROM testing, during manual therapy the patient remarked that the pain she feels may be a little bit deeper. Overall, the patient will benefit from skilled physical therapy to address deficits in strengthen and ROM in order to improve participation in daily life.   OBJECTIVE IMPAIRMENTS: decreased mobility, decreased ROM, decreased strength, postural dysfunction, and pain.    ACTIVITY LIMITATIONS: carrying, lifting, bending, transfers, bed mobility, toileting, dressing, and caring for others   PARTICIPATION LIMITATIONS: meal prep, cleaning, laundry, and driving   PERSONAL FACTORS: Fitness and 1 comorbidity: DM 2 are also affecting patient's functional outcome.    REHAB POTENTIAL: Good - clear mechanical provocation   CLINICAL DECISION MAKING: Stable/uncomplicated   EVALUATION COMPLEXITY: Low     GOALS: Goals reviewed with patient? No   SHORT TERM GOALS: Target date: 10/29/2023   Pt will demonstration understanding and performance of HEP to facilitate independence with managing symptoms  Baseline: Goal status: INITIAL   2.  Pt will decrease pain to 3/10 with R thoracic rotation to show improved tolerance with daily activities  Baseline: 5/10 Goal status: INITIAL     LONG TERM GOALS: Target date: 12/03/2023   Pt  will improve ODI score by MCID to show improvement in functional tolerance  Baseline: 5/50 Goal status: INITIAL   2.  Pt will decrease pain to 1/10 with R thoracic rotation to show improved tolerance with daily activities Baseline: 5/10 Goal status: INITIAL   3.  Pt will improve L and R lower and middle trap MMT's to 4+/5 to enhance scapular control and improve posture during functional  tasks  Baseline: 3/5 Goal status: INITIAL                       4. Pt will improve all thoracic AROM to WNL to improve functional abilities with reaching, lifting, and carrying activities                                  Baseline: Extension, side bending - 75% , R rotation - 50%                                  Goal status: INITIAL      PLAN:   PT FREQUENCY: 1-2x/week   PT DURATION: 4 weeks   PLANNED INTERVENTIONS: 97110-Therapeutic exercises, 97530- Therapeutic activity, [id number]- Neuromuscular re-education, 97535- Self Care, and 02859- Manual therapy.   PLAN FOR NEXT SESSION: Plan to assess response to HEP, continue with periscapular strengthening and thoracic mobility. Assess rhomboid tenderness.

## 2023-10-19 ENCOUNTER — Other Ambulatory Visit: Payer: Self-pay | Admitting: Family Medicine

## 2023-10-19 ENCOUNTER — Ambulatory Visit: Attending: Family Medicine

## 2023-10-19 DIAGNOSIS — M546 Pain in thoracic spine: Secondary | ICD-10-CM | POA: Insufficient documentation

## 2023-10-19 DIAGNOSIS — K219 Gastro-esophageal reflux disease without esophagitis: Secondary | ICD-10-CM

## 2023-10-19 DIAGNOSIS — M6281 Muscle weakness (generalized): Secondary | ICD-10-CM | POA: Diagnosis present

## 2023-10-19 DIAGNOSIS — R293 Abnormal posture: Secondary | ICD-10-CM | POA: Diagnosis present

## 2023-10-19 NOTE — Therapy (Signed)
 OUTPATIENT PHYSICAL THERAPY TREATMENT     Patient Name: Paula Stark MRN: [id number] DOB:1990-10-10, 32 y.o., female Today's Date: 10/08/2023   END OF SESSION:  PT End of Session - 10/19/23 0755     Visit Number 3    Number of Visits 17    Date for PT Re-Evaluation 12/10/23    PT Start Time 0758    PT Stop Time 0840    PT Time Calculation (min) 42 min    Activity Tolerance Patient tolerated treatment well    Behavior During Therapy WFL for tasks assessed/performed             PCP: Theophilus Pagan, MD   REFERRING PROVIDER: Donzetta Rollene BRAVO, MD   REFERRING DIAG: [phone] (ICD-10-CM) - Chronic left-sided thoracic back pain    Rationale for Evaluation and Treatment: Rehabilitation   THERAPY DIAG:  Pain in thoracic spine  Abnormal posture  Muscle weakness (generalized)    ONSET DATE: April, 2025   SUBJECTIVE:                                                                                                                                                                                            SUBJECTIVE STATEMENT: Pt reports feeling better with exercises but by the night or next morning her pain comes back. Still reports some chest pain that occurs with her thoracic pain when rolling over in bed.   EVAL: Pt presents to PT today, referred for left thoracic pain that radiates laterally. Pt states her pain began without a clear onset, woke up one morning in April with pain in her thoracic spine that has not gone away. Pt reports the pain is a deep ache that burns sometimes. Pain is pretty constant and does not get better or worse. 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 The pt reports no emotional or mental burden from her pain.   PERTINENT HISTORY:  DM 2   PAIN:  Are you having pain? Yes: NPRS scale: 5/10 Pain location: L thoracic spine between scapula and spine Pain description: Burning, shooting, deep Aggravating factors: Right thoracic  rotation Relieving factors: none specified   PRECAUTIONS: None   RED FLAGS: None      WEIGHT BEARING RESTRICTIONS: No   FALLS:  Has patient fallen in last 6 months? No   LIVING ENVIRONMENT: Lives with: lives with their family Lives in: House/apartment Has following equipment at home: None   OCCUPATION: Stay at home Mom   PLOF: Independent   PATIENT GOALS: Reduce thoracic pain, pain free with ADLs   NEXT MD  VISIT: 10/30/2023   OBJECTIVE:  Note: Objective measures were completed at Evaluation unless otherwise noted.   DIAGNOSTIC FINDINGS:  N/a   PATIENT SURVEYS:  ODI: 5/50   COGNITION: Overall cognitive status: Within functional limits for tasks assessed                          SENSATION: Not tested   MUSCLE LENGTH:     POSTURE: rounded shoulders, forward head, and increased thoracic kyphosis   PALPATION: TTP - rhomboids   THORACIC ROM:    AROM eval  Flexion WNL  Extension 75%  Right lateral flexion 75%  Left lateral flexion 75%  Right rotation 50%  Left rotation WNL   (Blank rows = not tested)   UPPER EXTREMITY ROM:   Active ROM Right eval Left eval  Shoulder flexion WNL WNL  Shoulder extension      Shoulder abduction WNL WNL  Shoulder adduction      Shoulder extension      Shoulder internal rotation      Shoulder external rotation      Elbow flexion      Elbow extension      Wrist flexion      Wrist extension      Wrist ulnar deviation      Wrist radial deviation      Wrist pronation      Wrist supination       (Blank rows = not tested) UPPER EXTREMITY MMT:   MMT Right eval Left eval  Shoulder flexion      Shoulder extension      Shoulder abduction      Shoulder adduction      Shoulder extension      Shoulder internal rotation      Shoulder external rotation 4+ 4  Middle trapezius 3 3  Lower trapezius 3 3  Elbow flexion      Elbow extension      Wrist flexion      Wrist extension      Wrist ulnar deviation      Wrist  radial deviation      Wrist pronation      Wrist supination      Grip strength       (Blank rows = not tested)   FUNCTIONAL TESTS:      GAIT: Distance walked: 16ft Assistive device utilized: None Level of assistance: Complete Independence Comments: slight lateral sway   TREATMENT: [facility] Adult PT Treatment:                                                DATE: 10/19/2023 UBE lvl 1.0 x 3 min for functional activity tolerance S/L open books x 10 ea Supine horizontal abd 2x10 RTB Prone Y's 3x8 Seated bilateral ER 3x15 RTB towel under elbow Standing rtb T's 2x15 Standing rtb Y's Foam roller rollouts up wall for thoracic extension 2x10 2 hold FM row 2x8 10# 15# Manual Therapy: STM to L rhomboids   [facility] Adult PT Treatment:                                                DATE: 10/15/2023 UBE lvl 1.0  x 3 min for functional activity tolerance S/L open books x 10 ea Supine horizontal abd 2x10 RTB Supine dow flexion x 15 Seated bilateral ER 2x10 RTB Qped thread the needle with soft foam x 10 ea Hooklying thoracic ext over soft foam x 10 FM row 2x10 13# Manual Therapy: STM to L rhomboids IASTM to L rhomboids and paraspinals Grade III PA mobs to T1-4  [facility] Adult PT Treatment:                                                DATE: 10/08/2023 Therapeutic Exercise: RTB seated row  RTB band pull aparts  Needle threads against wall Prone I's and Y's Manual Therapy: STM to L rhomboids   PATIENT EDUCATION:  Education details: Provided education on evaluation findings, expectations, and performance of HEP Person educated: Patient Education method: Explanation, Demonstration, and Handouts Education comprehension: verbalized understanding and returned demonstration   HOME EXERCISE PROGRAM: Access Code: [email or web address] URL:  [email or web address]    Date: 10/08/2023 Prepared by: Alm Kingdom  Exercises - Seated Scapular Retraction  - 1 x daily - 7 x weekly - 2 sets - 10 reps - 3  sec hold    Scapular Retraction with Resistance  - 1 x daily - 7 x weekly - 3 sets - 10 reps - red band hold    Seated Shoulder Horizontal Abduction with Resistance  - 1 x daily - 7 x weekly - 3 sets - 10 reps - red band hold    Plank with Thoracic Rotation on Counter  - 1 x daily - 7 x weekly - 2 sets - 10 reps    Prone Scapular Retraction Y  - 1 x daily - 7 x weekly - 2 sets - 10 reps - Prone I  - 1 x daily - 7 x weekly - 2 sets - 10 reps    ASSESSMENT:   CLINICAL IMPRESSION: Pt completed all exercises without discomfort or increase in pain. Session focused on decreasing soreness of thoracic musculature, postural strenghtening and mobility Responded well to manual therapy interventions noting decreased pain and discomfort. Pt is responding well to therapy, plan to continue with strengthening exercises and progress when pt is ready. Pt continues to benefit from skilled PT and will continue with POC.  EVAL: Patient is a 33 y.o. F who was seen today for physical therapy evaluation and treatment for L sided thoracic pain. Pt presentation is consistent with L periscapular weakness. Symptoms began this past April and have been constant ever since. Pts activities as a mother with young kids as well as posture may be big contributors to the presentation. Concordant symptoms were provoked with ROM testing, during manual therapy the patient remarked that the pain she feels may be a little bit deeper. Overall, the patient will benefit from skilled physical therapy to address deficits in strengthen and ROM in order to improve participation in daily life.   OBJECTIVE IMPAIRMENTS: decreased mobility, decreased ROM, decreased strength, postural dysfunction, and pain.    ACTIVITY LIMITATIONS: carrying, lifting, bending, transfers, bed mobility, toileting, dressing, and caring for others   PARTICIPATION LIMITATIONS: meal prep, cleaning, laundry, and driving   PERSONAL FACTORS: Fitness and 1 comorbidity: DM 2  are also affecting patient's functional outcome.    REHAB POTENTIAL: Good - clear mechanical provocation   CLINICAL DECISION MAKING: Stable/uncomplicated  EVALUATION COMPLEXITY: Low     GOALS: Goals reviewed with patient? No   SHORT TERM GOALS: Target date: 10/29/2023   Pt will demonstration understanding and performance of HEP to facilitate independence with managing symptoms  Baseline: Goal status: INITIAL   2.  Pt will decrease pain to 3/10 with R thoracic rotation to show improved tolerance with daily activities  Baseline: 5/10 Goal status: INITIAL     LONG TERM GOALS: Target date: 12/03/2023   Pt will improve ODI score by MCID to show improvement in functional tolerance  Baseline: 5/50 Goal status: INITIAL   2.  Pt will decrease pain to 1/10 with R thoracic rotation to show improved tolerance with daily activities Baseline: 5/10 Goal status: INITIAL   3.  Pt will improve L and R lower and middle trap MMT's to 4+/5 to enhance scapular control and improve posture during functional tasks  Baseline: 3/5 Goal status: INITIAL                       4. Pt will improve all thoracic AROM to WNL to improve functional abilities with reaching, lifting, and carrying activities                                  Baseline: Extension, side bending - 75% , R rotation - 50%                                  Goal status: INITIAL      PLAN:   PT FREQUENCY: 1-2x/week   PT DURATION: 4 weeks   PLANNED INTERVENTIONS: 97110-Therapeutic exercises, 97530- Therapeutic activity, 97112- Neuromuscular re-education, 97535- Self Care, and 02859- Manual therapy.   PLAN FOR NEXT SESSION: Plan to assess response to HEP, continue with periscapular strengthening and thoracic mobility. Assess rhomboid tenderness.  Aqib Lough, [place] 10/19/23 8:43 AM

## 2023-10-22 ENCOUNTER — Other Ambulatory Visit: Payer: Self-pay | Admitting: *Deleted

## 2023-10-22 DIAGNOSIS — E119 Type 2 diabetes mellitus without complications: Secondary | ICD-10-CM

## 2023-10-22 MED ORDER — ACCU-CHEK GUIDE TEST VI STRP: ORAL_STRIP | 12 refills | Status: DC

## 2023-10-22 NOTE — Progress Notes (Unsigned)
   SUBJECTIVE:   CHIEF COMPLAINT / HPI:   L sided thoracic back pain - previously tried home exercises. Recently referred to PT.  - tried naproxen .  - worse with movement  OBJECTIVE:   There were no vitals taken for this visit.  ***  ASSESSMENT/PLAN:   No problem-specific Assessment & Plan notes found for this encounter.     Donald CHRISTELLA Lai, DO

## 2023-10-23 ENCOUNTER — Encounter: Payer: Self-pay | Admitting: Family Medicine

## 2023-10-23 ENCOUNTER — Ambulatory Visit (INDEPENDENT_AMBULATORY_CARE_PROVIDER_SITE_OTHER): Admitting: Family Medicine

## 2023-10-23 DIAGNOSIS — M9902 Segmental and somatic dysfunction of thoracic region: Secondary | ICD-10-CM

## 2023-10-23 DIAGNOSIS — M9905 Segmental and somatic dysfunction of pelvic region: Secondary | ICD-10-CM | POA: Diagnosis not present

## 2023-10-23 DIAGNOSIS — M546 Pain in thoracic spine: Secondary | ICD-10-CM

## 2023-10-23 DIAGNOSIS — M9901 Segmental and somatic dysfunction of cervical region: Secondary | ICD-10-CM

## 2023-10-23 DIAGNOSIS — M9900 Segmental and somatic dysfunction of head region: Secondary | ICD-10-CM | POA: Diagnosis not present

## 2023-10-23 DIAGNOSIS — G8929 Other chronic pain: Secondary | ICD-10-CM

## 2023-10-23 DIAGNOSIS — M9908 Segmental and somatic dysfunction of rib cage: Secondary | ICD-10-CM

## 2023-10-23 NOTE — Assessment & Plan Note (Signed)
 With compensatory somatic dysfunction including hypertonic musculature and fascial strain. OMT performed today with good results, see above. F/u 2-4 weeks for retreatment or sooner if needed. Continue PT treatments, PPI and stretching exercises demonstrated today, see AVS for details.

## 2023-10-23 NOTE — Patient Instructions (Addendum)
 It was great to see you!  Our plans for today:  - Place a tennis ball or similar shaped ball at the area of maximum pain and lay on this for at least 30 minutes each night. Lay on it in the morning also if you are able.  - Continue your physical therapy exercises. - Continue your pantoprazole  and famotidine .  - Come back in 2-4 weeks for your next treatment as needed.  Take care and seek immediate care sooner if you develop any concerns.   Dr. Jamilah Jean

## 2023-10-29 ENCOUNTER — Other Ambulatory Visit: Payer: Self-pay

## 2023-10-29 ENCOUNTER — Ambulatory Visit

## 2023-10-29 DIAGNOSIS — E119 Type 2 diabetes mellitus without complications: Secondary | ICD-10-CM

## 2023-10-29 DIAGNOSIS — M6281 Muscle weakness (generalized): Secondary | ICD-10-CM

## 2023-10-29 DIAGNOSIS — M546 Pain in thoracic spine: Secondary | ICD-10-CM

## 2023-10-29 DIAGNOSIS — R293 Abnormal posture: Secondary | ICD-10-CM

## 2023-10-29 NOTE — Therapy (Signed)
 OUTPATIENT PHYSICAL THERAPY TREATMENT     Patient Name: Paula Stark MRN: [id number] DOB:[birth year], 33 y.o., female Today's Date: 10/08/2023   END OF SESSION:  PT End of Session - 10/29/23 0800     Visit Number 4    Number of Visits 17    Date for PT Re-Evaluation 12/10/23    PT Start Time 0801    PT Stop Time 0840    PT Time Calculation (min) 39 min    Activity Tolerance Patient tolerated treatment well    Behavior During Therapy WFL for tasks assessed/performed              PCP: Theophilus Pagan, MD   REFERRING PROVIDER: Donzetta Rollene BRAVO, MD   REFERRING DIAG: [phone] (ICD-10-CM) - Chronic left-sided thoracic back pain    Rationale for Evaluation and Treatment: Rehabilitation   THERAPY DIAG:  Pain in thoracic spine  Abnormal posture  Muscle weakness (generalized)    ONSET DATE: April, 2025   SUBJECTIVE:                                                                                                                                                                                            SUBJECTIVE STATEMENT: Pt reports feeling relief after exercises but come the next morning she is still in pain, 6/10 most of the time. Still getting pain that wraps around her abdomen into chest.   EVAL: Pt presents to PT today, referred for left thoracic pain that radiates laterally. Pt states her pain began without a clear onset, woke up one morning in April with pain in her thoracic spine that has not gone away. Pt reports the pain is a deep ache that burns sometimes. Pain is pretty constant and does not get better or worse. 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 The pt reports no emotional or mental burden from her pain.   PERTINENT HISTORY:  DM 2   PAIN:  Are you having pain? Yes: NPRS scale: 5/10 Pain location: L thoracic spine between scapula and spine Pain description: Burning, shooting, deep Aggravating factors: Right thoracic  rotation Relieving factors: none specified   PRECAUTIONS: None   RED FLAGS: None      WEIGHT BEARING RESTRICTIONS: No   FALLS:  Has patient fallen in last 6 months? No   LIVING ENVIRONMENT: Lives with: lives with their family Lives in: House/apartment Has following equipment at home: None   OCCUPATION: Stay at home Mom   PLOF: Independent   PATIENT GOALS: Reduce thoracic pain, pain free with ADLs   NEXT MD VISIT:  10/30/2023   OBJECTIVE:  Note: Objective measures were completed at Evaluation unless otherwise noted.   DIAGNOSTIC FINDINGS:  N/a   PATIENT SURVEYS:  ODI: 5/50   COGNITION: Overall cognitive status: Within functional limits for tasks assessed                          SENSATION: Not tested   MUSCLE LENGTH:     POSTURE: rounded shoulders, forward head, and increased thoracic kyphosis   PALPATION: TTP - rhomboids   THORACIC ROM:    AROM eval  Flexion WNL  Extension 75%  Right lateral flexion 75%  Left lateral flexion 75%  Right rotation 50%  Left rotation WNL   (Blank rows = not tested)   UPPER EXTREMITY ROM:   Active ROM Right eval Left eval  Shoulder flexion WNL WNL  Shoulder extension      Shoulder abduction WNL WNL  Shoulder adduction      Shoulder extension      Shoulder internal rotation      Shoulder external rotation      Elbow flexion      Elbow extension      Wrist flexion      Wrist extension      Wrist ulnar deviation      Wrist radial deviation      Wrist pronation      Wrist supination       (Blank rows = not tested) UPPER EXTREMITY MMT:   MMT Right eval Left eval  Shoulder flexion      Shoulder extension      Shoulder abduction      Shoulder adduction      Shoulder extension      Shoulder internal rotation      Shoulder external rotation 4+ 4  Middle trapezius 3 3  Lower trapezius 3 3  Elbow flexion      Elbow extension      Wrist flexion      Wrist extension      Wrist ulnar deviation      Wrist  radial deviation      Wrist pronation      Wrist supination      Grip strength       (Blank rows = not tested)   FUNCTIONAL TESTS:      GAIT: Distance walked: 78ft Assistive device utilized: None Level of assistance: Complete Independence Comments: slight lateral sway   TREATMENT: [facility] Adult PT Treatment:                                              DATE: 10/19/2023 UBE lvl 1.0 x 3 min for functional activity tolerance Quadruped Y's x10 Foam roller rollouts up wall for thoracic extension 2x10 2 hold Cat Cow x15 Supine rtb pull apart, towel rolled under t spine  Manual Therapy: STM to L rhomboids Grd 3 mobs at upper,mid T spine - elicited pain wrapping around spine  [facility] Adult PT Treatment:                                                DATE: 10/19/2023 UBE lvl 1.0 x 3 min for functional activity tolerance S/L open  books x 10 ea Supine horizontal abd 2x10 RTB Prone Y's 3x8 Seated bilateral ER 3x15 RTB towel under elbow Standing rtb T's 2x15 Standing rtb Y's Foam roller rollouts up wall for thoracic extension 2x10 2 hold FM row 2x8 10# 15# Manual Therapy: STM to L rhomboids   [facility] Adult PT Treatment:                                                DATE: 10/15/2023 UBE lvl 1.0 x 3 min for functional activity tolerance S/L open books x 10 ea Supine horizontal abd 2x10 RTB Supine dow flexion x 15 Seated bilateral ER 2x10 RTB Qped thread the needle with soft foam x 10 ea Hooklying thoracic ext over soft foam x 10 FM row 2x10 13# Manual Therapy: STM to L rhomboids IASTM to L rhomboids and paraspinals Grade III PA mobs to T1-4  [facility] Adult PT Treatment:                                                DATE: 10/08/2023 Therapeutic Exercise: RTB seated row  RTB band pull aparts  Needle threads against wall Prone I's and Y's Manual Therapy: STM to L rhomboids   PATIENT EDUCATION:  Education details: Provided education on evaluation findings, expectations, and performance  of HEP Person educated: Patient Education method: Explanation, Demonstration, and Handouts Education comprehension: verbalized understanding and returned demonstration   HOME EXERCISE PROGRAM: Access Code: [email or web address] URL:  [email or web address]    Date: 10/08/2023 Prepared by: Alm Kingdom  Exercises - Seated Scapular Retraction  - 1 x daily - 7 x weekly - 2 sets - 10 reps - 3 sec hold    Scapular Retraction with Resistance  - 1 x daily - 7 x weekly - 3 sets - 10 reps - red band hold    Seated Shoulder Horizontal Abduction with Resistance  - 1 x daily - 7 x weekly - 3 sets - 10 reps - red band hold    Plank with Thoracic Rotation on Counter  - 1 x daily - 7 x weekly - 2 sets - 10 reps    Prone Scapular Retraction Y  - 1 x daily - 7 x weekly - 2 sets - 10 reps - Prone I  - 1 x daily - 7 x weekly - 2 sets - 10 reps    ASSESSMENT:   CLINICAL IMPRESSION: Pt performed all exercises without significant increases in pain. Exercise and MT still seems to be providing relief in the short term, talked with pt today about staying consistent with exercises and to try and emphasize strengthening of her periscapular musculature. Performed PA mobs in her thoracic spine that revealed stiffness and her familiar referred pain. Extension based exercises also provoked the same pain indicating potential root nerve involvement. Plan to focus on thoracic mobility and strengthening exercises next session. Pt continues to benefit from Skilled PT to address deficits and facilitate the pt getting back to PLOF.   EVAL: Patient is a 33 y.o. F who was seen today for physical therapy evaluation and treatment for L sided thoracic pain. Pt presentation is consistent with L periscapular weakness. Symptoms began this past April and have been constant ever since.  Pts activities as a mother with young kids as well as posture may be big contributors to the presentation. Concordant symptoms were provoked with ROM  testing, during manual therapy the patient remarked that the pain she feels may be a little bit deeper. Overall, the patient will benefit from skilled physical therapy to address deficits in strengthen and ROM in order to improve participation in daily life.   OBJECTIVE IMPAIRMENTS: decreased mobility, decreased ROM, decreased strength, postural dysfunction, and pain.    ACTIVITY LIMITATIONS: carrying, lifting, bending, transfers, bed mobility, toileting, dressing, and caring for others   PARTICIPATION LIMITATIONS: meal prep, cleaning, laundry, and driving   PERSONAL FACTORS: Fitness and 1 comorbidity: DM 2 are also affecting patient's functional outcome.    REHAB POTENTIAL: Good - clear mechanical provocation   CLINICAL DECISION MAKING: Stable/uncomplicated   EVALUATION COMPLEXITY: Low     GOALS: Goals reviewed with patient? No   SHORT TERM GOALS: Target date: 10/29/2023   Pt will demonstration understanding and performance of HEP to facilitate independence with managing symptoms  Baseline: HEP given at eval  Goal status: MET   2.  Pt will decrease pain to 3/10 with R thoracic rotation to show improved tolerance with daily activities  Baseline: 5/10 8/11: 6/10 Goal status: ONGOING     LONG TERM GOALS: Target date: 12/03/2023   Pt will improve ODI score by MCID to show improvement in functional tolerance  Baseline: 5/50 Goal status: INITIAL   2.  Pt will decrease pain to 1/10 with R thoracic rotation to show improved tolerance with daily activities Baseline: 5/10 Goal status: INITIAL   3.  Pt will improve L and R lower and middle trap MMT's to 4+/5 to enhance scapular control and improve posture during functional tasks  Baseline: 3/5 Goal status: INITIAL                       4. Pt will improve all thoracic AROM to WNL to improve functional abilities with reaching, lifting, and carrying activities                                  Baseline: Extension, side bending - 75% ,  R rotation - 50%                                  Goal status: INITIAL      PLAN:   PT FREQUENCY: 1-2x/week   PT DURATION: 4 weeks   PLANNED INTERVENTIONS: 97110-Therapeutic exercises, 97530- Therapeutic activity, 97112- Neuromuscular re-education, 97535- Self Care, and 02859- Manual therapy.   PLAN FOR NEXT SESSION: Plan to assess response to HEP, continue with periscapular strengthening and thoracic mobility. Assess rhomboid tenderness.  Tamber Burtch, SPT 10/29/23 9:22 AM

## 2023-10-30 ENCOUNTER — Encounter: Payer: Self-pay | Admitting: Family Medicine

## 2023-10-30 ENCOUNTER — Ambulatory Visit (INDEPENDENT_AMBULATORY_CARE_PROVIDER_SITE_OTHER): Admitting: Family Medicine

## 2023-10-30 ENCOUNTER — Encounter: Payer: Self-pay | Admitting: Gastroenterology

## 2023-10-30 ENCOUNTER — Other Ambulatory Visit (HOSPITAL_COMMUNITY): Payer: Self-pay

## 2023-10-30 VITALS — BP 112/83 | HR 79 | Ht 61.0 in | Wt 172.0 lb

## 2023-10-30 DIAGNOSIS — M546 Pain in thoracic spine: Secondary | ICD-10-CM

## 2023-10-30 DIAGNOSIS — E119 Type 2 diabetes mellitus without complications: Secondary | ICD-10-CM | POA: Diagnosis present

## 2023-10-30 DIAGNOSIS — K219 Gastro-esophageal reflux disease without esophagitis: Secondary | ICD-10-CM | POA: Diagnosis not present

## 2023-10-30 DIAGNOSIS — G8929 Other chronic pain: Secondary | ICD-10-CM

## 2023-10-30 LAB — POCT GLYCOSYLATED HEMOGLOBIN (HGB A1C): HbA1c, POC (controlled diabetic range): 6.4 % (ref 0.0–7.0)

## 2023-10-30 MED ORDER — ACCU-CHEK GUIDE TEST VI STRP
ORAL_STRIP | 12 refills | Status: AC
Start: 2023-10-30 — End: ?

## 2023-10-30 NOTE — Assessment & Plan Note (Signed)
 Ongoing symptoms despite pantoprazole  and famotidine .  GI referral processed, provided scheduling information today.  Continue on current therapy.  Prior repeat urea breath test negative, patient requesting repeat testing which is reasonable given possible root exposure. -Urea breath test

## 2023-10-30 NOTE — Patient Instructions (Addendum)
 It was wonderful to see you today! Thank you for choosing [facility].   Please bring ALL of your medications with you to every visit.   Today we talked about:  You have mild repeat your urea breath test today given the exposed apartment I do think ultimately the GI doctor to have an endoscopy so they can look at your stomach and see if there are other causes of your symptoms. I have ordered x-rays to be done of your thoracic spine and ribs given your ongoing pain.  You can have these done at [address].  Please continue to see physical therapy and continue your home therapy exercises.  If your pain does continue we may consider getting an MRI of your thoracic spine to see if there are other causes of your symptoms. Please call the GI doctor using the information below to schedule appointment with the specialist.  [facility]  [address] [place] [address], [place], [place] [address]  Phone: [phone]   Please follow up in 3 months or sooner if needed   We are checking some labs today. If they are abnormal, I will call you. If they are normal, I will send you a MyChart message (if it is active) or a letter in the mail. If you do not hear about your labs in the next 2 weeks, please call the office.  Call the clinic at [phone] if your symptoms worsen or you have any concerns.  Please be sure to schedule follow up at the front desk before you leave today.   Izetta Nap, DO Family Medicine

## 2023-10-30 NOTE — Assessment & Plan Note (Signed)
 Some improvement but continues to have discomfort despite PT x 4 sessions and OMT.  Possible component of thoracic nerve impingement, will obtain x-ray imaging and if continues to fail conservative therapy will pursue MRI. -Thoracic and left rib XR -Continue supportive therapies, PT and OMT as scheduled

## 2023-10-30 NOTE — Progress Notes (Signed)
    SUBJECTIVE:   CHIEF COMPLAINT / HPI:   Left-sided chest pain On pantoprazole  40 mg daily and referred to GI previously.  Given MSK component, has received PT x 4 treatments and OMT.  Feels like her symptoms have improved some but still continues to have daily pain that is bothersome.  Diabetes Current Regimen: None CBGs: Out of strips  Last A1c:  Lab Results  Component Value Date   HGBA1C 6.4 10/30/2023    Denies polyuria, polydipsia, hypoglycemia. Last Eye Exam: Due Statin: Rosuvastatin  10 mg daily ACE/ARB: None Receiving Depo reliably for contraception.  PERTINENT  PMH / PSH: T2DM, GERD  OBJECTIVE:   BP 112/83   Pulse 79   Ht 5' 1 (1.549 m)   Wt 172 lb (78 kg)   SpO2 100%   BMI 32.50 kg/m    General: NAD, pleasant, able to participate in exam Cardiac: RRR, no murmurs. Respiratory: CTAB, normal effort, No wheezes, rales or rhonchi Abdomen: Bowel sounds present, nontender, nondistended MSK: TTP over left thoracic paraspinal musculature with some tenderness extending over ribs 5-8 to lateral and anterior surfaces.  No erythema or deformity noted.  Full ROM thoracic spine. Skin: warm and dry, no rashes noted Neuro: alert, no obvious focal deficits Psych: Normal affect and mood  ASSESSMENT/PLAN:   Assessment & Plan Type 2 diabetes mellitus without complication, without long-term current use of insulin (HCC) A1c 6.4, increased from 5.6 in 06/2023.  Not currently on treatment given ongoing GERD concerns as below, will continue with lifestyle management repeat A1c in 3 months. -Refill glucometer test strips Gastroesophageal reflux disease, unspecified whether esophagitis present Ongoing symptoms despite pantoprazole  and famotidine .  GI referral processed, provided scheduling information today.  Continue on current therapy.  Prior repeat urea breath test negative, patient requesting repeat testing which is reasonable given possible root exposure. -Urea breath  test Chronic left-sided thoracic back pain Some improvement but continues to have discomfort despite PT x 4 sessions and OMT.  Possible component of thoracic nerve impingement, will obtain x-ray imaging and if continues to fail conservative therapy will pursue MRI. -Thoracic and left rib XR -Continue supportive therapies, PT and OMT as scheduled    Dr. Izetta Nap, DO [facility]

## 2023-10-30 NOTE — Assessment & Plan Note (Signed)
 A1c 6.4, increased from 5.6 in 06/2023.  Not currently on treatment given ongoing GERD concerns as below, will continue with lifestyle management repeat A1c in 3 months. -Refill glucometer test strips

## 2023-10-31 LAB — H. PYLORI BREATH TEST: H pylori Breath Test: NEGATIVE

## 2023-11-01 ENCOUNTER — Other Ambulatory Visit: Payer: Self-pay | Admitting: Family Medicine

## 2023-11-01 ENCOUNTER — Ambulatory Visit
Admission: RE | Admit: 2023-11-01 | Discharge: 2023-11-01 | Disposition: A | Discharge status: Home / Self Care | Source: Ambulatory Visit | Attending: Family Medicine | Admitting: Family Medicine

## 2023-11-01 ENCOUNTER — Ambulatory Visit: Payer: Self-pay | Admitting: Family Medicine

## 2023-11-01 DIAGNOSIS — E119 Type 2 diabetes mellitus without complications: Secondary | ICD-10-CM

## 2023-11-01 DIAGNOSIS — G8929 Other chronic pain: Secondary | ICD-10-CM

## 2023-11-01 DIAGNOSIS — M546 Pain in thoracic spine: Secondary | ICD-10-CM | POA: Diagnosis not present

## 2023-11-01 DIAGNOSIS — K219 Gastro-esophageal reflux disease without esophagitis: Secondary | ICD-10-CM

## 2023-11-01 DIAGNOSIS — R0781 Pleurodynia: Secondary | ICD-10-CM | POA: Diagnosis not present

## 2023-11-02 ENCOUNTER — Ambulatory Visit

## 2023-11-02 DIAGNOSIS — M546 Pain in thoracic spine: Secondary | ICD-10-CM

## 2023-11-02 DIAGNOSIS — R293 Abnormal posture: Secondary | ICD-10-CM

## 2023-11-02 DIAGNOSIS — M6281 Muscle weakness (generalized): Secondary | ICD-10-CM

## 2023-11-02 NOTE — Therapy (Signed)
 OUTPATIENT PHYSICAL THERAPY TREATMENT     Patient Name: Paula Stark MRN: [id number] DOB:[birth year], 33 y.o., female Today's Date: 10/08/2023   END OF SESSION:  PT End of Session - 11/02/23 0929     Visit Number 5    Number of Visits 17    Date for PT Re-Evaluation 12/10/23    PT Start Time 0930    PT Stop Time 1015    PT Time Calculation (min) 45 min    Activity Tolerance Patient tolerated treatment well    Behavior During Therapy WFL for tasks assessed/performed               PCP: Theophilus Pagan, MD   REFERRING PROVIDER: Donzetta Rollene BRAVO, MD   REFERRING DIAG: [phone] (ICD-10-CM) - Chronic left-sided thoracic back pain    Rationale for Evaluation and Treatment: Rehabilitation   THERAPY DIAG:  Pain in thoracic spine  Abnormal posture  Muscle weakness (generalized)    ONSET DATE: April, 2025   SUBJECTIVE:                                                                                                                                                                                            SUBJECTIVE STATEMENT: Pt met with Dr. On the 12th and she was referred for an X ray of her thoracic spine and chest which she got yesterday, has not received results. Pain 3-5/10 today.  EVAL: Pt presents to PT today, referred for left thoracic pain that radiates laterally. Pt states her pain began without a clear onset, woke up one morning in April with pain in her thoracic spine that has not gone away. Pt reports the pain is a deep ache that burns sometimes. Pain is pretty constant and does not get better or worse. 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 The pt reports no emotional or mental burden from her pain.   PERTINENT HISTORY:  DM 2   PAIN:  Are you having pain? Yes: NPRS scale: 5/10 Pain location: L thoracic spine between scapula and spine Pain description: Burning, shooting, deep Aggravating factors: Right thoracic rotation Relieving  factors: none specified   PRECAUTIONS: None   RED FLAGS: None      WEIGHT BEARING RESTRICTIONS: No   FALLS:  Has patient fallen in last 6 months? No   LIVING ENVIRONMENT: Lives with: lives with their family Lives in: House/apartment Has following equipment at home: None   OCCUPATION: Stay at home Mom   PLOF: Independent   PATIENT GOALS: Reduce thoracic pain, pain free with ADLs   NEXT MD  VISIT: 10/30/2023   OBJECTIVE:  Note: Objective measures were completed at Evaluation unless otherwise noted.   DIAGNOSTIC FINDINGS:  N/a   PATIENT SURVEYS:  ODI: 5/50   COGNITION: Overall cognitive status: Within functional limits for tasks assessed                          SENSATION: Not tested   MUSCLE LENGTH:     POSTURE: rounded shoulders, forward head, and increased thoracic kyphosis   PALPATION: TTP - rhomboids   THORACIC ROM:    AROM eval  Flexion WNL  Extension 75%  Right lateral flexion 75%  Left lateral flexion 75%  Right rotation 50%  Left rotation WNL   (Blank rows = not tested)   UPPER EXTREMITY ROM:   Active ROM Right eval Left eval  Shoulder flexion WNL WNL  Shoulder extension      Shoulder abduction WNL WNL  Shoulder adduction      Shoulder extension      Shoulder internal rotation      Shoulder external rotation      Elbow flexion      Elbow extension      Wrist flexion      Wrist extension      Wrist ulnar deviation      Wrist radial deviation      Wrist pronation      Wrist supination       (Blank rows = not tested) UPPER EXTREMITY MMT:   MMT Right eval Left eval  Shoulder flexion      Shoulder extension      Shoulder abduction      Shoulder adduction      Shoulder extension      Shoulder internal rotation      Shoulder external rotation 4+ 4  Middle trapezius 3 3  Lower trapezius 3 3  Elbow flexion      Elbow extension      Wrist flexion      Wrist extension      Wrist ulnar deviation      Wrist radial deviation       Wrist pronation      Wrist supination      Grip strength       (Blank rows = not tested)   FUNCTIONAL TESTS:      GAIT: Distance walked: 70ft Assistive device utilized: None Level of assistance: Complete Independence Comments: slight lateral sway   TREATMENT: [facility] Adult PT Treatment:                                              DATE: 11/02/2023 Therapeutic Exercise UBE lvl 1.0 x 3 min for functional activity tolerance Prone SA Y's SL open books x 10 ea Foam roller rollouts up wall for thoracic extension 2x10 2 hold Cat Cow x15 Supine rtb pull apart, towel rolled under t spine x 10 Ytb SA horizontal abduction 2x10 FM row 3x10 15#  Manual Therapy: Grd 3/4 mobs at T4-T6 - pt stated this was the right spot  [facility] Adult PT Treatment:  DATE: 10/30/2023 UBE lvl 1.0 x 3 min for functional activity tolerance Quadruped Y's x10 Foam roller rollouts up wall for thoracic extension 2x10 2 hold Cat Cow x15 Supine rtb pull apart, towel rolled under t spine  Manual Therapy: STM to L rhomboids Grd 3 mobs at upper,mid T spine - elicited pain wrapping around spine  [facility] Adult PT Treatment:                                                DATE: 10/19/2023 UBE lvl 1.0 x 3 min for functional activity tolerance S/L open books x 10 ea Supine horizontal abd 2x10 RTB Prone Y's 3x8 Seated bilateral ER 3x15 RTB towel under elbow Standing rtb T's 2x15 Standing rtb Y's Foam roller rollouts up wall for thoracic extension 2x10 2 hold FM row 2x8 10# 15# Manual Therapy: STM to L rhomboids   [facility] Adult PT Treatment:                                                DATE: 10/15/2023 UBE lvl 1.0 x 3 min for functional activity tolerance S/L open books x 10 ea Supine horizontal abd 2x10 RTB Supine dow flexion x 15 Seated bilateral ER 2x10 RTB Qped thread the needle with soft foam x 10 ea Hooklying thoracic ext over soft foam x 10 FM row 2x10 13# Manual  Therapy: STM to L rhomboids IASTM to L rhomboids and paraspinals Grade III PA mobs to T1-4  [facility] Adult PT Treatment:                                                DATE: 10/08/2023 Therapeutic Exercise: RTB seated row  RTB band pull aparts  Needle threads against wall Prone I's and Y's Manual Therapy: STM to L rhomboids   PATIENT EDUCATION:  Education details: Provided education on evaluation findings, expectations, and performance of HEP Person educated: Patient Education method: Explanation, Demonstration, and Handouts Education comprehension: verbalized understanding and returned demonstration   HOME EXERCISE PROGRAM: Access Code: [email or web address] URL:  [email or web address]    Date: 10/08/2023 Prepared by: Alm Kingdom  Exercises - Seated Scapular Retraction  - 1 x daily - 7 x weekly - 2 sets - 10 reps - 3 sec hold    Scapular Retraction with Resistance  - 1 x daily - 7 x weekly - 3 sets - 10 reps - red band hold    Seated Shoulder Horizontal Abduction with Resistance  - 1 x daily - 7 x weekly - 3 sets - 10 reps - red band hold    Plank with Thoracic Rotation on Counter  - 1 x daily - 7 x weekly - 2 sets - 10 reps    Prone Scapular Retraction Y  - 1 x daily - 7 x weekly - 2 sets - 10 reps - Prone I  - 1 x daily - 7 x weekly - 2 sets - 10 reps    ASSESSMENT:   CLINICAL IMPRESSION: Pt reported to clinic in some pain today, still getting pain that wraps around from thoracic spine  into chest area. Manual therapy is still able to elicit her symptoms, she reports feeling some reduction after this. Thoracic mobility exercises continued, extended positions still causing referred symptoms. Periscapular strength is slowly progressing, emphasized with the pt to stay consistent with HEP. The pt benefits from skilled PT to address deficits in strength and mobility to improve participation in meaningful activities.   EVAL: Patient is a 33 y.o. F who was seen today for physical  therapy evaluation and treatment for L sided thoracic pain. Pt presentation is consistent with L periscapular weakness. Symptoms began this past April and have been constant ever since. Pts activities as a mother with young kids as well as posture may be big contributors to the presentation. Concordant symptoms were provoked with ROM testing, during manual therapy the patient remarked that the pain she feels may be a little bit deeper. Overall, the patient will benefit from skilled physical therapy to address deficits in strengthen and ROM in order to improve participation in daily life.   OBJECTIVE IMPAIRMENTS: decreased mobility, decreased ROM, decreased strength, postural dysfunction, and pain.    ACTIVITY LIMITATIONS: carrying, lifting, bending, transfers, bed mobility, toileting, dressing, and caring for others   PARTICIPATION LIMITATIONS: meal prep, cleaning, laundry, and driving   PERSONAL FACTORS: Fitness and 1 comorbidity: DM 2 are also affecting patient's functional outcome.    REHAB POTENTIAL: Good - clear mechanical provocation   CLINICAL DECISION MAKING: Stable/uncomplicated   EVALUATION COMPLEXITY: Low     GOALS: Goals reviewed with patient? Yes   SHORT TERM GOALS: Target date: 10/29/2023   Pt will demonstration understanding and performance of HEP to facilitate independence with managing symptoms  Baseline: HEP given at eval  Goal status: MET   2.  Pt will decrease pain to 3/10 with R thoracic rotation to show improved tolerance with daily activities  Baseline: 5/10 8/11: 6/10 Goal status: ONGOING     LONG TERM GOALS: Target date: 12/03/2023   Pt will improve ODI score by MCID to show improvement in functional tolerance  Baseline: 5/50 Goal status: INITIAL   2.  Pt will decrease pain to 1/10 with R thoracic rotation to show improved tolerance with daily activities Baseline: 5/10 Goal status: INITIAL   3.  Pt will improve L and R lower and middle trap MMT's to  4+/5 to enhance scapular control and improve posture during functional tasks  Baseline: 3/5 Goal status: INITIAL                       4. Pt will improve all thoracic AROM to WNL to improve functional abilities with reaching, lifting, and carrying activities                                  Baseline: Extension, side bending - 75% , R rotation - 50%                                  Goal status: INITIAL      PLAN:   PT FREQUENCY: 1-2x/week   PT DURATION: 4 weeks   PLANNED INTERVENTIONS: 97110-Therapeutic exercises, 97530- Therapeutic activity, 97112- Neuromuscular re-education, 97535- Self Care, and 02859- Manual therapy.   PLAN FOR NEXT SESSION: Plan to assess response to HEP, continue with periscapular strengthening and thoracic mobility. Assess rhomboid tenderness.  Tatym Schermer, [place] 11/02/23 10:18  AM

## 2023-11-05 ENCOUNTER — Ambulatory Visit

## 2023-11-05 DIAGNOSIS — M546 Pain in thoracic spine: Secondary | ICD-10-CM

## 2023-11-05 DIAGNOSIS — R293 Abnormal posture: Secondary | ICD-10-CM

## 2023-11-05 DIAGNOSIS — M6281 Muscle weakness (generalized): Secondary | ICD-10-CM

## 2023-11-05 NOTE — Therapy (Signed)
 OUTPATIENT PHYSICAL THERAPY TREATMENT     Patient Name: Paula Stark MRN: [id number] DOB:1990/03/22, 33 y.o., female Today's Date: 10/08/2023   END OF SESSION:  PT End of Session - 11/05/23 0933     Visit Number 6    Number of Visits 17    Date for PT Re-Evaluation 12/10/23    PT Start Time 0933    PT Stop Time 1011    PT Time Calculation (min) 38 min    Activity Tolerance Patient tolerated treatment well    Behavior During Therapy WFL for tasks assessed/performed                PCP: Theophilus Pagan, MD   REFERRING PROVIDER: Donzetta Rollene BRAVO, MD   REFERRING DIAG: [phone] (ICD-10-CM) - Chronic left-sided thoracic back pain    Rationale for Evaluation and Treatment: Rehabilitation   THERAPY DIAG:  Pain in thoracic spine  Abnormal posture  Muscle weakness (generalized)    ONSET DATE: April, 2025   SUBJECTIVE:                                                                                                                                                                                            SUBJECTIVE STATEMENT: Pt presents to PT with reports of mid back stiffness and pain at 4/10. Has not felt too much change since starting therapy. Has been compliant with HEP.   EVAL: Pt presents to PT today, referred for left thoracic pain that radiates laterally. Pt states her pain began without a clear onset, woke up one morning in April with pain in her thoracic spine that has not gone away. Pt reports the pain is a deep ache that burns sometimes. Pain is pretty constant and does not get better or worse. 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 The pt reports no emotional or mental burden from her pain.   PERTINENT HISTORY:  DM 2   PAIN:  Are you having pain?  Yes: NPRS scale: 5/10 Pain location: L thoracic spine between scapula and spine Pain description: Burning, shooting, deep Aggravating factors: Right thoracic rotation Relieving factors:  none specified   PRECAUTIONS: None   RED FLAGS: None      WEIGHT BEARING RESTRICTIONS: No   FALLS:  Has patient fallen in last 6 months? No   LIVING ENVIRONMENT: Lives with: lives with their family Lives in: House/apartment Has following equipment at home: None   OCCUPATION: Stay at home Mom   PLOF: Independent   PATIENT GOALS: Reduce thoracic pain, pain free with ADLs   NEXT MD VISIT:  10/30/2023   OBJECTIVE:  Note: Objective measures were completed at Evaluation unless otherwise noted.   DIAGNOSTIC FINDINGS:  N/a   PATIENT SURVEYS:  ODI: 5/50   COGNITION: Overall cognitive status: Within functional limits for tasks assessed                          SENSATION: Not tested   MUSCLE LENGTH:     POSTURE: rounded shoulders, forward head, and increased thoracic kyphosis   PALPATION: TTP - rhomboids   THORACIC ROM:    AROM eval  Flexion WNL  Extension 75%  Right lateral flexion 75%  Left lateral flexion 75%  Right rotation 50%  Left rotation WNL   (Blank rows = not tested)   UPPER EXTREMITY ROM:   Active ROM Right eval Left eval  Shoulder flexion WNL WNL  Shoulder extension      Shoulder abduction WNL WNL  Shoulder adduction      Shoulder extension      Shoulder internal rotation      Shoulder external rotation      Elbow flexion      Elbow extension      Wrist flexion      Wrist extension      Wrist ulnar deviation      Wrist radial deviation      Wrist pronation      Wrist supination       (Blank rows = not tested) UPPER EXTREMITY MMT:   MMT Right eval Left eval  Shoulder flexion      Shoulder extension      Shoulder abduction      Shoulder adduction      Shoulder extension      Shoulder internal rotation      Shoulder external rotation 4+ 4  Middle trapezius 3 3  Lower trapezius 3 3  Elbow flexion      Elbow extension      Wrist flexion      Wrist extension      Wrist ulnar deviation      Wrist radial deviation      Wrist  pronation      Wrist supination      Grip strength       (Blank rows = not tested)   FUNCTIONAL TESTS:      GAIT: Distance walked: 20ft Assistive device utilized: None Level of assistance: Complete Independence Comments: slight lateral sway   TREATMENT: [facility] Adult PT Treatment:                                              DATE: 11/05/2023 Supine horizontal abd 2x15 RTB S/L open book x 10 ea Cat Cow x 10 Prone Y/T 2x10  Seated bilateral ER 2x15 RTB Standing row 2x10 blue band Foam roller thoracic mobility routine  Horizontal abd/add x 15 Protraction/retraction x 15 Alt flex/ext x 15 Seated thoracic ext over foam x 15 Manual Therapy: Grade III PA mobs to T4-6 Grade V prone thrust manipulation to thoracic spine with cavitation  [facility] Adult PT Treatment:                                              DATE:  11/02/2023 Therapeutic Exercise UBE lvl 1.0 x 3 min for functional activity tolerance Prone SA Y's SL open books x 10 ea Foam roller rollouts up wall for thoracic extension 2x10 2 hold Cat Cow x15 Supine rtb pull apart, towel rolled under t spine x 10 Ytb SA horizontal abduction 2x10 FM row 3x10 15# Manual Therapy: Grd 3/4 mobs at T4-T6 - pt stated this was the right spot  [facility] Adult PT Treatment:                                              DATE: 10/30/2023 UBE lvl 1.0 x 3 min for functional activity tolerance Quadruped Y's x10 Foam roller rollouts up wall for thoracic extension 2x10 2 hold Cat Cow x15 Supine rtb pull apart, towel rolled under t spine Manual Therapy: STM to L rhomboids Grd 3 mobs at upper,mid T spine - elicited pain wrapping around spine  [facility] Adult PT Treatment:                                                DATE: 10/19/2023 UBE lvl 1.0 x 3 min for functional activity tolerance S/L open books x 10 ea Supine horizontal abd 2x10 RTB Prone Y's 3x8 Seated bilateral ER 3x15 RTB towel under elbow Standing rtb T's 2x15 Standing rtb Y's Foam roller  rollouts up wall for thoracic extension 2x10 2 hold FM row 2x8 10# 15# Manual Therapy: STM to L rhomboids   [facility] Adult PT Treatment:                                                DATE: 10/15/2023 UBE lvl 1.0 x 3 min for functional activity tolerance S/L open books x 10 ea Supine horizontal abd 2x10 RTB Supine dow flexion x 15 Seated bilateral ER 2x10 RTB Qped thread the needle with soft foam x 10 ea Hooklying thoracic ext over soft foam x 10 FM row 2x10 13# Manual Therapy: STM to L rhomboids IASTM to L rhomboids and paraspinals Grade III PA mobs to T1-4  [facility] Adult PT Treatment:                                                DATE: 10/08/2023 Therapeutic Exercise: RTB seated row  RTB band pull aparts  Needle threads against wall Prone I's and Y's Manual Therapy: STM to L rhomboids   PATIENT EDUCATION:  Education details: Provided education on evaluation findings, expectations, and performance of HEP Person educated: Patient Education method: Explanation, Demonstration, and Handouts Education comprehension: verbalized understanding and returned demonstration   HOME EXERCISE PROGRAM: Access Code: [email or web address] URL:  [email or web address]    Date: 10/08/2023 Prepared by: Alm Kingdom  Exercises - Seated Scapular Retraction  - 1 x daily - 7 x weekly - 2 sets - 10 reps - 3 sec hold    Scapular Retraction with Resistance  - 1 x daily - 7 x weekly - 3 sets - 10 reps -  red band hold    Seated Shoulder Horizontal Abduction with Resistance  - 1 x daily - 7 x weekly - 3 sets - 10 reps - red band hold    Plank with Thoracic Rotation on Counter  - 1 x daily - 7 x weekly - 2 sets - 10 reps    Prone Scapular Retraction Y  - 1 x daily - 7 x weekly - 2 sets - 10 reps - Prone I  - 1 x daily - 7 x weekly - 2 sets - 10 reps    ASSESSMENT:   CLINICAL IMPRESSION: Pt was able to complete all prescribed exercises and noted decreased pain post session. Responded well to manual  therapy interventions and started prone grade V manipulation greatly decreased pain. Exercises today continued to focus on thoracic mobility and periscapular strengthening as well as motor control and posture. Continues to benefit from skilled PT services, will continue to progress as able per POC.   EVAL: Patient is a 33 y.o. F who was seen today for physical therapy evaluation and treatment for L sided thoracic pain. Pt presentation is consistent with L periscapular weakness. Symptoms began this past April and have been constant ever since. Pts activities as a mother with young kids as well as posture may be big contributors to the presentation. Concordant symptoms were provoked with ROM testing, during manual therapy the patient remarked that the pain she feels may be a little bit deeper. Overall, the patient will benefit from skilled physical therapy to address deficits in strengthen and ROM in order to improve participation in daily life.   OBJECTIVE IMPAIRMENTS: decreased mobility, decreased ROM, decreased strength, postural dysfunction, and pain.    ACTIVITY LIMITATIONS: carrying, lifting, bending, transfers, bed mobility, toileting, dressing, and caring for others   PARTICIPATION LIMITATIONS: meal prep, cleaning, laundry, and driving   PERSONAL FACTORS: Fitness and 1 comorbidity: DM 2 are also affecting patient's functional outcome.    REHAB POTENTIAL: Good - clear mechanical provocation   CLINICAL DECISION MAKING: Stable/uncomplicated   EVALUATION COMPLEXITY: Low     GOALS: Goals reviewed with patient? Yes   SHORT TERM GOALS: Target date: 10/29/2023   Pt will demonstration understanding and performance of HEP to facilitate independence with managing symptoms  Baseline: HEP given at eval  Goal status: MET   2.  Pt will decrease pain to 3/10 with R thoracic rotation to show improved tolerance with daily activities  Baseline: 5/10 8/11: 6/10 Goal status: ONGOING     LONG TERM  GOALS: Target date: 12/03/2023   Pt will improve ODI score by MCID to show improvement in functional tolerance  Baseline: 5/50 Goal status: INITIAL   2.  Pt will decrease pain to 1/10 with R thoracic rotation to show improved tolerance with daily activities Baseline: 5/10 Goal status: INITIAL   3.  Pt will improve L and R lower and middle trap MMT's to 4+/5 to enhance scapular control and improve posture during functional tasks  Baseline: 3/5 Goal status: INITIAL                       4. Pt will improve all thoracic AROM to WNL to improve functional abilities with reaching, lifting, and carrying activities                                  Baseline: Extension, side bending - 75% , R  rotation - 50%                                  Goal status: INITIAL      PLAN:   PT FREQUENCY: 1-2x/week   PT DURATION: 4 weeks   PLANNED INTERVENTIONS: 97110-Therapeutic exercises, 97530- Therapeutic activity, [id number]- Neuromuscular re-education, 97535- Self Care, and 02859- Manual therapy.   PLAN FOR NEXT SESSION: Plan to assess response to HEP, continue with periscapular strengthening and thoracic mobility. Assess rhomboid tenderness.  Alm JAYSON Kingdom PT  11/05/23 10:21 AM

## 2023-11-09 ENCOUNTER — Ambulatory Visit

## 2023-11-09 DIAGNOSIS — M546 Pain in thoracic spine: Secondary | ICD-10-CM | POA: Diagnosis not present

## 2023-11-09 DIAGNOSIS — M6281 Muscle weakness (generalized): Secondary | ICD-10-CM

## 2023-11-09 DIAGNOSIS — R293 Abnormal posture: Secondary | ICD-10-CM

## 2023-11-09 NOTE — Therapy (Signed)
 OUTPATIENT PHYSICAL THERAPY TREATMENT     Patient Name: Paula Stark MRN: [id number] DOB:1990-09-20, 33 y.o., female Today's Date: 10/08/2023   END OF SESSION:  PT End of Session - 11/09/23 0931     Visit Number 7    Number of Visits 17    Date for PT Re-Evaluation 12/10/23    PT Start Time 0931    PT Stop Time 1014    PT Time Calculation (min) 43 min    Activity Tolerance Patient tolerated treatment well    Behavior During Therapy WFL for tasks assessed/performed                 PCP: Theophilus Pagan, MD   REFERRING PROVIDER: Donzetta Rollene BRAVO, MD   REFERRING DIAG: [phone] (ICD-10-CM) - Chronic left-sided thoracic back pain    Rationale for Evaluation and Treatment: Rehabilitation   THERAPY DIAG:  Pain in thoracic spine  Abnormal posture  Muscle weakness (generalized)    ONSET DATE: April, 2025   SUBJECTIVE:                                                                                                                                                                                            SUBJECTIVE STATEMENT: Patient states she is still in pain and feels the same, did not perform HEP yesterday but has been consistent. 5/10 pain today.  EVAL: Pt presents to PT today, referred for left thoracic pain that radiates laterally. Pt states her pain began without a clear onset, woke up one morning in April with pain in her thoracic spine that has not gone away. Pt reports the pain is a deep ache that burns sometimes. Pain is pretty constant and does not get better or worse. 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 The pt reports no emotional or mental burden from her pain.   PERTINENT HISTORY:  DM 2   PAIN:  Are you having pain?  Yes: NPRS scale: 5/10 Pain location: L thoracic spine between scapula and spine Pain description: Burning, shooting, deep Aggravating factors: Right thoracic rotation Relieving factors: none specified    PRECAUTIONS: None   RED FLAGS: None      WEIGHT BEARING RESTRICTIONS: No   FALLS:  Has patient fallen in last 6 months? No   LIVING ENVIRONMENT: Lives with: lives with their family Lives in: House/apartment Has following equipment at home: None   OCCUPATION: Stay at home Mom   PLOF: Independent   PATIENT GOALS: Reduce thoracic pain, pain free with ADLs   NEXT MD VISIT: 10/30/2023   OBJECTIVE:  Note: Objective measures were completed at Evaluation unless otherwise noted.   DIAGNOSTIC FINDINGS:  N/a   PATIENT SURVEYS:  ODI: 5/50   COGNITION: Overall cognitive status: Within functional limits for tasks assessed                          SENSATION: Not tested   MUSCLE LENGTH:     POSTURE: rounded shoulders, forward head, and increased thoracic kyphosis   PALPATION: TTP - rhomboids   THORACIC ROM:    AROM eval  Flexion WNL  Extension 75%  Right lateral flexion 75%  Left lateral flexion 75%  Right rotation 50%  Left rotation WNL   (Blank rows = not tested)   UPPER EXTREMITY ROM:   Active ROM Right eval Left eval  Shoulder flexion WNL WNL  Shoulder extension      Shoulder abduction WNL WNL  Shoulder adduction      Shoulder extension      Shoulder internal rotation      Shoulder external rotation      Elbow flexion      Elbow extension      Wrist flexion      Wrist extension      Wrist ulnar deviation      Wrist radial deviation      Wrist pronation      Wrist supination       (Blank rows = not tested) UPPER EXTREMITY MMT:   MMT Right eval Left eval  Shoulder flexion      Shoulder extension      Shoulder abduction      Shoulder adduction      Shoulder extension      Shoulder internal rotation      Shoulder external rotation 4+ 4  Middle trapezius 3 3  Lower trapezius 3 3  Elbow flexion      Elbow extension      Wrist flexion      Wrist extension      Wrist ulnar deviation      Wrist radial deviation      Wrist pronation       Wrist supination      Grip strength       (Blank rows = not tested)   FUNCTIONAL TESTS:      GAIT: Distance walked: 94ft Assistive device utilized: None Level of assistance: Complete Independence Comments: slight lateral sway   TREATMENT: [facility] Adult PT Treatment:                                              DATE: 11/09/2023 Therapeutic Exercise UBE lvl 1.0 x 3 min for functional activity tolerance Prone SA Y's 2# 2x10 SL open books x 15 ea Foam roller rollouts up wall for thoracic extension x10 2 hold Cat Cow x15 Gtb horizontal abduction x10 rtb x 10 FM row 3x10 15# 25# 35#  Manual Therapy: Grd 3/4 mobs at T4-T6 Grade V prone thrust manipulation to thoracic spine with cavitation  [facility] Adult PT Treatment:                                              DATE: 11/05/2023 Supine horizontal abd 2x15 RTB S/L open  book x 10 ea Cat Cow x 10 Prone Y/T 2x10  Seated bilateral ER 2x15 RTB Standing row 2x10 blue band Foam roller thoracic mobility routine  Horizontal abd/add x 15 Protraction/retraction x 15 Alt flex/ext x 15 Seated thoracic ext over foam x 15 Manual Therapy: Grade III PA mobs to T4-6 Grade V prone thrust manipulation to thoracic spine with cavitation  [facility] Adult PT Treatment:                                              DATE: 11/02/2023 Therapeutic Exercise UBE lvl 1.0 x 3 min for functional activity tolerance Prone SA Y's 3x10 L SL open books x 10 ea Foam roller rollouts up wall for thoracic extension 2x10 2 hold Cat Cow x15 Supine rtb pull apart, towel rolled under t spine x 10 Ytb SA horizontal abduction 2x10 FM row 3x10 15# Manual Therapy: Grd 3/4 mobs at T4-T6 - pt stated this was the right spot  [facility] Adult PT Treatment:                                              DATE: 10/30/2023 UBE lvl 1.0 x 3 min for functional activity tolerance Quadruped Y's x10 Foam roller rollouts up wall for thoracic extension 2x10 2 hold Cat Cow x15 Supine rtb pull apart,  towel rolled under t spine Manual Therapy: STM to L rhomboids Grd 3 mobs at upper,mid T spine - elicited pain wrapping around spine  [facility] Adult PT Treatment:                                                DATE: 10/19/2023 UBE lvl 1.0 x 3 min for functional activity tolerance S/L open books x 10 ea Supine horizontal abd 2x10 RTB Prone Y's 3x8 Seated bilateral ER 3x15 RTB towel under elbow Standing rtb T's 2x15 Standing rtb Y's Foam roller rollouts up wall for thoracic extension 2x10 2 hold FM row 2x8 10# 15# Manual Therapy: STM to L rhomboids   [facility] Adult PT Treatment:                                                DATE: 10/15/2023 UBE lvl 1.0 x 3 min for functional activity tolerance S/L open books x 10 ea Supine horizontal abd 2x10 RTB Supine dow flexion x 15 Seated bilateral ER 2x10 RTB Qped thread the needle with soft foam x 10 ea Hooklying thoracic ext over soft foam x 10 FM row 2x10 13# Manual Therapy: STM to L rhomboids IASTM to L rhomboids and paraspinals Grade III PA mobs to T1-4  [facility] Adult PT Treatment:                                                DATE: 10/08/2023 Therapeutic Exercise: RTB seated row  RTB band pull  aparts  Needle threads against wall Prone I's and Y's Manual Therapy: STM to L rhomboids   PATIENT EDUCATION:  Education details: Provided education on evaluation findings, expectations, and performance of HEP Person educated: Patient Education method: Explanation, Demonstration, and Handouts Education comprehension: verbalized understanding and returned demonstration   HOME EXERCISE PROGRAM: Access Code: [email or web address] URL:  [email or web address]    Date: 10/08/2023 Prepared by: Alm Kingdom  Exercises - Seated Scapular Retraction  - 1 x daily - 7 x weekly - 2 sets - 10 reps - 3 sec hold    Scapular Retraction with Resistance  - 1 x daily - 7 x weekly - 3 sets - 10 reps - red band hold    Seated Shoulder Horizontal Abduction with  Resistance  - 1 x daily - 7 x weekly - 3 sets - 10 reps - red band hold    Plank with Thoracic Rotation on Counter  - 1 x daily - 7 x weekly - 2 sets - 10 reps    Prone Scapular Retraction Y  - 1 x daily - 7 x weekly - 2 sets - 10 reps - Prone I  - 1 x daily - 7 x weekly - 2 sets - 10 reps    ASSESSMENT:   CLINICAL IMPRESSION: Pt was able to complete all prescribed exercises and noted decreased in muscle tightness post session. Exercises today continued to focus on thoracic mobility and periscapular strengthening as well as motor control and posture. The pt still endorses a pain that wraps from thoracic spine around to her chest with thoracic extension that has not changed since starting therapy. Continues to benefit from skilled PT services, will continue to progress as able per POC.   EVAL: Patient is a 33 y.o. F who was seen today for physical therapy evaluation and treatment for L sided thoracic pain. Pt presentation is consistent with L periscapular weakness. Symptoms began this past April and have been constant ever since. Pts activities as a mother with young kids as well as posture may be big contributors to the presentation. Concordant symptoms were provoked with ROM testing, during manual therapy the patient remarked that the pain she feels may be a little bit deeper. Overall, the patient will benefit from skilled physical therapy to address deficits in strengthen and ROM in order to improve participation in daily life.   OBJECTIVE IMPAIRMENTS: decreased mobility, decreased ROM, decreased strength, postural dysfunction, and pain.    ACTIVITY LIMITATIONS: carrying, lifting, bending, transfers, bed mobility, toileting, dressing, and caring for others   PARTICIPATION LIMITATIONS: meal prep, cleaning, laundry, and driving   PERSONAL FACTORS: Fitness and 1 comorbidity: DM 2 are also affecting patient's functional outcome.    REHAB POTENTIAL: Good - clear mechanical provocation   CLINICAL  DECISION MAKING: Stable/uncomplicated   EVALUATION COMPLEXITY: Low     GOALS: Goals reviewed with patient? Yes   SHORT TERM GOALS: Target date: 10/29/2023   Pt will demonstration understanding and performance of HEP to facilitate independence with managing symptoms  Baseline: HEP given at eval  Goal status: MET   2.  Pt will decrease pain to 3/10 with R thoracic rotation to show improved tolerance with daily activities  Baseline: 5/10 8/11: 6/10 Goal status: ONGOING     LONG TERM GOALS: Target date: 12/03/2023   Pt will improve ODI score by MCID to show improvement in functional tolerance  Baseline: 5/50 Goal status: INITIAL   2.  Pt will decrease pain to 1/10 with R  thoracic rotation to show improved tolerance with daily activities Baseline: 5/10 Goal status: INITIAL   3.  Pt will improve L and R lower and middle trap MMT's to 4+/5 to enhance scapular control and improve posture during functional tasks  Baseline: 3/5 Goal status: INITIAL                       4. Pt will improve all thoracic AROM to WNL to improve functional abilities with reaching, lifting, and carrying activities                                  Baseline: Extension, side bending - 75% , R rotation - 50%                                  Goal status: INITIAL      PLAN:   PT FREQUENCY: 1-2x/week   PT DURATION: 4 weeks   PLANNED INTERVENTIONS: 97110-Therapeutic exercises, 97530- Therapeutic activity, 97112- Neuromuscular re-education, 97535- Self Care, and 02859- Manual therapy.   PLAN FOR NEXT SESSION: Plan to assess response to HEP, continue with periscapular strengthening and thoracic mobility. Assess rhomboid tenderness.  Miklos Bidinger, SPT 11/09/23 11:53 AM

## 2023-11-14 ENCOUNTER — Encounter: Admitting: Physical Therapy

## 2023-11-16 ENCOUNTER — Ambulatory Visit: Payer: Self-pay | Admitting: Family Medicine

## 2023-11-20 ENCOUNTER — Encounter: Admitting: Physical Therapy

## 2023-11-22 ENCOUNTER — Ambulatory Visit: Admitting: Obstetrics and Gynecology

## 2023-11-22 ENCOUNTER — Other Ambulatory Visit: Payer: Self-pay

## 2023-11-22 ENCOUNTER — Other Ambulatory Visit (HOSPITAL_COMMUNITY)
Admission: RE | Admit: 2023-11-22 | Discharge: 2023-11-22 | Disposition: A | Discharge status: Home / Self Care | Source: Ambulatory Visit | Attending: Obstetrics and Gynecology | Admitting: Obstetrics and Gynecology

## 2023-11-22 ENCOUNTER — Encounter: Payer: Self-pay | Admitting: Obstetrics and Gynecology

## 2023-11-22 VITALS — BP 115/78 | HR 80 | Ht 61.0 in | Wt 178.3 lb

## 2023-11-22 DIAGNOSIS — N921 Excessive and frequent menstruation with irregular cycle: Secondary | ICD-10-CM | POA: Diagnosis not present

## 2023-11-22 DIAGNOSIS — Z789 Other specified health status: Secondary | ICD-10-CM

## 2023-11-22 DIAGNOSIS — Z113 Encounter for screening for infections with a predominantly sexual mode of transmission: Secondary | ICD-10-CM | POA: Diagnosis not present

## 2023-11-22 DIAGNOSIS — Z01419 Encounter for gynecological examination (general) (routine) without abnormal findings: Secondary | ICD-10-CM | POA: Diagnosis not present

## 2023-11-22 DIAGNOSIS — Z124 Encounter for screening for malignant neoplasm of cervix: Secondary | ICD-10-CM | POA: Insufficient documentation

## 2023-11-22 DIAGNOSIS — Z23 Encounter for immunization: Secondary | ICD-10-CM

## 2023-11-22 MED ORDER — NORELGESTROMIN-ETH ESTRADIOL 150-35 MCG/24HR TD PTWK: 1.0000 | MEDICATED_PATCH | TRANSDERMAL | 12 refills | Status: DC

## 2023-11-22 NOTE — Progress Notes (Signed)
 GYNECOLOGY ANNUAL PREVENTATIVE CARE ENCOUNTER NOTE  History:     Paula Stark is a 33 y.o. [id number] female here for a routine annual gynecologic exam.  Current complaints: increased spotting s/p 2 doses of depo provera .   Denies discharge, pelvic pain, problems with intercourse or other gynecologic concerns.    Gynecologic History No LMP recorded. Patient has had an injection. Contraception: Depo-Provera  injections, converting to patch Last Pap: 4/21. Results were: normal with negative HPV Last mammogram: n/a  Obstetric History OB History  Gravida Para Term Preterm AB Living  5 4 4  0 1 4  SAB IAB Ectopic Multiple Live Births  1 0 0 0 4    # Outcome Date GA Lbr Len/2nd Weight Sex Type Anes PTL Lv  5 Term 05/04/20 [redacted]w[redacted]d 01:51 / 00:13 8 lb 5.7 oz (3.79 kg) F Vag-Spont EPI  LIV  4 Term 02/29/16 [redacted]w[redacted]d 02:55 / 06:10 8 lb 11.7 oz (3.96 kg) M Vag-Spont EPI  LIV     Birth Comments: caput  3 SAB 2016          2 Term 05/22/13 [redacted]w[redacted]d 05:31 / 03:47 8 lb (3.63 kg) M Vag-Spont EPI  LIV  1 Term 07/12/11 [redacted]w[redacted]d 16:46 / 00:55 7 lb 8.8 oz (3.425 kg) M Vag-Spont None  LIV     Birth Comments: none    Past Medical History:  Diagnosis Date   Eczema    Gestational diabetes    Hemorrhoid    Postpartum hemorrhage     Past Surgical History:  Procedure Laterality Date   DILATION AND EVACUATION N/A 04/20/2015   Procedure: DILATATION AND EVACUATION;  Surgeon: Lynwood KANDICE Solomons, MD;  Location: [facility];  Service: Gynecology;  Laterality: N/A;    Current Outpatient Medications on File Prior to Visit  Medication Sig Dispense Refill   Accu-Chek Softclix Lancets lancets Use to check blood sugar 3 times daily 200 each 3   Blood Glucose Monitoring Suppl (ACCU-CHEK GUIDE) w/Device KIT Check blood sugar once daily 1 kit 0   famotidine  (PEPCID ) 20 MG tablet TAKE 1 TABLET(20 MG) BY MOUTH DAILY 30 tablet 1   glucose blood (ACCU-CHEK GUIDE TEST) test strip Use as instructed up to 3 times per day for blood sugar  management 500 each 12   Lancets Misc. (ACCU-CHEK SOFTCLIX LANCET DEV) KIT USE TO CHECK BLOOD SUGAR THREE TIMES DAILY 1 kit 3   pantoprazole  (PROTONIX ) 40 MG tablet Take 1 tablet (40 mg total) by mouth daily. 30 tablet 3   rosuvastatin  (CRESTOR ) 10 MG tablet Take 1 tablet (10 mg total) by mouth daily. 90 tablet 3   No current facility-administered medications on file prior to visit.    No Known Allergies  Social History:  reports that she has never smoked. She has never used smokeless tobacco. She reports that she does not drink alcohol and does not use drugs.  Family History  Problem Relation Age of Onset   Diabetes Neg Hx    Hypertension Neg Hx    Obesity Neg Hx     The following portions of the patient's history were reviewed and updated as appropriate: allergies, current medications, past family history, past medical history, past social history, past surgical history and problem list.  Review of Systems Pertinent items noted in HPI and remainder of comprehensive ROS otherwise negative.  Physical Exam:  BP 115/78   Pulse 80   Ht 5' 1 (1.549 m)   Wt 178 lb 4.8 oz (80.9 kg)  BMI 33.69 kg/m  CONSTITUTIONAL: Well-developed, obese, well-nourished female in no acute distress.  HENT:  Normocephalic, atraumatic, External right and left ear normal. Oropharynx is clear and moist EYES: Conjunctivae and EOM are normal.  NECK: Normal range of motion, supple, no masses.  Normal thyroid.  SKIN: Skin is warm and dry. No rash noted. Not diaphoretic. No erythema. No pallor. MUSCULOSKELETAL: Normal range of motion. No tenderness.  No cyanosis, clubbing, or edema.  2+ distal pulses. NEUROLOGIC: Alert and oriented to person, place, and time. Normal reflexes, muscle tone coordination.  PSYCHIATRIC: Normal mood and affect. Normal behavior. Normal judgment and thought content. CARDIOVASCULAR: Normal heart rate noted, regular rhythm RESPIRATORY: Clear to auscultation bilaterally. Effort and  breath sounds normal, no problems with respiration noted. BREASTS: deferred ABDOMEN: Soft, no distention noted.  No tenderness, rebound or guarding.  PELVIC: Normal appearing external genitalia and urethral meatus; normal appearing vaginal mucosa and cervix.  No abnormal discharge noted.  Pap smear obtained.  Vaginal swab obtained. Normal uterine size, no other palpable masses, no uterine or adnexal tenderness.  Performed in the presence of a chaperone.   Assessment and Plan:    1. Cervical cancer screening (Primary)  - Cytology - PAP( [place])  2. Screening for STD (sexually transmitted disease) Per pt request  - HIV Antibody (routine testing w rflx) - [facility] - Hepatitis B Surface AntiGEN - Hepatitis C Antibody - Cervicovaginal ancillary only( [place])  3. Well female exam with routine gynecological exam Normal annual exam  - Flu vaccine trivalent PF, 6mos and older(Flulaval,Afluria,Fluarix,Fluzone)  4. Breakthrough bleeding on Depo-Provera  Pt would like to transition to contraceptive patch Instructions given, advised to start in October  5. Uses hormonal contraceptive patch as primary birth control method  - norelgestromin -ethinyl estradiol  (XULANE) 150-35 MCG/24HR transdermal patch; Place 1 patch onto the skin once a week. Start in October  Dispense: 3 patch; Refill: 12  Will follow up results of pap smear and manage accordingly. Mammogram scheduled Routine preventative health maintenance measures emphasized. Please refer to After Visit Summary for other counseling recommendations.      Jerilynn Buddle, MD, FACOG Obstetrician & Gynecologist, [facility] for [facility], [facility]

## 2023-11-23 ENCOUNTER — Ambulatory Visit

## 2023-11-23 ENCOUNTER — Ambulatory Visit: Payer: Self-pay | Admitting: Obstetrics and Gynecology

## 2023-11-23 DIAGNOSIS — B9689 Other specified bacterial agents as the cause of diseases classified elsewhere: Secondary | ICD-10-CM

## 2023-11-23 LAB — HIV ANTIBODY (ROUTINE TESTING W REFLEX): HIV Screen 4th Generation wRfx: NONREACTIVE

## 2023-11-23 LAB — HEPATITIS B SURFACE ANTIGEN: Hepatitis B Surface Ag: NEGATIVE

## 2023-11-23 LAB — RPR: RPR Ser Ql: NONREACTIVE

## 2023-11-23 LAB — HEPATITIS C ANTIBODY: Hep C Virus Ab: NONREACTIVE

## 2023-11-23 NOTE — Therapy (Incomplete)
 OUTPATIENT PHYSICAL THERAPY TREATMENT     Patient Name: Paula Stark MRN: [id number] DOB:[birth year], 33 y.o., female Today's Date: 10/08/2023   END OF SESSION:           PCP: Theophilus Pagan, MD   REFERRING PROVIDER: Donzetta Rollene BRAVO, MD   REFERRING DIAG: [id number] (ICD-10-CM) - Chronic left-sided thoracic back pain    Rationale for Evaluation and Treatment: Rehabilitation   THERAPY DIAG:  No diagnosis found.    ONSET DATE: April, 2025   SUBJECTIVE:                                                                                                                                                                                            SUBJECTIVE STATEMENT: ***  EVAL: Pt presents to PT today, referred for left thoracic pain that radiates laterally. Pt states her pain began without a clear onset, woke up one morning in April with pain in her thoracic spine that has not gone away. Pt reports the pain is a deep ache that burns sometimes. Pain is pretty constant and does not get better or worse. 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 The pt reports no emotional or mental burden from her pain.   PERTINENT HISTORY:  DM 2   PAIN:  Are you having pain?  Yes: NPRS scale: 5/10 Pain location: L thoracic spine between scapula and spine Pain description: Burning, shooting, deep Aggravating factors: Right thoracic rotation Relieving factors: none specified   PRECAUTIONS: None   RED FLAGS: None      WEIGHT BEARING RESTRICTIONS: No   FALLS:  Has patient fallen in last 6 months? No   LIVING ENVIRONMENT: Lives with: lives with their family Lives in: House/apartment Has following equipment at home: None   OCCUPATION: Stay at home Mom   PLOF: Independent   PATIENT GOALS: Reduce thoracic pain, pain free with ADLs   NEXT MD VISIT: 10/30/2023   OBJECTIVE:  Note: Objective measures were completed at Evaluation unless otherwise noted.    DIAGNOSTIC FINDINGS:  N/a   PATIENT SURVEYS:  ODI: 5/50   COGNITION: Overall cognitive status: Within functional limits for tasks assessed                          SENSATION: Not tested   MUSCLE LENGTH:     POSTURE: rounded shoulders, forward head, and increased thoracic kyphosis   PALPATION: TTP - rhomboids   THORACIC ROM:    AROM eval  Flexion WNL  Extension 75%  Right lateral flexion 75%  Left  lateral flexion 75%  Right rotation 50%  Left rotation WNL   (Blank rows = not tested)   UPPER EXTREMITY ROM:   Active ROM Right eval Left eval  Shoulder flexion WNL WNL  Shoulder extension      Shoulder abduction WNL WNL  Shoulder adduction      Shoulder extension      Shoulder internal rotation      Shoulder external rotation      Elbow flexion      Elbow extension      Wrist flexion      Wrist extension      Wrist ulnar deviation      Wrist radial deviation      Wrist pronation      Wrist supination       (Blank rows = not tested) UPPER EXTREMITY MMT:   MMT Right eval Left eval  Shoulder flexion      Shoulder extension      Shoulder abduction      Shoulder adduction      Shoulder extension      Shoulder internal rotation      Shoulder external rotation 4+ 4  Middle trapezius 3 3  Lower trapezius 3 3  Elbow flexion      Elbow extension      Wrist flexion      Wrist extension      Wrist ulnar deviation      Wrist radial deviation      Wrist pronation      Wrist supination      Grip strength       (Blank rows = not tested)   FUNCTIONAL TESTS:      GAIT: Distance walked: 39ft Assistive device utilized: None Level of assistance: Complete Independence Comments: slight lateral sway   TREATMENT: [facility] Adult PT Treatment:                                              DATE: 11/09/2023 Therapeutic Exercise UBE lvl 1.0 x 3 min for functional activity tolerance Prone SA Y's 2# 2x10 SL open books x 15 ea Foam roller rollouts up wall for thoracic  extension x10 2 hold Cat Cow x15 Gtb horizontal abduction x10 rtb x 10 FM row 3x10 15# 25# 35# Manual Therapy: Grd 3/4 mobs at T4-T6 Grade V prone thrust manipulation to thoracic spine with cavitation  [facility] Adult PT Treatment:                                              DATE: 11/05/2023 Supine horizontal abd 2x15 RTB S/L open book x 10 ea Cat Cow x 10 Prone Y/T 2x10  Seated bilateral ER 2x15 RTB Standing row 2x10 blue band Foam roller thoracic mobility routine  Horizontal abd/add x 15 Protraction/retraction x 15 Alt flex/ext x 15 Seated thoracic ext over foam x 15 Manual Therapy: Grade III PA mobs to T4-6 Grade V prone thrust manipulation to thoracic spine with cavitation  [facility] Adult PT Treatment:  DATE: 11/02/2023 Therapeutic Exercise UBE lvl 1.0 x 3 min for functional activity tolerance Prone SA Y's 3x10 L SL open books x 10 ea Foam roller rollouts up wall for thoracic extension 2x10 2 hold Cat Cow x15 Supine rtb pull apart, towel rolled under t spine x 10 Ytb SA horizontal abduction 2x10 FM row 3x10 15# Manual Therapy: Grd 3/4 mobs at T4-T6 - pt stated this was the right spot  [facility] Adult PT Treatment:                                              DATE: 10/30/2023 UBE lvl 1.0 x 3 min for functional activity tolerance Quadruped Y's x10 Foam roller rollouts up wall for thoracic extension 2x10 2 hold Cat Cow x15 Supine rtb pull apart, towel rolled under t spine Manual Therapy: STM to L rhomboids Grd 3 mobs at upper,mid T spine - elicited pain wrapping around spine  [facility] Adult PT Treatment:                                                DATE: 10/19/2023 UBE lvl 1.0 x 3 min for functional activity tolerance S/L open books x 10 ea Supine horizontal abd 2x10 RTB Prone Y's 3x8 Seated bilateral ER 3x15 RTB towel under elbow Standing rtb T's 2x15 Standing rtb Y's Foam roller rollouts up wall for thoracic extension 2x10 2  hold FM row 2x8 10# 15# Manual Therapy: STM to L rhomboids   [facility] Adult PT Treatment:                                                DATE: 10/15/2023 UBE lvl 1.0 x 3 min for functional activity tolerance S/L open books x 10 ea Supine horizontal abd 2x10 RTB Supine dow flexion x 15 Seated bilateral ER 2x10 RTB Qped thread the needle with soft foam x 10 ea Hooklying thoracic ext over soft foam x 10 FM row 2x10 13# Manual Therapy: STM to L rhomboids IASTM to L rhomboids and paraspinals Grade III PA mobs to T1-4  [facility] Adult PT Treatment:                                                DATE: 10/08/2023 Therapeutic Exercise: RTB seated row  RTB band pull aparts  Needle threads against wall Prone I's and Y's Manual Therapy: STM to L rhomboids   PATIENT EDUCATION:  Education details: Provided education on evaluation findings, expectations, and performance of HEP Person educated: Patient Education method: Explanation, Demonstration, and Handouts Education comprehension: verbalized understanding and returned demonstration   HOME EXERCISE PROGRAM: Access Code: [email or web address] URL:  [email or web address]    Date: 10/08/2023 Prepared by: Alm Kingdom  Exercises - Seated Scapular Retraction  - 1 x daily - 7 x weekly - 2 sets - 10 reps - 3 sec hold    Scapular Retraction with Resistance  - 1 x daily - 7 x weekly - 3 sets -  10 reps - red band hold    Seated Shoulder Horizontal Abduction with Resistance  - 1 x daily - 7 x weekly - 3 sets - 10 reps - red band hold    Plank with Thoracic Rotation on Counter  - 1 x daily - 7 x weekly - 2 sets - 10 reps    Prone Scapular Retraction Y  - 1 x daily - 7 x weekly - 2 sets - 10 reps - Prone I  - 1 x daily - 7 x weekly - 2 sets - 10 reps    ASSESSMENT:   CLINICAL IMPRESSION: ***  EVAL: Patient is a 33 y.o. F who was seen today for physical therapy evaluation and treatment for L sided thoracic pain. Pt presentation is consistent with L  periscapular weakness. Symptoms began this past April and have been constant ever since. Pts activities as a mother with young kids as well as posture may be big contributors to the presentation. Concordant symptoms were provoked with ROM testing, during manual therapy the patient remarked that the pain she feels may be a little bit deeper. Overall, the patient will benefit from skilled physical therapy to address deficits in strengthen and ROM in order to improve participation in daily life.   OBJECTIVE IMPAIRMENTS: decreased mobility, decreased ROM, decreased strength, postural dysfunction, and pain.    ACTIVITY LIMITATIONS: carrying, lifting, bending, transfers, bed mobility, toileting, dressing, and caring for others   PARTICIPATION LIMITATIONS: meal prep, cleaning, laundry, and driving   PERSONAL FACTORS: Fitness and 1 comorbidity: DM 2 are also affecting patient's functional outcome.    REHAB POTENTIAL: Good - clear mechanical provocation   CLINICAL DECISION MAKING: Stable/uncomplicated   EVALUATION COMPLEXITY: Low     GOALS: Goals reviewed with patient? Yes   SHORT TERM GOALS: Target date: 10/29/2023   Pt will demonstration understanding and performance of HEP to facilitate independence with managing symptoms  Baseline: HEP given at eval  Goal status: MET   2.  Pt will decrease pain to 3/10 with R thoracic rotation to show improved tolerance with daily activities  Baseline: 5/10 8/11: 6/10 Goal status: ONGOING     LONG TERM GOALS: Target date: 12/03/2023   Pt will improve ODI score by MCID to show improvement in functional tolerance  Baseline: 5/50 Goal status: INITIAL   2.  Pt will decrease pain to 1/10 with R thoracic rotation to show improved tolerance with daily activities Baseline: 5/10 Goal status: INITIAL   3.  Pt will improve L and R lower and middle trap MMT's to 4+/5 to enhance scapular control and improve posture during functional tasks  Baseline:  3/5 Goal status: INITIAL                       4. Pt will improve all thoracic AROM to WNL to improve functional abilities with reaching, lifting, and carrying activities                                  Baseline: Extension, side bending - 75% , R rotation - 50%                                  Goal status: INITIAL      PLAN:   PT FREQUENCY: 1-2x/week   PT DURATION: 4 weeks   PLANNED  INTERVENTIONS: 97110-Therapeutic exercises, 97530- Therapeutic activity, [id number]- Neuromuscular re-education, 97535- Self Care, and 02859- Manual therapy.   PLAN FOR NEXT SESSION: Plan to assess response to HEP, continue with periscapular strengthening and thoracic mobility. Assess rhomboid tenderness.  Alm BROCKS [place] PT  11/23/23 7:53 AM

## 2023-11-26 ENCOUNTER — Ambulatory Visit: Attending: Family Medicine

## 2023-11-26 DIAGNOSIS — R293 Abnormal posture: Secondary | ICD-10-CM | POA: Diagnosis present

## 2023-11-26 DIAGNOSIS — M6281 Muscle weakness (generalized): Secondary | ICD-10-CM | POA: Diagnosis present

## 2023-11-26 DIAGNOSIS — M546 Pain in thoracic spine: Secondary | ICD-10-CM | POA: Insufficient documentation

## 2023-11-26 LAB — CERVICOVAGINAL ANCILLARY ONLY
Bacterial Vaginitis (gardnerella): POSITIVE — AB
Candida Glabrata: NEGATIVE
Candida Vaginitis: NEGATIVE
Chlamydia: NEGATIVE
Comment: NEGATIVE
Comment: NEGATIVE
Comment: NEGATIVE
Comment: NEGATIVE
Comment: NEGATIVE
Comment: NORMAL
Neisseria Gonorrhea: NEGATIVE
Trichomonas: NEGATIVE

## 2023-11-26 NOTE — Therapy (Signed)
 OUTPATIENT PHYSICAL THERAPY TREATMENT/DISCHARGE  PHYSICAL THERAPY DISCHARGE SUMMARY  Visits from Start of Care: 8  Current functional level related to goals / functional outcomes: See goals and objective   Remaining deficits: See goals and objective   Education / Equipment: HEP   Patient agrees to discharge. Patient goals were partially met. Patient is being discharged due to being pleased with the current functional level.    Patient Name: Paula Stark MRN: [id number] DOB:01/22/1991, 33 y.o., female Today's Date: 10/08/2023   END OF SESSION:  PT End of Session - 11/26/23 0848     Visit Number 8    Number of Visits 17    Date for PT Re-Evaluation 12/10/23    PT Start Time 0848    PT Stop Time 0926    PT Time Calculation (min) 38 min    Activity Tolerance Patient tolerated treatment well    Behavior During Therapy WFL for tasks assessed/performed                  PCP: Theophilus Pagan, MD   REFERRING PROVIDER: Donzetta Rollene BRAVO, MD   REFERRING DIAG: [id number] (ICD-10-CM) - Chronic left-sided thoracic back pain    Rationale for Evaluation and Treatment: Rehabilitation   THERAPY DIAG:  Pain in thoracic spine  Abnormal posture  Muscle weakness (generalized)    ONSET DATE: April, 2025   SUBJECTIVE:                                                                                                                                                                                            SUBJECTIVE STATEMENT: Pt presents to PT noting pain has decreased but is still there. 2/10 today along L periscapular area.   EVAL: Pt presents to PT today, referred for left thoracic pain that radiates laterally. Pt states her pain began without a clear onset, woke up one morning in April with pain in her thoracic spine that has not gone away. Pt reports the pain is a deep ache that burns sometimes. Pain is pretty constant and does not get better or worse. 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 The pt reports no emotional or mental burden from her pain.   PERTINENT HISTORY:  DM 2   PAIN:  Are you having pain?  Yes: NPRS scale: 5/10 Pain location: L thoracic spine between scapula and spine Pain description: Burning, shooting, deep Aggravating factors: Right thoracic rotation Relieving factors: none specified   PRECAUTIONS: None   RED FLAGS: None      WEIGHT BEARING RESTRICTIONS: No   FALLS:  Has patient fallen in last 6 months? No   LIVING ENVIRONMENT: Lives with: lives with their family Lives in: House/apartment Has following equipment at home: None   OCCUPATION: Stay at home Mom   PLOF: Independent   PATIENT GOALS: Reduce thoracic pain, pain free with ADLs   NEXT MD VISIT: 10/30/2023   OBJECTIVE:  Note: Objective measures were completed at Evaluation unless otherwise noted.   DIAGNOSTIC FINDINGS:  N/a   PATIENT SURVEYS:  ODI: 5/50   COGNITION: Overall cognitive status: Within functional limits for tasks assessed                          SENSATION: Not tested   MUSCLE LENGTH:     POSTURE: rounded shoulders, forward head, and increased thoracic kyphosis   PALPATION: TTP - rhomboids   THORACIC ROM:    AROM eval  Flexion WNL  Extension 75%  Right lateral flexion 75%  Left lateral flexion 75%  Right rotation 50%  Left rotation WNL   (Blank rows = not tested)   UPPER EXTREMITY ROM:   Active ROM Right eval Left eval  Shoulder flexion WNL WNL  Shoulder extension      Shoulder abduction WNL WNL  Shoulder adduction      Shoulder extension      Shoulder internal rotation      Shoulder external rotation      Elbow flexion      Elbow extension      Wrist flexion      Wrist extension      Wrist ulnar deviation      Wrist radial deviation      Wrist pronation      Wrist supination       (Blank rows = not tested) UPPER EXTREMITY MMT:   MMT Right eval Left eval 11/26/23 11/26/23  Shoulder  flexion        Shoulder extension        Shoulder abduction        Shoulder adduction        Shoulder extension        Shoulder internal rotation        Shoulder external rotation 4+ 4    Middle trapezius 3 3 4 4   Lower trapezius 3 3 4 4   Elbow flexion        Elbow extension        Wrist flexion        Wrist extension        Wrist ulnar deviation        Wrist radial deviation        Wrist pronation        Wrist supination        Grip strength         (Blank rows = not tested)   FUNCTIONAL TESTS:      GAIT: Distance walked: 57ft Assistive device utilized: None Level of assistance: Complete Independence Comments: slight lateral sway   TREATMENT: [facility] Adult PT Treatment:                                              DATE: 11/26/2023 Supine horizontal abd 2x15 RTB S/L open book x 10 ea Cat Cow x 10 Prone Y/T 2x10  Seated bilateral ER 2x10 RTB Supine horizontal abd 2x15 RTB Standing  row 2x10 blue band Reassessment of tests/measures, goals, and outcomes for discharge Manual Therapy: Grade III PA mobs to T4-6 Grade V prone thrust manipulation to thoracic spine with cavitation TRP to L thoracic paraspinals  [facility] Adult PT Treatment:                                              DATE: 11/09/2023 Therapeutic Exercise UBE lvl 1.0 x 3 min for functional activity tolerance Prone SA Y's 2# 2x10 SL open books x 15 ea Foam roller rollouts up wall for thoracic extension x10 2 hold Cat Cow x15 Gtb horizontal abduction x10 rtb x 10 FM row 3x10 15# 25# 35# Manual Therapy: Grd 3/4 mobs at T4-T6 Grade V prone thrust manipulation to thoracic spine with cavitation  [facility] Adult PT Treatment:                                              DATE: 11/05/2023 Supine horizontal abd 2x15 RTB S/L open book x 10 ea Cat Cow x 10 Prone Y/T 2x10  Seated bilateral ER 2x15 RTB Standing row 2x10 blue band Foam roller thoracic mobility routine  Horizontal abd/add x 15 Protraction/retraction x 15 Alt  flex/ext x 15 Seated thoracic ext over foam x 15 Manual Therapy: Grade III PA mobs to T4-6 Grade V prone thrust manipulation to thoracic spine with cavitation  [facility] Adult PT Treatment:                                              DATE: 11/02/2023 Therapeutic Exercise UBE lvl 1.0 x 3 min for functional activity tolerance Prone SA Y's 3x10 L SL open books x 10 ea Foam roller rollouts up wall for thoracic extension 2x10 2 hold Cat Cow x15 Supine rtb pull apart, towel rolled under t spine x 10 Ytb SA horizontal abduction 2x10 FM row 3x10 15# Manual Therapy: Grd 3/4 mobs at T4-T6 - pt stated this was the right spot  [facility] Adult PT Treatment:                                              DATE: 10/30/2023 UBE lvl 1.0 x 3 min for functional activity tolerance Quadruped Y's x10 Foam roller rollouts up wall for thoracic extension 2x10 2 hold Cat Cow x15 Supine rtb pull apart, towel rolled under t spine Manual Therapy: STM to L rhomboids Grd 3 mobs at upper,mid T spine - elicited pain wrapping around spine  PATIENT EDUCATION:  Education details: Provided education on evaluation findings, expectations, and performance of HEP Person educated: Patient Education method: Explanation, Demonstration, and Handouts Education comprehension: verbalized understanding and returned demonstration   HOME EXERCISE PROGRAM: Access Code: [email or web address] URL: [email or web address] Date: 11/26/2023 Prepared by: Alm Kingdom  Exercises - Prone Scapular Retraction Y  - 1 x daily - 7 x weekly - 2 sets - 10 reps - Prone Scapular Retraction Arms at Side  - 1 x daily - 7 x weekly - 2 sets - 10  reps - Cat Cow  - 1 x daily - 7 x weekly - 2 sets - 10 reps - Sidelying Thoracic Rotation with Open Book  - 1 x daily - 7 x weekly - 3 sets - 10 reps - Supine Shoulder Horizontal Abduction with Resistance  - 1 x daily - 7 x weekly - 2 sets - 15 reps - red band hold - Shoulder External Rotation and Scapular  Retraction with Resistance  - 1 x daily - 7 x weekly - 3 sets - 10 reps - red band hold - Scapular Retraction with Resistance  - 1 x daily - 7 x weekly - 3 sets - 10 reps - blue  band hold   ASSESSMENT:   CLINICAL IMPRESSION: Pt was able to complete prescribed exercises and demonstrated knowledge of HEP. Over the course of PT treatment she has made some progress with therapy noting 3/10 pain at worst and improved activity tolerance. No change in ODI score but her postural strength has improved. She should continue to improve with HEP compliance and is being discharged at this time.   EVAL: Patient is a 33 y.o. F who was seen today for physical therapy evaluation and treatment for L sided thoracic pain. Pt presentation is consistent with L periscapular weakness. Symptoms began this past April and have been constant ever since. Pts activities as a mother with young kids as well as posture may be big contributors to the presentation. Concordant symptoms were provoked with ROM testing, during manual therapy the patient remarked that the pain she feels may be a little bit deeper. Overall, the patient will benefit from skilled physical therapy to address deficits in strengthen and ROM in order to improve participation in daily life.   OBJECTIVE IMPAIRMENTS: decreased mobility, decreased ROM, decreased strength, postural dysfunction, and pain.    ACTIVITY LIMITATIONS: carrying, lifting, bending, transfers, bed mobility, toileting, dressing, and caring for others   PARTICIPATION LIMITATIONS: meal prep, cleaning, laundry, and driving   PERSONAL FACTORS: Fitness and 1 comorbidity: DM 2 are also affecting patient's functional outcome.    REHAB POTENTIAL: Good - clear mechanical provocation   CLINICAL DECISION MAKING: Stable/uncomplicated   EVALUATION COMPLEXITY: Low     GOALS: Goals reviewed with patient? Yes   SHORT TERM GOALS: Target date: 10/29/2023   Pt will demonstration understanding and  performance of HEP to facilitate independence with managing symptoms  Baseline: HEP given at eval  Goal status: MET   2.  Pt will decrease pain to 3/10 with R thoracic rotation to show improved tolerance with daily activities  Baseline: 5/10 8/11: 6/10 11/26/23: 3/10 Goal status: MET     LONG TERM GOALS: Target date: 12/03/2023   Pt will improve ODI score by MCID to show improvement in functional tolerance  Baseline: 5/50 11/26/23: 5/50 Goal status: NOT MET   2.  Pt will decrease pain to 1/10 with R thoracic rotation to show improved tolerance with daily activities Baseline: 5/10 11/26/23: 3/10 Goal status: MET   3.  Pt will improve L and R lower and middle trap MMT's to 4+/5 to enhance scapular control and improve posture during functional tasks  Baseline: 3/5 11/26/23: 4/5 Goal status: MOSTLY MET                        4. Pt will improve all thoracic AROM to WNL to improve functional abilities with reaching, lifting, and carrying activities Baseline: Extension, side bending - 75% R rotation -  50%                                  Goal status: MET     PLAN:   PT FREQUENCY: 1-2x/week   PT DURATION: 4 weeks   PLANNED INTERVENTIONS: 97110-Therapeutic exercises, 97530- Therapeutic activity, [id number]- Neuromuscular re-education, 97535- Self Care, and 02859- Manual therapy.   PLAN FOR NEXT SESSION: Plan to assess response to HEP, continue with periscapular strengthening and thoracic mobility. Assess rhomboid tenderness.  Alm JAYSON Kingdom PT  11/26/23 9:29 AM

## 2023-11-27 LAB — CYTOLOGY - PAP
Comment: NEGATIVE
Diagnosis: NEGATIVE
Diagnosis: REACTIVE
High risk HPV: NEGATIVE

## 2023-11-27 MED ORDER — METRONIDAZOLE 500 MG PO TABS: 500.0000 mg | ORAL_TABLET | Freq: Two times a day (BID) | ORAL | 0 refills | Status: AC

## 2023-12-18 ENCOUNTER — Other Ambulatory Visit: Payer: Self-pay | Admitting: Family Medicine

## 2023-12-18 DIAGNOSIS — K0889 Other specified disorders of teeth and supporting structures: Secondary | ICD-10-CM

## 2023-12-20 ENCOUNTER — Other Ambulatory Visit: Payer: Self-pay | Admitting: Obstetrics and Gynecology

## 2023-12-20 DIAGNOSIS — B9689 Other specified bacterial agents as the cause of diseases classified elsewhere: Secondary | ICD-10-CM

## 2023-12-21 ENCOUNTER — Encounter: Payer: Self-pay | Admitting: Gastroenterology

## 2023-12-21 ENCOUNTER — Ambulatory Visit: Admitting: Gastroenterology

## 2023-12-21 VITALS — BP 102/74 | HR 75 | Ht 62.0 in | Wt 179.6 lb

## 2023-12-21 DIAGNOSIS — R1012 Left upper quadrant pain: Secondary | ICD-10-CM

## 2023-12-21 DIAGNOSIS — M5442 Lumbago with sciatica, left side: Secondary | ICD-10-CM | POA: Diagnosis not present

## 2023-12-21 DIAGNOSIS — K219 Gastro-esophageal reflux disease without esophagitis: Secondary | ICD-10-CM | POA: Diagnosis not present

## 2023-12-21 NOTE — Progress Notes (Signed)
 12/21/2023 Paula Stark [id number] 1990-09-29   HISTORY OF PRESENT ILLNESS:  This is a 33 year old Asian female who is new to our office.  She is here today with complaints of left upper quadrant abdominal pain, GERD, and actually left sided back pain that has been present since about April.  Tested positive for H. pylori and she was treated for that with Pylera.  Some burning reflux symptoms got better with that, but still having left upper quadrant abdominal pain and left-sided back pain.  H. pylori negative x 2 since her treatment, last in August.  She is on pantoprazole  40 mg daily and famotidine  20 mg daily in the afternoon.  Did try naproxen  and went to physical therapy.  Did not take any NSAIDs regularly though.  EKGs and troponins have been okay.  She has also had chest, thoracic spine, and rib x-rays as well as a CT scan abdomen and pelvis with contrast that have been unremarkable.  Lipase and hepatic function panel normal.  CBC normal.   Past Medical History:  Diagnosis Date   Eczema    Gestational diabetes    Hemorrhoid    Postpartum hemorrhage    Past Surgical History:  Procedure Laterality Date   DILATION AND EVACUATION N/A 04/20/2015   Procedure: DILATATION AND EVACUATION;  Surgeon: Lynwood KANDICE Solomons, MD;  Location: [facility];  Service: Gynecology;  Laterality: N/A;    reports that she has never smoked. She has never used smokeless tobacco. She reports that she does not drink alcohol and does not use drugs. family history is not on file. No Known Allergies    Outpatient Encounter Medications as of 12/21/2023  Medication Sig   Accu-Chek Softclix Lancets lancets Use to check blood sugar 3 times daily   Blood Glucose Monitoring Suppl (ACCU-CHEK GUIDE) w/Device KIT Check blood sugar once daily   famotidine  (PEPCID ) 20 MG tablet TAKE 1 TABLET(20 MG) BY MOUTH DAILY   glucose blood (ACCU-CHEK GUIDE TEST) test strip Use as instructed up to 3 times per day for blood sugar management    Lancets Misc. (ACCU-CHEK SOFTCLIX LANCET DEV) KIT USE TO CHECK BLOOD SUGAR THREE TIMES DAILY   norelgestromin -ethinyl estradiol  (XULANE) 150-35 MCG/24HR transdermal patch Place 1 patch onto the skin once a week. Start in October   pantoprazole  (PROTONIX ) 40 MG tablet Take 1 tablet (40 mg total) by mouth daily.   rosuvastatin  (CRESTOR ) 10 MG tablet Take 1 tablet (10 mg total) by mouth daily.   No facility-administered encounter medications on file as of 12/21/2023.    REVIEW OF SYSTEMS  : All other systems reviewed and negative except where noted in the History of Present Illness.   PHYSICAL EXAM: BP 102/74   Pulse 75   Ht 5' 2 (1.575 m)   Wt 179 lb 9.6 oz (81.5 kg)   BMI 32.85 kg/m  General: Well developed Asian female in no acute distress Head: Normocephalic and atraumatic Eyes:  Sclerae anicteric, conjunctiva pink. Ears: Normal auditory acuity Lungs: Clear throughout to auscultation; no W/R/R. Heart: Regular rate and rhythm; no M/R/G. Abdomen: Soft, non-distended.  BS present.  Mild epigastric and LUQ TTP. Musculoskeletal: Symmetrical with no gross deformities  Skin: No lesions on visible extremities Extremities: No edema  Neurological: Alert oriented x 4, grossly non-focal Psychological:  Alert and cooperative. Normal mood and affect  ASSESSMENT AND PLAN: 33 year old Asian female who has complaints of left upper quadrant abdominal pain, GERD, and actually left sided back pain that has been  present since about April.  Tested positive for H. pylori and she was treated for that.  Some burning reflux symptoms got better with that, but still having left upper quadrant abdominal pain and left-sided back pain.  H. pylori negative x 2 since her treatment, last in August.  I wonder if the back pain could be more muscular.  Will plan for EGD to evaluate from a gastro standpoint.  Schedule with Dr. Wilhelmenia.  The risks, benefits, and alternatives to EGD were discussed with the patient and  she consents to proceed.  CC:  Pray, Margaret E, MD

## 2023-12-21 NOTE — Progress Notes (Signed)
 Attending Physician's Attestation   I have reviewed the chart.   I agree with the Advanced Practitioner's note, impression, and recommendations with any updates as below.    Corliss Parish, MD [facility] Office # [phone]

## 2023-12-21 NOTE — Patient Instructions (Signed)
You have been scheduled for an endoscopy. Please follow written instructions given to you at your visit today.  If you use inhalers (even only as needed), please bring them with you on the day of your procedure.  If you take any of the following medications, they will need to be adjusted prior to your procedure:   DO NOT TAKE 7 DAYS PRIOR TO TEST- Trulicity (dulaglutide) Ozempic, Wegovy (semaglutide) Mounjaro (tirzepatide) Bydureon Bcise (exanatide extended release)  DO NOT TAKE 1 DAY PRIOR TO YOUR TEST Rybelsus (semaglutide) Adlyxin (lixisenatide) Victoza (liraglutide) Byetta (exanatide) ___________________________________________________________________________

## 2023-12-24 ENCOUNTER — Ambulatory Visit

## 2023-12-24 NOTE — Progress Notes (Unsigned)
    SUBJECTIVE:   CHIEF COMPLAINT / HPI:   Diabetes Current Regimen: None CBGs: ***  Last A1c:  Lab Results  Component Value Date   HGBA1C 6.4 10/30/2023    Denies polyuria, polydipsia, hypoglycemia *** Last Eye Exam: *** Statin: Rosuvastatin  10 mg daily ACE/ARB: None  PERTINENT  PMH / PSH: ***  OBJECTIVE:   There were no vitals taken for this visit. ***  General: NAD, pleasant, able to participate in exam Cardiac: RRR, no murmurs. Respiratory: CTAB, normal effort, No wheezes, rales or rhonchi Abdomen: Bowel sounds present, nontender, nondistended Extremities: no edema or cyanosis. Skin: warm and dry, no rashes noted Neuro: alert, no obvious focal deficits Psych: Normal affect and mood  ASSESSMENT/PLAN:   No problem-specific Assessment & Plan notes found for this encounter.     Dr. Izetta Nap, DO [facility]    {    This will disappear when note is signed, click to select method of visit    :1}

## 2023-12-25 ENCOUNTER — Ambulatory Visit: Admitting: Family Medicine

## 2023-12-25 ENCOUNTER — Ambulatory Visit: Payer: Self-pay | Admitting: Family Medicine

## 2023-12-25 ENCOUNTER — Encounter: Payer: Self-pay | Admitting: Family Medicine

## 2023-12-25 VITALS — BP 110/76 | HR 73 | Wt 178.0 lb

## 2023-12-25 DIAGNOSIS — G8929 Other chronic pain: Secondary | ICD-10-CM

## 2023-12-25 DIAGNOSIS — M546 Pain in thoracic spine: Secondary | ICD-10-CM | POA: Diagnosis not present

## 2023-12-25 DIAGNOSIS — K219 Gastro-esophageal reflux disease without esophagitis: Secondary | ICD-10-CM

## 2023-12-25 DIAGNOSIS — E119 Type 2 diabetes mellitus without complications: Secondary | ICD-10-CM

## 2023-12-25 DIAGNOSIS — N898 Other specified noninflammatory disorders of vagina: Secondary | ICD-10-CM | POA: Diagnosis not present

## 2023-12-25 DIAGNOSIS — K0889 Other specified disorders of teeth and supporting structures: Secondary | ICD-10-CM

## 2023-12-25 LAB — POCT WET PREP (WET MOUNT)
Clue Cells Wet Prep Whiff POC: NEGATIVE
Trichomonas Wet Prep HPF POC: ABSENT
WBC, Wet Prep HPF POC: NONE SEEN

## 2023-12-25 MED ORDER — METHOCARBAMOL 500 MG PO TABS: 500.0000 mg | ORAL_TABLET | Freq: Every evening | ORAL | 1 refills | Status: AC | PRN

## 2023-12-25 MED ORDER — NAPROXEN 500 MG PO TABS: 500.0000 mg | ORAL_TABLET | Freq: Two times a day (BID) | ORAL | 0 refills | Status: AC

## 2023-12-25 MED ORDER — METFORMIN HCL ER (OSM) 500 MG PO TB24: 1000.0000 mg | ORAL_TABLET | Freq: Every day | ORAL | 1 refills | Status: DC

## 2023-12-25 MED ORDER — METFORMIN HCL 500 MG PO TABS: 1000.0000 mg | ORAL_TABLET | Freq: Two times a day (BID) | ORAL | 1 refills | Status: DC

## 2023-12-25 MED ORDER — CHLORHEXIDINE GLUCONATE 0.12 % MT SOLN: 15.0000 mL | Freq: Two times a day (BID) | OROMUCOSAL | 3 refills | Status: AC

## 2023-12-25 MED ORDER — METFORMIN HCL ER 500 MG PO TB24: 1000.0000 mg | ORAL_TABLET | Freq: Every day | ORAL | 1 refills | Status: DC

## 2023-12-25 MED ORDER — PREMARIN 0.625 MG/GM VA CREA: 1.0000 | TOPICAL_CREAM | VAGINAL | 4 refills | Status: AC

## 2023-12-25 NOTE — Assessment & Plan Note (Signed)
 Home BGL elevated over 200 throughout the day, will initiate metformin  as patient would like to avoid GLP-1 given prior side effects. -Start metformin  1000 mg daily -Follow-up in 6 to 8 weeks for A1c

## 2023-12-25 NOTE — Assessment & Plan Note (Signed)
 Symptomatically resolved, planning for EGD with GI in the coming days.

## 2023-12-25 NOTE — Patient Instructions (Signed)
 It was wonderful to see you today! Thank you for choosing [facility].   Please bring ALL of your medications with you to every visit.   Today we talked about:  Please take the Metformin , two tablets in the morning every day and continue to check your blood sugar. I will follow up with you in 2 months to get your A1c and see how things are going. For your back pain I sent in the Naproxen  you can take during the day and the muscle relaxer I would only take at night. Please continue to follow up with GI for the endoscopy and see Dr. Rumball for the therapy on 10/15. I sent in the mouth wash to use for your dental pain. Please still regularly see a dentist. We are doing the vaginal swab today and I will follow up with you about if treatment is needed.  Please follow up in 6-8 weeks   We are checking some labs today. If they are abnormal, I will call you. If they are normal, I will send you a MyChart message (if it is active) or a letter in the mail. If you do not hear about your labs in the next 2 weeks, please call the office.  Call the clinic at [phone] if your symptoms worsen or you have any concerns.  Please be sure to schedule follow up at the front desk before you leave today.   Izetta Nap, DO Family Medicine

## 2023-12-25 NOTE — Assessment & Plan Note (Signed)
 Likely MSK in origin, completed PT and OMT x 1 session reports recurrence in symptoms likely in the setting of continued exacerbation and caring for her young children.  Will trial NSAID after EGD and muscle relaxer nightly in addition to OMT follow-up.  If continues to have improved would consider MRI. -Naproxen  500 mg twice daily to start after EGD -Methocarbamol 500 mg nightly as needed -Scheduled for OMT on 10/15

## 2023-12-26 ENCOUNTER — Encounter: Payer: Self-pay | Admitting: Gastroenterology

## 2023-12-26 ENCOUNTER — Ambulatory Visit: Admitting: Gastroenterology

## 2023-12-26 VITALS — BP 106/77 | HR 68 | Temp 98.6°F | Resp 12 | Ht 62.0 in | Wt 179.0 lb

## 2023-12-26 DIAGNOSIS — K219 Gastro-esophageal reflux disease without esophagitis: Secondary | ICD-10-CM

## 2023-12-26 DIAGNOSIS — K2950 Unspecified chronic gastritis without bleeding: Secondary | ICD-10-CM

## 2023-12-26 DIAGNOSIS — R1012 Left upper quadrant pain: Secondary | ICD-10-CM

## 2023-12-26 DIAGNOSIS — K2289 Other specified disease of esophagus: Secondary | ICD-10-CM | POA: Diagnosis not present

## 2023-12-26 MED ORDER — SODIUM CHLORIDE 0.9 % IV SOLN: 500.0000 mL | Freq: Once | INTRAVENOUS | Status: DC

## 2023-12-26 NOTE — Patient Instructions (Signed)
  Resume previous diet  Continue present medications  Awaiting pathology results   YOU HAD AN ENDOSCOPIC PROCEDURE TODAY AT THE [facility]:   Refer to the procedure report that was given to you for any specific questions about what was found during the examination.  If the procedure report does not answer your questions, please call your gastroenterologist to clarify.  If you requested that your care partner not be given the details of your procedure findings, then the procedure report has been included in a sealed envelope for you to review at your convenience later.  YOU SHOULD EXPECT: Some feelings of bloating in the abdomen. Passage of more gas than usual.  Walking can help get rid of the air that was put into your GI tract during the procedure and reduce the bloating. If you had a lower endoscopy (such as a colonoscopy or flexible sigmoidoscopy) you may notice spotting of blood in your stool or on the toilet paper. If you underwent a bowel prep for your procedure, you may not have a normal bowel movement for a few days.  Please Note:  You might notice some irritation and congestion in your nose or some drainage.  This is from the oxygen used during your procedure.  There is no need for concern and it should clear up in a day or so.  SYMPTOMS TO REPORT IMMEDIATELY:  Following upper endoscopy (EGD)  Vomiting of blood or coffee ground material  New chest pain or pain under the shoulder blades  Painful or persistently difficult swallowing  New shortness of breath  Fever of 100F or higher  Black, tarry-looking stools  For urgent or emergent issues, a gastroenterologist can be reached at any hour by calling [phone]. Do not use MyChart messaging for urgent concerns.    DIET:  We do recommend a small meal at first, but then you may proceed to your regular diet.  Drink plenty of fluids but you should avoid alcoholic beverages for 24 hours.  ACTIVITY:  You should plan to  take it easy for the rest of today and you should NOT DRIVE or use heavy machinery until tomorrow (because of the sedation medicines used during the test).    FOLLOW UP: Our staff will call the number listed on your records the next business day following your procedure.  We will call around 7:15- 8:00 am to check on you and address any questions or concerns that you may have regarding the information given to you following your procedure. If we do not reach you, we will leave a message.     If any biopsies were taken you will be contacted by phone or by letter within the next 1-3 weeks.  Please call us at [phone] if you have not heard about the biopsies in 3 weeks.    SIGNATURES/CONFIDENTIALITY: You and/or your care partner have signed paperwork which will be entered into your electronic medical record.  These signatures attest to the fact that that the information above on your After Visit Summary has been reviewed and is understood.  Full responsibility of the confidentiality of this discharge information lies with you and/or your care-partner.

## 2023-12-26 NOTE — Progress Notes (Signed)
 Called to room to assist during endoscopic procedure.  Patient ID and intended procedure confirmed with present staff. Received instructions for my participation in the procedure from the performing physician.

## 2023-12-26 NOTE — Progress Notes (Signed)
 Updated medical history with pt

## 2023-12-26 NOTE — Progress Notes (Signed)
 GASTROENTEROLOGY PROCEDURE H&P NOTE   Primary Care Physician: Theophilus Pagan, MD  HPI: Paula Stark is a 33 y.o. female who presents for EGD for evaluation of LUQ pain and GERD.  Past Medical History:  Diagnosis Date   Eczema    Gestational diabetes    Hemorrhoid    Postpartum hemorrhage    Past Surgical History:  Procedure Laterality Date   DILATION AND EVACUATION N/A 04/20/2015   Procedure: DILATATION AND EVACUATION;  Surgeon: Lynwood KANDICE Solomons, MD;  Location: [facility];  Service: Gynecology;  Laterality: N/A;   Current Outpatient Medications  Medication Sig Dispense Refill   Accu-Chek Softclix Lancets lancets Use to check blood sugar 3 times daily 200 each 3   Blood Glucose Monitoring Suppl (ACCU-CHEK GUIDE) w/Device KIT Check blood sugar once daily 1 kit 0   chlorhexidine  (PERIDEX ) 0.12 % solution Use as directed 15 mLs in the mouth or throat 2 (two) times daily. 473 mL 3   [START ON 12/27/2023] conjugated estrogens (PREMARIN) vaginal cream Place 1 Applicatorful vaginally 2 (two) times a week. 42.5 g 4   famotidine  (PEPCID ) 20 MG tablet TAKE 1 TABLET(20 MG) BY MOUTH DAILY 30 tablet 1   glucose blood (ACCU-CHEK GUIDE TEST) test strip Use as instructed up to 3 times per day for blood sugar management 500 each 12   Lancets Misc. (ACCU-CHEK SOFTCLIX LANCET DEV) KIT USE TO CHECK BLOOD SUGAR THREE TIMES DAILY 1 kit 3   metFORMIN  (GLUCOPHAGE ) 500 MG tablet Take 2 tablets (1,000 mg total) by mouth 2 (two) times daily with a meal. 180 tablet 1   methocarbamol (ROBAXIN) 500 MG tablet Take 1 tablet (500 mg total) by mouth at bedtime as needed for muscle spasms. 30 tablet 1   naproxen  (NAPROSYN ) 500 MG tablet Take 1 tablet (500 mg total) by mouth 2 (two) times daily with a meal. 30 tablet 0   pantoprazole  (PROTONIX ) 40 MG tablet Take 1 tablet (40 mg total) by mouth daily. 30 tablet 3   rosuvastatin  (CRESTOR ) 10 MG tablet Take 1 tablet (10 mg total) by mouth daily. 90 tablet 3   No current  facility-administered medications for this visit.    Current Outpatient Medications:    Accu-Chek Softclix Lancets lancets, Use to check blood sugar 3 times daily, Disp: 200 each, Rfl: 3   Blood Glucose Monitoring Suppl (ACCU-CHEK GUIDE) w/Device KIT, Check blood sugar once daily, Disp: 1 kit, Rfl: 0   chlorhexidine  (PERIDEX ) 0.12 % solution, Use as directed 15 mLs in the mouth or throat 2 (two) times daily., Disp: 473 mL, Rfl: 3   [START ON 12/27/2023] conjugated estrogens (PREMARIN) vaginal cream, Place 1 Applicatorful vaginally 2 (two) times a week., Disp: 42.5 g, Rfl: 4   famotidine  (PEPCID ) 20 MG tablet, TAKE 1 TABLET(20 MG) BY MOUTH DAILY, Disp: 30 tablet, Rfl: 1   glucose blood (ACCU-CHEK GUIDE TEST) test strip, Use as instructed up to 3 times per day for blood sugar management, Disp: 500 each, Rfl: 12   Lancets Misc. (ACCU-CHEK SOFTCLIX LANCET DEV) KIT, USE TO CHECK BLOOD SUGAR THREE TIMES DAILY, Disp: 1 kit, Rfl: 3   metFORMIN  (GLUCOPHAGE ) 500 MG tablet, Take 2 tablets (1,000 mg total) by mouth 2 (two) times daily with a meal., Disp: 180 tablet, Rfl: 1   methocarbamol (ROBAXIN) 500 MG tablet, Take 1 tablet (500 mg total) by mouth at bedtime as needed for muscle spasms., Disp: 30 tablet, Rfl: 1   naproxen  (NAPROSYN ) 500 MG tablet, Take 1 tablet (  500 mg total) by mouth 2 (two) times daily with a meal., Disp: 30 tablet, Rfl: 0   pantoprazole  (PROTONIX ) 40 MG tablet, Take 1 tablet (40 mg total) by mouth daily., Disp: 30 tablet, Rfl: 3   rosuvastatin  (CRESTOR ) 10 MG tablet, Take 1 tablet (10 mg total) by mouth daily., Disp: 90 tablet, Rfl: 3 No Known Allergies Family History  Problem Relation Age of Onset   Diabetes Neg Hx    Hypertension Neg Hx    Obesity Neg Hx    Social History   Socioeconomic History   Marital status: Married    Spouse name: Not on file   Number of children: Not on file   Years of education: Not on file   Highest education level: 5th grade  Occupational History    Not on file  Tobacco Use   Smoking status: Never    Passive exposure: Never   Smokeless tobacco: Never  Vaping Use   Vaping status: Never Used  Substance and Sexual Activity   Alcohol use: No   Drug use: No   Sexual activity: Yes    Birth control/protection: None  Other Topics Concern   Not on file  Social History Narrative   Not on file   Social Drivers of Health   Financial Resource Strain: Low Risk  (03/27/2023)   Overall Financial Resource Strain (CARDIA)    Difficulty of Paying Living Expenses: Not hard at all  Food Insecurity: No Food Insecurity (07/19/2023)   Hunger Vital Sign    Worried About Running Out of Food in the Last Year: Never true    Ran Out of Food in the Last Year: Never true  Transportation Needs: No Transportation Needs (07/19/2023)   PRAPARE - Administrator, Civil Service (Medical): No    Lack of Transportation (Non-Medical): No  Physical Activity: Insufficiently Active (03/27/2023)   Exercise Vital Sign    Days of Exercise per Week: 1 day    Minutes of Exercise per Session: 20 min  Stress: No Stress Concern Present (03/27/2023)   [employer] of Occupational Health - Occupational Stress Questionnaire    Feeling of Stress : Only a little  Social Connections: Unknown (03/27/2023)   Social Connection and Isolation Panel    Frequency of Communication with Friends and Family: Twice a week    Frequency of Social Gatherings with Friends and Family: Twice a week    Attends Religious Services: Patient declined    Database administrator or Organizations: No    Attends Engineer, structural: Not on file    Marital Status: Married  Intimate Partner Violence: Unknown (05/17/2022)   Received from [facility]   HITS    Physically Hurt: Not on file    Over the last 12 months how often did your partner insult you or talk down to you?: Never    Over the last 12 months how often did your partner threaten you with physical harm?: Never    Over  the last 12 months how often did your partner scream or curse at you?: Never    Physical Exam: There were no vitals filed for this visit. There is no height or weight on file to calculate BMI. GEN: NAD EYE: Sclerae anicteric ENT: MMM CV: Non-tachycardic GI: Soft, NT/ND NEURO:  Alert & Oriented x 3  Lab Results: No results for input(s): WBC, HGB, HCT, PLT in the last 72 hours. BMET No results for input(s): NA, K, CL, CO2, GLUCOSE,  BUN, CREATININE, CALCIUM  in the last 72 hours. LFT No results for input(s): PROT, ALBUMIN, AST, ALT, ALKPHOS, BILITOT, BILIDIR, IBILI in the last 72 hours. PT/INR No results for input(s): LABPROT, INR in the last 72 hours.   Impression / Plan: This is a 33 y.o.female who presents for EGD for evaluation of LUQ pain and GERD.  The risks and benefits of endoscopic evaluation/treatment were discussed with the patient and/or family; these include but are not limited to the risk of perforation, infection, bleeding, missed lesions, lack of diagnosis, severe illness requiring hospitalization, as well as anesthesia and sedation related illnesses.  The patient's history has been reviewed, patient examined, no change in status, and deemed stable for procedure.  The patient and/or family is agreeable to proceed.    Aloha Finner, MD [facility] Office # [phone]

## 2023-12-26 NOTE — Op Note (Signed)
 [facility] Patient Name: Paula Stark Procedure Date: 12/26/2023 10:21 AM MRN: [id number] Endoscopist: Aloha Finner , MD, [id number] Age: 33 Referring MD:  Date of Birth: 05-10-1990 Gender: Female Account #: [id number] Procedure:                Upper GI endoscopy Indications:              Abdominal pain in the left upper quadrant Medicines:                Monitored Anesthesia Care Procedure:                Pre-Anesthesia Assessment:                           - Prior to the procedure, a History and Physical                            was performed, and patient medications and                            allergies were reviewed. The patient's tolerance of                            previous anesthesia was also reviewed. The risks                            and benefits of the procedure and the sedation                            options and risks were discussed with the patient.                            All questions were answered, and informed consent                            was obtained. Prior Anticoagulants: The patient has                            taken no anticoagulant or antiplatelet agents. ASA                            Grade Assessment: II - A patient with mild systemic                            disease. After reviewing the risks and benefits,                            the patient was deemed in satisfactory condition to                            undergo the procedure.                           After obtaining informed consent, the endoscope was  passed under direct vision. Throughout the                            procedure, the patient's blood pressure, pulse, and                            oxygen saturations were monitored continuously. The                            Olympus Scope [id number] was introduced through the                            mouth, and advanced to the second part of duodenum.                            The  upper GI endoscopy was accomplished without                            difficulty. The patient tolerated the procedure. Scope In: Scope Out: Findings:                 No gross lesions were noted in the entire esophagus.                           The Z-line was irregular and was found 37 cm from                            the incisors.                           Patchy mildly erythematous mucosa without bleeding                            was found in the entire examined stomach. Biopsies                            were taken with a cold forceps for histology and                            Helicobacter pylori testing.                           No gross lesions were noted in the duodenal bulb,                            in the first portion of the duodenum and in the                            second portion of the duodenum. Biopsies were taken                            with a cold forceps for histology. Complications:            No immediate complications. Estimated Blood Loss:  Estimated blood loss was minimal. Impression:               - No gross lesions in the entire esophagus. Z-line                            irregular, 37 cm from the incisors.                           - Erythematous mucosa in the stomach. Biopsied.                           - No gross lesions in the duodenal bulb, in the                            first portion of the duodenum and in the second                            portion of the duodenum. Biopsied. Recommendation:           - The patient will be observed post-procedure,                            until all discharge criteria are met.                           - Discharge patient to home.                           - Patient has a contact number available for                            emergencies. The signs and symptoms of potential                            delayed complications were discussed with the                            patient. Return to normal  activities tomorrow.                            Written discharge instructions were provided to the                            patient.                           - Resume previous diet.                           - Continue present medications.                           - Await pathology results.                           - The findings and recommendations were  discussed                            with the patient.                           - The findings and recommendations were discussed                            with the patient's family. Aloha Finner, MD 12/26/2023 10:43:43 AM

## 2023-12-27 ENCOUNTER — Telehealth: Payer: Self-pay

## 2023-12-27 NOTE — Telephone Encounter (Signed)
  Follow up Call-     12/26/2023    9:52 AM  Call back number  Post procedure Call Back phone  # [phone]  Permission to leave phone message Yes     Patient questions:  Do you have a fever, pain , or abdominal swelling? No. Pain Score  0 *  Have you tolerated food without any problems? Yes.    Have you been able to return to your normal activities? Yes.    Do you have any questions about your discharge instructions: Diet   No. Medications  No. Follow up visit  No.  Do you have questions or concerns about your Care? No.  Actions: * If pain score is 4 or above: No action needed, pain <4.

## 2023-12-28 ENCOUNTER — Ambulatory Visit: Admitting: Family Medicine

## 2024-01-01 LAB — SURGICAL PATHOLOGY

## 2024-01-01 NOTE — Progress Notes (Deleted)
   SUBJECTIVE:   CHIEF COMPLAINT / HPI:   Discussed the use of AI scribe software for clinical note transcription with the patient, who gave verbal consent to proceed.  History of Present Illness    L sided thoracic back pain - ongoing since April, noticed when woke up in the morning.  - CXR 07/2023 and thoracic/rib XR 12/2023 unremarkable. - previously diagnosed with H pylori, completed treatment. PPI with improvement of chest and side pain. EGD with GI completed 10/8, awaiting biopsy results. - Currently doing PT with home exercises with some relief.  *** - worse in morning when wakes up, better with movement. Sleeping on R side with some improvement.  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 - planning to try naproxen  after EGD. Robaxin nightly*** -   OBJECTIVE:   There were no vitals taken for this visit.  Gen: well appearing, in NAD Card: RRR Lungs: CTAB Ext: WWP, no edema ***  ASSESSMENT/PLAN:   No problem-specific Assessment & Plan notes found for this encounter.     Assessment and Plan Assessment & Plan         Donald CHRISTELLA Lai, DO

## 2024-01-02 ENCOUNTER — Ambulatory Visit: Payer: Self-pay | Admitting: Gastroenterology

## 2024-01-02 ENCOUNTER — Ambulatory Visit: Admitting: Family Medicine

## 2024-01-07 ENCOUNTER — Ambulatory Visit: Admitting: Family Medicine

## 2024-01-28 NOTE — Progress Notes (Unsigned)
   SUBJECTIVE:   CHIEF COMPLAINT / HPI:   Discussed the use of AI scribe software for clinical note transcription with the patient, who gave verbal consent to proceed.  History of Present Illness    L sided thoracic back pain - ongoing since April, noticed when woke up in the morning.  - CXR 07/2023 and thoracic/rib XR 12/2023 unremarkable. - previously diagnosed with H pylori, completed treatment. PPI with improvement of chest and side pain. EGD with GI completed 10/8, biopsy with chronic gastritis, H pylori negative. - Currently doing PT with home exercises with some relief.  *** - worse in morning when wakes up, better with movement. Sleeping on R side with some improvement.  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 - planning to try naproxen  after EGD. Robaxin nightly*** -   OBJECTIVE:   There were no vitals taken for this visit.  Gen: well appearing, in NAD Card: RRR Lungs: CTAB Ext: WWP, no edema ***  ASSESSMENT/PLAN:   No problem-specific Assessment & Plan notes found for this encounter.     Assessment and Plan Assessment & Plan         Paula CHRISTELLA Lai, DO

## 2024-01-29 ENCOUNTER — Ambulatory Visit (INDEPENDENT_AMBULATORY_CARE_PROVIDER_SITE_OTHER): Admitting: Family Medicine

## 2024-01-29 ENCOUNTER — Encounter: Payer: Self-pay | Admitting: Family Medicine

## 2024-01-29 DIAGNOSIS — G8929 Other chronic pain: Secondary | ICD-10-CM | POA: Diagnosis not present

## 2024-01-29 DIAGNOSIS — M9902 Segmental and somatic dysfunction of thoracic region: Secondary | ICD-10-CM

## 2024-01-29 DIAGNOSIS — M9900 Segmental and somatic dysfunction of head region: Secondary | ICD-10-CM

## 2024-01-29 DIAGNOSIS — R0683 Snoring: Secondary | ICD-10-CM | POA: Insufficient documentation

## 2024-01-29 DIAGNOSIS — M9901 Segmental and somatic dysfunction of cervical region: Secondary | ICD-10-CM

## 2024-01-29 DIAGNOSIS — K219 Gastro-esophageal reflux disease without esophagitis: Secondary | ICD-10-CM

## 2024-01-29 DIAGNOSIS — M546 Pain in thoracic spine: Secondary | ICD-10-CM | POA: Diagnosis not present

## 2024-01-29 DIAGNOSIS — M9903 Segmental and somatic dysfunction of lumbar region: Secondary | ICD-10-CM | POA: Diagnosis not present

## 2024-01-29 NOTE — Assessment & Plan Note (Signed)
 STOP-BANG 3, discussed risk for OSA, offered sleep study referral, declined.

## 2024-01-29 NOTE — Assessment & Plan Note (Signed)
 Still symptomatic after eating. Reviewed EGD and biopsy results. Has GI follow up next week. Continue PPI, famotidine , consider increase in PPI given gastritis on EGD and remains symptomatic.

## 2024-01-29 NOTE — Assessment & Plan Note (Signed)
 With compensatory somatic dysfunction including hypertonic musculature and fascial strain. OMT performed today with good results, see above. F/u 3-4 weeks for retreatment or sooner if needed. Referred for PT specifically for dry needling, continue massage and stretching exercises previously given.

## 2024-01-29 NOTE — Patient Instructions (Signed)
 It was great to see you!   Our plans for today:  - Place a tennis ball or similar shaped ball at the area of maximum pain and lay on this for at least 30 minutes each night. Lay on it in the morning also if you are able.  - We referred you to physical therapy for dry needling. Let us  know if you don't hear about an appointment in the next few weeks.  - Continue your pantoprazole  and famotidine . Consider increasing your pantoprazole  to twice daily if you are still having pain after eating. - Consider going to a professional massage therapist in between our visits. - Come back in 2-4 weeks for your next treatment as needed.   Take care and seek immediate care sooner if you develop any concerns.    Dr. Laylah Riga

## 2024-02-06 ENCOUNTER — Ambulatory Visit: Admitting: Family Medicine

## 2024-02-06 NOTE — Progress Notes (Deleted)
    SUBJECTIVE:   CHIEF COMPLAINT / HPI:   GERD EGD 10/8 - gastritis but negative for H pylori  PERTINENT  PMH / PSH: ***  OBJECTIVE:   There were no vitals taken for this visit. ***  General: NAD, pleasant, able to participate in exam Cardiac: RRR, no murmurs. Respiratory: CTAB, normal effort, No wheezes, rales or rhonchi Abdomen: Bowel sounds present, nontender, nondistended Extremities: no edema or cyanosis. Skin: warm and dry, no rashes noted Neuro: alert, no obvious focal deficits Psych: Normal affect and mood  ASSESSMENT/PLAN:   No problem-specific Assessment & Plan notes found for this encounter.     Dr. Izetta Nap, DO [facility]    {    This will disappear when note is signed, click to select method of visit    :1}

## 2024-02-07 ENCOUNTER — Ambulatory Visit: Admitting: Gastroenterology

## 2024-02-12 NOTE — Progress Notes (Unsigned)
    SUBJECTIVE:   CHIEF COMPLAINT / HPI:   Diabetes Current Regimen: Metformin  1000mg  daily CBGs: ***  Last A1c:  Lab Results  Component Value Date   HGBA1C 6.4 10/30/2023    Denies polyuria, polydipsia, hypoglycemia *** Last Eye Exam: *** Statin: *** ACE/ARB: ***   PERTINENT  PMH / PSH: T2DM, GERD   OBJECTIVE:   There were no vitals taken for this visit. ***  General: NAD, pleasant, able to participate in exam Cardiac: RRR, no murmurs. Respiratory: CTAB, normal effort, No wheezes, rales or rhonchi Abdomen: Bowel sounds present, nontender, nondistended Extremities: no edema or cyanosis. Skin: warm and dry, no rashes noted Neuro: alert, no obvious focal deficits Psych: Normal affect and mood  ASSESSMENT/PLAN:   No problem-specific Assessment & Plan notes found for this encounter.     Dr. Izetta Nap, DO [facility]    {    This will disappear when note is signed, click to select method of visit    :1}

## 2024-02-13 ENCOUNTER — Ambulatory Visit: Admitting: Family Medicine

## 2024-02-13 ENCOUNTER — Other Ambulatory Visit (HOSPITAL_COMMUNITY): Payer: Self-pay

## 2024-02-13 ENCOUNTER — Telehealth: Payer: Self-pay

## 2024-02-13 VITALS — BP 113/78 | HR 73 | Ht 62.0 in | Wt 181.2 lb

## 2024-02-13 DIAGNOSIS — M546 Pain in thoracic spine: Secondary | ICD-10-CM

## 2024-02-13 DIAGNOSIS — E119 Type 2 diabetes mellitus without complications: Secondary | ICD-10-CM | POA: Diagnosis present

## 2024-02-13 DIAGNOSIS — G8929 Other chronic pain: Secondary | ICD-10-CM

## 2024-02-13 DIAGNOSIS — K219 Gastro-esophageal reflux disease without esophagitis: Secondary | ICD-10-CM

## 2024-02-13 LAB — POCT GLYCOSYLATED HEMOGLOBIN (HGB A1C): HbA1c, POC (controlled diabetic range): 7.3 % — AB (ref 0.0–7.0)

## 2024-02-13 MED ORDER — EMPAGLIFLOZIN 10 MG PO TABS: 10.0000 mg | ORAL_TABLET | Freq: Every day | ORAL | 1 refills | Status: AC

## 2024-02-13 MED ORDER — FAMOTIDINE 20 MG PO TABS: 20.0000 mg | ORAL_TABLET | Freq: Every day | ORAL | 1 refills | Status: DC

## 2024-02-13 NOTE — Patient Instructions (Addendum)
 It was wonderful to see you today! Thank you for choosing [facility].   Please bring ALL of your medications with you to every visit.   Today we talked about:  Diabetes your A1c is 7.3, this is increased from prior testing likely since you did not tolerate the metformin .  Since it went up a little bit I think focusing on your diet and limiting the amount of rice and bread that you intake can help your sugar overall.  We are going to add on a medication called Jardiance  that you will take 1 time per day.  If you develop any urinary tract infections or vaginal symptoms please let me know as this could be a potential side effect.  Will repeat your A1c in 3 months to see how it is improving. For your reflux symptoms I did refill the famotidine  which is safe to take in the afternoon as needed.  You can continue on the pantoprazole  for now as we get your gastritis better controlled.  Ideally after your symptoms have improved in 6 months to a year we can get you off the medication altogether. You are referred to physical therapy for dry needling which we discussed is similar to acupuncture but localized to the area of issue, I will reach out to our referral team to check on this since it has been 2 weeks since that referral was placed.  I would try at least a couple of sessions to see if it helps with your pain and continue to do your home stretching exercises and pain regimen with Tylenol  as needed.  Please follow up in 3 months   Call the clinic at [phone] if your symptoms worsen or you have any concerns.  Please be sure to schedule follow up at the front desk before you leave today.   Izetta Nap, DO Family Medicine

## 2024-02-13 NOTE — Assessment & Plan Note (Signed)
 Reviewed endoscopy report findings.  Symptoms currently stable on PPI and famotidine . -Refill famotidine  20 mg daily as needed

## 2024-02-13 NOTE — Assessment & Plan Note (Signed)
 A1c 7.3, increased from 6.4 in 10/2023.  Has been unable to tolerate metformin  due to side effects even at low doses.  Previously unable to tolerate Ozempic  therefore will trial SGLT2 therapy blood sugar management. -Start Jardiance  10 mg daily -Repeat A1c in 3 months

## 2024-02-13 NOTE — Telephone Encounter (Signed)
 Prior authorization submitted for JARDIANCE  to [facility] MEDICAID via Latent.   Key: [id number]

## 2024-02-13 NOTE — Assessment & Plan Note (Signed)
 Seen in OMT clinic on 11/11, some improvement symptoms were referred for PT for consideration of dry needling.  Referral still in process, will follow-up with referral team.

## 2024-02-15 NOTE — Telephone Encounter (Signed)
 Pharmacy Patient Advocate Encounter  Received notification from [facility] MEDICAID that Prior Authorization for JARDIANCE  has been APPROVED from 02/13/24 to 02/13/24   PA #/Case ID/Reference #: [id number]

## 2024-03-25 ENCOUNTER — Ambulatory Visit: Admitting: Gastroenterology

## 2024-03-25 ENCOUNTER — Encounter: Payer: Self-pay | Admitting: Gastroenterology

## 2024-03-25 DIAGNOSIS — K219 Gastro-esophageal reflux disease without esophagitis: Secondary | ICD-10-CM | POA: Diagnosis not present

## 2024-03-25 DIAGNOSIS — K2970 Gastritis, unspecified, without bleeding: Secondary | ICD-10-CM

## 2024-03-25 MED ORDER — FAMOTIDINE 20 MG PO TABS: 20.0000 mg | ORAL_TABLET | Freq: Two times a day (BID) | ORAL | 3 refills | Status: AC

## 2024-03-25 NOTE — Patient Instructions (Signed)
 We have sent the following medications to your pharmacy for you to pick up at your convenience: Increase Famotidine  20 mg twice daily.   Follow up in 1 year or sooner if needed.    _______________________________________________________  If your blood pressure at your visit was 140/90 or greater, please contact your primary care physician to follow up on this.  _______________________________________________________  If you are age 36 or older, your body mass index should be between 23-30. Your Body mass index is 33.2 kg/m. If this is out of the aforementioned range listed, please consider follow up with your Primary Care Provider.  If you are age 46 or younger, your body mass index should be between 19-25. Your Body mass index is 33.2 kg/m. If this is out of the aformentioned range listed, please consider follow up with your Primary Care Provider.   ________________________________________________________  The [facility] providers would like to encourage you to use MYCHART to communicate with providers for non-urgent requests or questions.  Due to long hold times on the telephone, sending your provider a message by [facility] may be a faster and more efficient way to get a response.  Please allow 48 business hours for a response.  Please remember that this is for non-urgent requests.  _______________________________________________________  [facility] is using a team-based approach to care.  Your team is made up of your doctor and two to three APPS. Our APPS (Nurse Practitioners and Physician Assistants) work with your physician to ensure care continuity for you. They are fully qualified to address your health concerns and develop a treatment plan. They communicate directly with your gastroenterologist to care for you. Seeing the Advanced Practice Practitioners on your physician's team can help you by facilitating care more promptly, often allowing for earlier appointments, access  to diagnostic testing, procedures, and other specialty referrals. ,

## 2024-03-25 NOTE — Progress Notes (Unsigned)
 "    03/25/2024 Paula Stark [id number] [birth year]   Discussed the use of AI scribe software for clinical note transcription with the patient, who gave verbal consent to proceed.  History of Present Illness Paula Stark is a 34 year old female with gastritis, gastroesophageal reflux disease, and anal stricture who presents for follow-up after recent endoscopic evaluation.  Recent endoscopy was performed; the patient was informed that there was irritation in the stomach lining. Biopsies were obtained and Helicobacter pylori testing was negative; she had previously completed treatment for H. pylori earlier this year. She reports feeling generally okay and somewhat improved compared to her prior visit. Intermittent epigastric pain and burning persist, triggered by spicy or hot foods and described as coming and going. Famotidine  is taken once daily; pantoprazole  was discontinued two weeks ago due to lack of additional benefit. Recent CT scan and blood work were unremarkable.  History of hemorrhoid banding and subsequent surgical removal at [facility] two to three years ago. Postoperatively, she developed a sensation of narrowing and pain at the surgical site, with episodes of thin, small-caliber stools and difficulty with defecation. Two dilation procedures and Botox injections for anal stricture provided temporary improvement for several months before symptoms recurred. Ongoing symptoms include sensation of narrowing, pain, and difficulty passing stool, requiring her to keep stool very soft to avoid impaction. She notes her daughter is able to pass larger stools than she can.  Intermittent muscular back pain is present, distinct from her stomach pain, and sometimes radiates with increased intensity. She also reports persistent nerve pain radiating from her scapula down her leg, associated with the period after hemorrhoid surgery and subsequent interventions, without improvement.    EGD 12/2023: - No gross lesions  in the entire esophagus. Z- line irregular, 37 cm from the incisors. - Erythematous mucosa in the stomach. Biopsied. - No gross lesions in the duodenal bulb, in the first portion of the duodenum and in the second portion of the duodenum. Biopsied.    1. Surgical [P], gastric :      - GASTRIC ANTRAL AND OXYNTIC MUCOSA WITH CHRONIC GASTRITIS, SEE NOTE       2. Surgical [P], duodenal :      - DUODENAL MUCOSA WITH NO SPECIFIC HISTOPATHOLOGIC CHANGES      - NEGATIVE FOR INCREASED INTRAEPITHELIAL LYMPHOCYTES OR VILLOUS ARCHITECTURAL      CHANGES       Diagnosis Note : Immunohistochemical stain for Helicobacter pylori is pending      and will be reported in an addendum.      Diagnosis Note : - Duodenal mucosa with no specific histopathologic changes      - Negative for increased intraepithelial lymphocytes or villous architectural      changes      - Duodenal mucosa with no specific histopathologic changes      - Negative for increased intraepithelial lymphocytes or villous architectural      changes  ADDENDUM  Immunohistochemical stain follicular-type pylori is negative.    Past Medical History:  Diagnosis Date   Anxiety    Eczema    GERD (gastroesophageal reflux disease)    Gestational diabetes    Hemorrhoid    Hyperlipidemia    Postpartum hemorrhage    Past Surgical History:  Procedure Laterality Date   BAND HEMORRHOIDECTOMY     DILATION AND EVACUATION N/A 04/20/2015   Procedure: DILATATION AND EVACUATION;  Surgeon: Lynwood KANDICE Solomons, MD;  Location: [facility];  Service: Gynecology;  Laterality: N/A;  ESOPHAGOGASTRODUODENOSCOPY      reports that she has never smoked. She has never been exposed to tobacco smoke. She has never used smokeless tobacco. She reports that she does not drink alcohol and does not use drugs. family history is not on file. Allergies[1]    Outpatient Encounter Medications as of 03/25/2024  Medication Sig   Accu-Chek Softclix Lancets lancets Use to check blood  sugar 3 times daily   Blood Glucose Monitoring Suppl (ACCU-CHEK GUIDE) w/Device KIT Check blood sugar once daily   chlorhexidine  (PERIDEX ) 0.12 % solution Use as directed 15 mLs in the mouth or throat 2 (two) times daily.   empagliflozin  (JARDIANCE ) 10 MG TABS tablet Take 1 tablet (10 mg total) by mouth daily.   famotidine  (PEPCID ) 20 MG tablet Take 1 tablet (20 mg total) by mouth daily.   glucose blood (ACCU-CHEK GUIDE TEST) test strip Use as instructed up to 3 times per day for blood sugar management   Lancets Misc. (ACCU-CHEK SOFTCLIX LANCET DEV) KIT USE TO CHECK BLOOD SUGAR THREE TIMES DAILY   pantoprazole  (PROTONIX ) 40 MG tablet Take 1 tablet (40 mg total) by mouth daily.   rosuvastatin  (CRESTOR ) 10 MG tablet Take 1 tablet (10 mg total) by mouth daily.   conjugated estrogens  (PREMARIN ) vaginal cream Place 1 Applicatorful vaginally 2 (two) times a week. (Patient not taking: Reported on 03/25/2024)   methocarbamol  (ROBAXIN ) 500 MG tablet Take 1 tablet (500 mg total) by mouth at bedtime as needed for muscle spasms. (Patient not taking: Reported on 03/25/2024)   naproxen  (NAPROSYN ) 500 MG tablet Take 1 tablet (500 mg total) by mouth 2 (two) times daily with a meal. (Patient not taking: Reported on 03/25/2024)   No facility-administered encounter medications on file as of 03/25/2024.     REVIEW OF SYSTEMS  : All other systems reviewed and negative except where noted in the History of Present Illness.   PHYSICAL EXAM: BP (!) 94/52   Pulse 62   Ht 5' 2 (1.575 m)   Wt 181 lb 8 oz (82.3 kg)   LMP  (LMP Unknown)   BMI 33.20 kg/m  General: Well developed white female in no acute distress Head: Normocephalic and atraumatic Eyes:  sclerae anicteric,conjunctive pink. Ears: Normal auditory acuity Neck: Supple, no masses.  Lungs: Clear throughout to auscultation Heart: Regular rate and rhythm Abdomen: Soft, nontender, non distended. No masses or hepatomegaly noted. Normal bowel sounds Rectal:  *** Musculoskeletal: Symmetrical with no gross deformities  Skin: No lesions on visible extremities Extremities: No edema  Neurological: Alert oriented x 4, grossly nonfocal Cervical Nodes:  No significant cervical adenopathy Psychological:  Alert and cooperative. Normal mood and affect  Assessment & Plan Gastritis and gastroesophageal reflux disease Chronic, intermittently symptomatic acid-related disorder with mild improvement on current therapy. No evidence of complications or alarming features on recent evaluation. Long-term famotidine  is preferred due to minimal side effects. Symptoms are likely to persist intermittently, particularly with dietary triggers. - Refilled famotidine  prescription and increased dosing to twice daily for enhanced acid suppression. - Advised to continue current famotidine  regimen until new prescription is available. - Discussed long-term safety profiles of famotidine  and pantoprazole ; recommended famotidine  for ongoing management. - Provided anticipatory guidance regarding the chronic nature of acid-related symptoms and the likelihood of ongoing or intermittent therapy. - Advised avoidance of dietary triggers, including spicy and hot foods, to minimize symptom recurrence. - Instructed to maintain annual follow-up for medication refills unless managed by her primary care provider. - Educated that  routine repeat endoscopy is unnecessary unless new or worsening symptoms develop. - Advised to contact the office or nurse for acute issues or to arrange earlier appointments as needed.      CC:  Theophilus Pagan, MD        [1] No Known Allergies  "

## 2024-03-26 ENCOUNTER — Encounter: Payer: Self-pay | Admitting: Gastroenterology

## 2024-03-27 NOTE — Progress Notes (Signed)
 Attending Physician's Attestation   I have reviewed the chart.   I agree with the Advanced Practitioner's note, impression, and recommendations with any updates as below.    Corliss Parish, MD [facility] Office # [phone]

## 2024-04-25 ENCOUNTER — Other Ambulatory Visit: Payer: Self-pay | Admitting: Family Medicine

## 2024-04-25 DIAGNOSIS — K649 Unspecified hemorrhoids: Secondary | ICD-10-CM

## 2024-05-02 ENCOUNTER — Ambulatory Visit: Payer: Self-pay | Admitting: Family Medicine
